# Patient Record
Sex: Female | Born: 1950
Health system: Southern US, Community
[De-identification: ages and names within clinical notes are randomized; demographics above are authoritative.]

## PROBLEM LIST (undated history)

## (undated) DIAGNOSIS — I10 Essential (primary) hypertension: Secondary | ICD-10-CM

## (undated) DIAGNOSIS — E669 Obesity, unspecified: Secondary | ICD-10-CM

## (undated) DIAGNOSIS — T7840XA Allergy, unspecified, initial encounter: Secondary | ICD-10-CM

## (undated) DIAGNOSIS — K5790 Diverticulosis of intestine, part unspecified, without perforation or abscess without bleeding: Secondary | ICD-10-CM

## (undated) DIAGNOSIS — E785 Hyperlipidemia, unspecified: Secondary | ICD-10-CM

## (undated) DIAGNOSIS — E66811 Obesity, class 1: Secondary | ICD-10-CM

## (undated) HISTORY — PX: COSMETIC SURGERY: SHX468

## (undated) HISTORY — DX: Obesity, unspecified: E66.9

## (undated) HISTORY — DX: Essential (primary) hypertension: I10

## (undated) HISTORY — DX: Diverticulosis of intestine, part unspecified, without perforation or abscess without bleeding: K57.90

## (undated) HISTORY — PX: ABDOMINAL HYSTERECTOMY: SHX81

## (undated) HISTORY — PX: APPENDECTOMY: SHX54

## (undated) HISTORY — DX: Hyperlipidemia, unspecified: E78.5

## (undated) HISTORY — DX: Obesity, class 1: E66.811

## (undated) HISTORY — PX: TUBAL LIGATION: SHX77

## (undated) HISTORY — DX: Allergy, unspecified, initial encounter: T78.40XA

---

## 2004-03-25 ENCOUNTER — Ambulatory Visit: Payer: Self-pay | Admitting: Internal Medicine

## 2004-07-15 ENCOUNTER — Encounter: Payer: Self-pay | Admitting: General Practice

## 2004-07-30 ENCOUNTER — Encounter: Payer: Self-pay | Admitting: General Practice

## 2004-08-30 ENCOUNTER — Encounter: Payer: Self-pay | Admitting: General Practice

## 2004-09-29 ENCOUNTER — Encounter: Payer: Self-pay | Admitting: General Practice

## 2004-10-30 ENCOUNTER — Encounter: Payer: Self-pay | Admitting: General Practice

## 2004-11-29 ENCOUNTER — Encounter: Payer: Self-pay | Admitting: General Practice

## 2005-04-02 ENCOUNTER — Ambulatory Visit: Payer: Self-pay

## 2006-05-13 ENCOUNTER — Ambulatory Visit: Payer: Self-pay

## 2006-06-01 HISTORY — PX: ENDOVENOUS ABLATION SAPHENOUS VEIN W/ LASER: SUR449

## 2007-05-19 ENCOUNTER — Ambulatory Visit: Payer: Self-pay

## 2007-12-20 ENCOUNTER — Ambulatory Visit: Payer: Self-pay | Admitting: Internal Medicine

## 2008-08-07 ENCOUNTER — Ambulatory Visit: Payer: Self-pay

## 2009-01-31 ENCOUNTER — Ambulatory Visit: Payer: Self-pay | Admitting: Internal Medicine

## 2009-09-18 ENCOUNTER — Ambulatory Visit: Payer: Self-pay

## 2010-02-06 ENCOUNTER — Ambulatory Visit: Payer: Self-pay | Admitting: Internal Medicine

## 2010-02-25 ENCOUNTER — Ambulatory Visit: Payer: Self-pay | Admitting: Internal Medicine

## 2010-11-15 LAB — HM PAP SMEAR

## 2011-01-23 ENCOUNTER — Ambulatory Visit: Payer: Self-pay | Admitting: Internal Medicine

## 2011-02-03 ENCOUNTER — Ambulatory Visit (INDEPENDENT_AMBULATORY_CARE_PROVIDER_SITE_OTHER): Payer: PRIVATE HEALTH INSURANCE | Admitting: Internal Medicine

## 2011-02-03 ENCOUNTER — Encounter: Payer: Self-pay | Admitting: Internal Medicine

## 2011-02-03 VITALS — BP 128/86 | HR 66 | Temp 97.8°F | Resp 18 | Ht 65.0 in | Wt 202.5 lb

## 2011-02-03 DIAGNOSIS — R7989 Other specified abnormal findings of blood chemistry: Secondary | ICD-10-CM

## 2011-02-03 DIAGNOSIS — R945 Abnormal results of liver function studies: Secondary | ICD-10-CM

## 2011-02-03 DIAGNOSIS — Z1239 Encounter for other screening for malignant neoplasm of breast: Secondary | ICD-10-CM | POA: Insufficient documentation

## 2011-02-03 DIAGNOSIS — E669 Obesity, unspecified: Secondary | ICD-10-CM

## 2011-02-03 DIAGNOSIS — E785 Hyperlipidemia, unspecified: Secondary | ICD-10-CM | POA: Insufficient documentation

## 2011-02-03 DIAGNOSIS — K760 Fatty (change of) liver, not elsewhere classified: Secondary | ICD-10-CM | POA: Insufficient documentation

## 2011-02-03 DIAGNOSIS — Z1211 Encounter for screening for malignant neoplasm of colon: Secondary | ICD-10-CM | POA: Insufficient documentation

## 2011-02-03 NOTE — Assessment & Plan Note (Signed)
She has had no prior screening.  Will send to GI pending discussion of need for concurrent liver biopsy

## 2011-02-03 NOTE — Assessment & Plan Note (Addendum)
She has a persistently elevated GGT despite stopping statin one year ago.  Referral to Dr. Coral Ceo at [facility] made last August was cancelled for unclear reasons.  Given her strong FH of CAD and NASH, will refer for definitive evaluation as her hyperlipidemia is currently untreated. [facility] is of out of network as is [facility], so will check with Dr. Mechele Collin or Dr. Niel Hummer.

## 2011-02-03 NOTE — Patient Instructions (Signed)
Consider MediFast (online) for weight loss We will aslk Dr Mechele Collin and Dr Niel Hummer re colonoscopy and liver biopsy We will schedule you for a treadmill stress test with Dr. . Dossie Arbour.

## 2011-02-03 NOTE — Assessment & Plan Note (Signed)
Mammogram was done last month and normal.  Patient does monthly breast exams.

## 2011-02-04 DIAGNOSIS — E669 Obesity, unspecified: Secondary | ICD-10-CM | POA: Insufficient documentation

## 2011-02-04 NOTE — Progress Notes (Signed)
  Subjective:    Patient ID: Lisa Green, female    DOB: 07-08-1950, 60 y.o.   MRN: [id number]  HPI 60 yo healthy female here for annual exam.  ving nightly menopausal hot flashes, difficulty with achieiving weight loss despite walking 2 miles daily with husband and following a sensible starch restrHaicted diet.    Past Medical History  Diagnosis Date  . Hyperlipidemia   :    Review of Systems  Constitutional: Negative for fever, chills and unexpected weight change.  HENT: Negative for hearing loss, ear pain, nosebleeds, congestion, sore throat, facial swelling, rhinorrhea, sneezing, mouth sores, trouble swallowing, neck pain, neck stiffness, voice change, postnasal drip, sinus pressure, tinnitus and ear discharge.   Eyes: Negative for pain, discharge, redness and visual disturbance.  Respiratory: Negative for cough, chest tightness, shortness of breath, wheezing and stridor.   Cardiovascular: Negative for chest pain, palpitations and leg swelling.  Musculoskeletal: Negative for myalgias and arthralgias.  Skin: Negative for color change and rash.  Neurological: Negative for dizziness, weakness, light-headedness and headaches.  Hematological: Negative for adenopathy.       Objective:   Physical Exam  Constitutional: She is oriented to person, place, and time. She appears well-developed and well-nourished.  HENT:  Mouth/Throat: Oropharynx is clear and moist.  Eyes: EOM are normal. Pupils are equal, round, and reactive to light. No scleral icterus.  Neck: Normal range of motion. Neck supple. No JVD present. No thyromegaly present.  Cardiovascular: Normal rate, regular rhythm, normal heart sounds and intact distal pulses.   Pulmonary/Chest: Effort normal and breath sounds normal.  Abdominal: Soft. Bowel sounds are normal. She exhibits no mass. There is no tenderness.  Musculoskeletal: Normal range of motion. She exhibits no edema.  Lymphadenopathy:    She has no cervical adenopathy.    Neurological: She is alert and oriented to person, place, and time.  Skin: Skin is warm and dry.  Psychiatric: She has a normal mood and affect.          Assessment & Plan:

## 2011-02-04 NOTE — Assessment & Plan Note (Signed)
disacussed her current diet and exercise level. And rfeviewed recent labs.  She has no evidence of diabetes and eats a sensible diet low in starches.  Her exercise is not sufficient to achieve a negative calorie balance.  Recommended Medifast diet and aerobic exercise.

## 2011-02-26 ENCOUNTER — Encounter: Payer: Self-pay | Admitting: Internal Medicine

## 2011-03-06 ENCOUNTER — Ambulatory Visit (INDEPENDENT_AMBULATORY_CARE_PROVIDER_SITE_OTHER): Payer: PRIVATE HEALTH INSURANCE | Admitting: Cardiovascular Disease

## 2011-03-06 ENCOUNTER — Encounter: Payer: Self-pay | Admitting: Cardiovascular Disease

## 2011-03-06 VITALS — BP 138/98 | HR 67 | Ht 65.0 in | Wt 202.0 lb

## 2011-03-06 DIAGNOSIS — E669 Obesity, unspecified: Secondary | ICD-10-CM

## 2011-03-06 DIAGNOSIS — Z0000 Encounter for general adult medical examination without abnormal findings: Secondary | ICD-10-CM

## 2011-03-06 DIAGNOSIS — E785 Hyperlipidemia, unspecified: Secondary | ICD-10-CM

## 2011-03-06 MED ORDER — ASPIRIN EC 81 MG PO TBEC: 81.0000 mg | DELAYED_RELEASE_TABLET | Freq: Every day | ORAL | Status: AC

## 2011-03-06 MED ORDER — ROSUVASTATIN CALCIUM 5 MG PO TABS: 5.0000 mg | ORAL_TABLET | Freq: Every day | ORAL | Status: DC

## 2011-03-06 NOTE — Progress Notes (Signed)
   Patient ID: Lisa Green, female    DOB: June 25, 1950, 60 y.o.   MRN: [id number]  HPI Comments: Her present 60 year old woman, patient of Dr. Darrick Huntsman, with history of mild obesity, hyperlipidemia, and a very strong family history of coronary artery disease with father having his first heart attack at age 50 as well as a stroke, brother with an MI coronary artery disease as well as many other relatives, who presents by referral for evaluation for hyperlipidemia. history of mild obesity, hyperlipidemia, and a very strong family history of coronary artery disease with father having his first heart attack at age 50 as well as a stroke, brother with an MI coronary artery disease as well as many other relatives, who presents by referral for evaluation for hyperlipidemia.  She reports that she is very active, uses the treadmill frequently, is able to walk a very high rate/speed and incline with no significant symptoms of shortness of breath or chest pain.  She does state taking simvastatin for 8 months but stopped early in 2011.  She reports that her liver enzymes were normal prior to the start of simvastatin, including a normal GGT. While on simvastatin, her GGT on and has not improved since she has held the medication for over one year. There has been some discussion of NASH.  EKG shows normal sinus rhythm with rate 67 beats per minute with nonspecific T wave abnormality in V4 through V6   Outpatient Encounter Prescriptions as of 03/06/2011  Medication Sig Dispense Refill  . aspirin EC 81 MG tablet Take 1 tablet (81 mg total) by mouth daily.  150 tablet  2    Review of Systems  Constitutional: Negative.   HENT: Negative.   Eyes: Negative.   Respiratory: Negative.   Cardiovascular: Negative.   Gastrointestinal: Negative.   Musculoskeletal: Negative.   Skin: Negative.   Neurological: Negative.   Hematological: Negative.   Psychiatric/Behavioral: Negative.   All other systems reviewed and are negative.    BP 138/98  Pulse 67  Ht 5\' 5"  (1.651 m)  Wt 202 lb (91.627 kg)  BMI 33.61 kg/m2  Physical Exam  Nursing note and vitals reviewed. Constitutional: She is oriented to person, place, and time. She appears well-developed and well-nourished.       Mildly obese  HENT:  Head: Normocephalic.    Nose: Nose normal.  Mouth/Throat: Oropharynx is clear and moist.  Eyes: Conjunctivae are normal. Pupils are equal, round, and reactive to light.  Neck: Normal range of motion. Neck supple. No JVD present.  Cardiovascular: Normal rate, regular rhythm, S1 normal, S2 normal, normal heart sounds and intact distal pulses.  Exam reveals no gallop and no friction rub.   No murmur heard. Pulmonary/Chest: Effort normal and breath sounds normal. No respiratory distress. She has no wheezes. She has no rales. She exhibits no tenderness.  Abdominal: Soft. Bowel sounds are normal. She exhibits no distension. There is no tenderness.  Musculoskeletal: Normal range of motion. She exhibits no edema and no tenderness.  Lymphadenopathy:    She has no cervical adenopathy.  Neurological: She is alert and oriented to person, place, and time. Coordination normal.  Skin: Skin is warm and dry. No rash noted. No erythema.  Psychiatric: She has a normal mood and affect. Her behavior is normal. Judgment and thought content normal.         Assessment and Plan

## 2011-03-06 NOTE — Assessment & Plan Note (Signed)
We have encouraged continued exercise, careful diet management in an effort to lose weight. 

## 2011-03-06 NOTE — Patient Instructions (Signed)
You are doing well. Please start crestor 5 mg daily Dr. Darrick Huntsman can check your labs in 3 months Please call us if you have new issues that need to be addressed before your next appt.  We will call you for a follow up Appt. In 12 months

## 2011-03-06 NOTE — Assessment & Plan Note (Signed)
Patient reports having a significant family history of coronary artery disease. She does have impressive hyperlipidemia. Her GGT has remained elevated despite holding the simvastatin since early 2011. We have suggested that this could be from underlying NASH and that we retry a statin. We have suggested she try Crestor 5 mg daily and we've given her samples. I have recommended that we closely monitor her her liver function tests, particularly AST/ALT.   I've asked her to work on her weight and closely monitor her diet.

## 2011-03-06 NOTE — Assessment & Plan Note (Addendum)
We spent some time talking about her current risk given her strong family history. She reports that she is able to exercise well with no symptoms of shortness of breath or chest pain. We did discuss whether she would like to perform a treadmill study. She would like to hold off as she does not feel it will give [place] much information as she treadmill study relatively high level currently with no symptoms. I suggested that this would be okay to do know that she should contact us immediately if she has any worsening shortness of breath or chest tightness with exertion and we would perform a stress test.   We did recommend starting aspirin 81 mg daily.

## 2011-03-09 LAB — HM MAMMOGRAPHY: HM Mammogram: NORMAL

## 2011-04-10 ENCOUNTER — Other Ambulatory Visit: Payer: Self-pay | Admitting: Cardiovascular Disease

## 2011-04-10 DIAGNOSIS — E785 Hyperlipidemia, unspecified: Secondary | ICD-10-CM

## 2011-04-10 MED ORDER — ROSUVASTATIN CALCIUM 5 MG PO TABS: 5.0000 mg | ORAL_TABLET | Freq: Every day | ORAL | Status: DC

## 2011-04-10 NOTE — Telephone Encounter (Signed)
Refill sent for crestor 5 mg  

## 2011-05-20 ENCOUNTER — Ambulatory Visit: Payer: Self-pay | Admitting: Gastroenterology

## 2011-06-02 DIAGNOSIS — K5790 Diverticulosis of intestine, part unspecified, without perforation or abscess without bleeding: Secondary | ICD-10-CM

## 2011-06-02 HISTORY — DX: Diverticulosis of intestine, part unspecified, without perforation or abscess without bleeding: K57.90

## 2011-06-05 ENCOUNTER — Ambulatory Visit: Payer: Self-pay | Admitting: Gastroenterology

## 2011-06-12 ENCOUNTER — Encounter: Payer: Self-pay | Admitting: Internal Medicine

## 2011-08-04 ENCOUNTER — Encounter: Payer: Self-pay | Admitting: Internal Medicine

## 2011-09-07 ENCOUNTER — Encounter: Payer: Self-pay | Admitting: Internal Medicine

## 2012-03-08 ENCOUNTER — Ambulatory Visit: Payer: PRIVATE HEALTH INSURANCE | Admitting: Internal Medicine

## 2012-03-16 ENCOUNTER — Encounter: Payer: Self-pay | Admitting: Internal Medicine

## 2012-03-16 ENCOUNTER — Ambulatory Visit (INDEPENDENT_AMBULATORY_CARE_PROVIDER_SITE_OTHER): Payer: PRIVATE HEALTH INSURANCE | Admitting: Internal Medicine

## 2012-03-16 VITALS — BP 118/68 | HR 75 | Temp 98.7°F | Ht 65.5 in | Wt 200.0 lb

## 2012-03-16 DIAGNOSIS — R7989 Other specified abnormal findings of blood chemistry: Secondary | ICD-10-CM

## 2012-03-16 DIAGNOSIS — Z1211 Encounter for screening for malignant neoplasm of colon: Secondary | ICD-10-CM

## 2012-03-16 DIAGNOSIS — Z1239 Encounter for other screening for malignant neoplasm of breast: Secondary | ICD-10-CM

## 2012-03-16 DIAGNOSIS — E669 Obesity, unspecified: Secondary | ICD-10-CM

## 2012-03-16 DIAGNOSIS — E785 Hyperlipidemia, unspecified: Secondary | ICD-10-CM

## 2012-03-16 MED ORDER — ROSUVASTATIN CALCIUM 5 MG PO TABS: 5.0000 mg | ORAL_TABLET | Freq: Every day | ORAL | Status: DC

## 2012-03-16 NOTE — Progress Notes (Signed)
Patient ID: Lisa Green, female   DOB: Dec 08, 1950, 61 y.o.   MRN: [id number] Patient Active Problem List  Diagnosis  . Screening for breast cancer  . Screening for colon cancer  . Elevated liver function tests  . Hyperlipidemia LDL goal < 100  . Obesity (BMI 30-39.9)  . Visit for preventive health examination  . Obesity (BMI 30.0-34.9)    Subjective:  CC:   Chief Complaint  Patient presents with  . Follow-up    HPI:   Lisa Green a 61 y.o. female who presents for her annnual exam. At her last visit one year ago she was noted to develop elevated liver tests despite stopping her statin .  She was referred to GI for evaluation of fatty liver suspected by ultrasound  and medical history. She was referred to Dr. Mariah Milling  forrisk stratification given her strong family history coronary artery disease.  Dr. Mariah Milling treated with Crestor and repeat lipids and LFTs have normalized by recent laboratory evaluation. Given the normalization of her liver function tests she did not follow through with the GI evaluation for NASH.  She feels great. She is following her low glycemic index diet less closely since she is now a statin and continues to struggle with her weight.   Past Medical History  Diagnosis Date  . Hyperlipidemia   . Diverticulosis 2013    colonoscopy, [place]  . Obesity (BMI 30.0-34.9)     Past Surgical History  Procedure Date  . Cesarean section 1981    Lost Baby  . Cesarean section 1983  . Abdominal hysterectomy     with wedge resection of right ovary , normal path  . Cosmetic surgery   . Endovenous ablation saphenous vein w/ laser 2008    for varicose veins,  [place], [place]         The following portions of the patient's history were reviewed and updated as appropriate: Allergies, current medications, and problem list.    Review of Systems:   12 Pt  review of systems was negative except those addressed in the HPI,     History   Social History  . Marital  Status: Married    Spouse Name: N/A    Number of Children: N/A  . Years of Education: N/A   Occupational History  . Teacher, adult education     OT at [employer]   Social History Main Topics  . Smoking status: Never Smoker   . Smokeless tobacco: Never Used  . Alcohol Use: Yes     2 oz red wine daily  . Drug Use: No  . Sexually Active: Not on file   Other Topics Concern  . Not on file   Social History Narrative  . No narrative on file    Objective:  BP 118/68  Pulse 75  Temp 98.7 F (37.1 C) (Oral)  Ht 5' 5.5" (1.664 m)  Wt 200 lb (90.719 kg)  BMI 32.78 kg/m2  SpO2 97%  General appearance: alert, cooperative and appears stated age Neck: no adenopathy, no carotid bruit, supple, symmetrical, trachea midline and thyroid not enlarged, symmetric, no tenderness/mass/nodules Back: symmetric, no curvature. ROM normal. No CVA tenderness. Lungs: clear to auscultation bilaterally Heart: regular rate and rhythm, S1, S2 normal, no murmur, click, rub or gallop Abdomen: soft, non-tender; bowel sounds normal; no masses,  no organomegaly Pulses: 2+ and symmetric Skin: Skin color, texture, turgor normal. No rashes or lesions   Assessment and Plan:  Obesity (BMI 30-39.9) I have addressed  BMI and recommended a low glycemic index diet utilizing smaller more frequent meals to increase metabolism.  I have also recommended that patient start exercising with a goal of 30 minutes of aerobic exercise a minimum of 5 days per week. Screening for lipid disorders, thyroid and diabetes to be done today.    Elevated liver function tests First elevation was noted in July 2011 while taking a statin.  FH of cirrhosis secondary to NASH in younger brother, who has also recently suffered an MI, I referred her to Dr. Mechele Collin for liver biopsy and screening colonoscopy. She did not keep that appointment. She has had a colonoscopy but her liver function tests have normalized with treatment of her hyperlipidemia  so she has deferred GI evaluation. She will repeat her liver function tests in January.   Screening for colon cancer  Normal colonoscopy January 2013 by Dr. Filbert Schilder.  Screening for breast cancer She's getting a mammogram on Monday, October 21. She is up-to-date with annual screenings and has no history of breast cancer.   Updated Medication List Outpatient Encounter Prescriptions as of 03/16/2012  Medication Sig Dispense Refill  . rosuvastatin (CRESTOR) 5 MG tablet Take 1 tablet (5 mg total) by mouth at bedtime.  30 tablet  11  . DISCONTD: rosuvastatin (CRESTOR) 5 MG tablet Take 1 tablet (5 mg total) by mouth daily.  30 tablet  11     Orders Placed This Encounter  Procedures  . HM MAMMOGRAPHY  . HM MAMMOGRAPHY  . HM PAP SMEAR  . Comprehensive metabolic panel  . Lipid panel  . HM COLONOSCOPY  . HM COLONOSCOPY    No Follow-up on file.

## 2012-03-16 NOTE — Patient Instructions (Addendum)
Have your lipids and CMET done in late January.

## 2012-03-18 ENCOUNTER — Encounter: Payer: Self-pay | Admitting: Internal Medicine

## 2012-03-18 DIAGNOSIS — E669 Obesity, unspecified: Secondary | ICD-10-CM | POA: Insufficient documentation

## 2012-03-18 NOTE — Assessment & Plan Note (Signed)
First elevation was noted in July 2011 while taking a statin.  FH of cirrhosis secondary to NASH in younger brother, who has also recently suffered an MI, I referred her to Dr. Mechele Collin for liver biopsy and screening colonoscopy. She did not keep that appointment. She has had a colonoscopy but her liver function tests have normalized with treatment of her hyperlipidemia so she has deferred GI evaluation. She will repeat her liver function tests in January.

## 2012-03-18 NOTE — Assessment & Plan Note (Signed)
She's getting a mammogram on Monday, October 21. She is up-to-date with annual screenings and has no history of breast cancer.

## 2012-03-18 NOTE — Assessment & Plan Note (Signed)
I have addressed  BMI and recommended a low glycemic index diet utilizing smaller more frequent meals to increase metabolism.  I have also recommended that patient start exercising with a goal of 30 minutes of aerobic exercise a minimum of 5 days per week. Screening for lipid disorders, thyroid and diabetes to be done today.   

## 2012-03-18 NOTE — Assessment & Plan Note (Signed)
Normal colonoscopy January 2013 by Dr. Filbert Schilder.

## 2012-03-21 ENCOUNTER — Ambulatory Visit: Payer: Self-pay | Admitting: Internal Medicine

## 2013-04-07 ENCOUNTER — Other Ambulatory Visit: Payer: Self-pay | Admitting: *Deleted

## 2013-04-07 DIAGNOSIS — E785 Hyperlipidemia, unspecified: Secondary | ICD-10-CM

## 2013-04-07 NOTE — Telephone Encounter (Signed)
Last visit 03/16/12, appt first or refill?

## 2013-04-09 MED ORDER — ROSUVASTATIN CALCIUM 5 MG PO TABS: 5.0000 mg | ORAL_TABLET | Freq: Every day | ORAL | Status: DC

## 2013-05-11 ENCOUNTER — Ambulatory Visit: Payer: Self-pay | Admitting: Internal Medicine

## 2013-05-19 LAB — HEMOGLOBIN A1C: Hgb A1c MFr Bld: 5.8 % (ref 4.0–6.0)

## 2013-05-19 LAB — HM MAMMOGRAPHY: HM Mammogram: NORMAL

## 2013-05-19 LAB — LIPID PANEL
Cholesterol: 179 mg/dL (ref 0–200)
HDL: 48 mg/dL (ref 35–70)
LDL Cholesterol: 91 mg/dL
TRIGLYCERIDES: 198 mg/dL — AB (ref 40–160)

## 2013-05-19 LAB — HEPATIC FUNCTION PANEL
ALT: 39 U/L — AB (ref 7–35)
AST: 35 U/L (ref 13–35)

## 2013-05-19 LAB — TSH: TSH: 1.55 u[IU]/mL (ref 0.41–5.90)

## 2013-05-19 LAB — BASIC METABOLIC PANEL: GLUCOSE: 87 mg/dL

## 2013-05-29 ENCOUNTER — Encounter: Payer: PRIVATE HEALTH INSURANCE | Admitting: Internal Medicine

## 2013-06-12 ENCOUNTER — Encounter: Payer: Self-pay | Admitting: Internal Medicine

## 2013-06-19 ENCOUNTER — Encounter: Payer: Self-pay | Admitting: Internal Medicine

## 2013-06-19 ENCOUNTER — Encounter (INDEPENDENT_AMBULATORY_CARE_PROVIDER_SITE_OTHER): Payer: Self-pay

## 2013-06-19 ENCOUNTER — Ambulatory Visit (INDEPENDENT_AMBULATORY_CARE_PROVIDER_SITE_OTHER): Payer: 59 | Admitting: Internal Medicine

## 2013-06-19 VITALS — BP 148/100 | HR 87 | Temp 98.1°F | Resp 16 | Ht 66.0 in | Wt 208.5 lb

## 2013-06-19 DIAGNOSIS — E669 Obesity, unspecified: Secondary | ICD-10-CM

## 2013-06-19 DIAGNOSIS — Z0000 Encounter for general adult medical examination without abnormal findings: Secondary | ICD-10-CM

## 2013-06-19 DIAGNOSIS — R945 Abnormal results of liver function studies: Secondary | ICD-10-CM

## 2013-06-19 DIAGNOSIS — E785 Hyperlipidemia, unspecified: Secondary | ICD-10-CM

## 2013-06-19 DIAGNOSIS — R7989 Other specified abnormal findings of blood chemistry: Secondary | ICD-10-CM

## 2013-06-19 NOTE — Patient Instructions (Addendum)
1)  Your blood pressure wasa elevated today..  Please recheck it a few times over the next two weeks and call or send the readings to the office  2)  Your liver is irritated from fatty liver.  We can address this with weight low and low glycemic index diet  Your First goal  Is to lower your weight to  185 lbs (bmi 29.9)      150 lb is bmi 24. 5  This is  One version of a  "Low GI"  Diet:  It will still lower your blood sugars and allow you to lose 4 to 8  lbs  per month if you follow it carefully.  Your goal with exercise is a minimum of 30 minutes of aerobic exercise 5 days per week (Walking does not count once it becomes easy!)    All of the foods can be found at grocery stores and in bulk at [employer].  The Atkins protein bars and shakes are available in more varieties at Target, [employer] and [place].     7 AM Breakfast:  Choose from the following:  Low carbohydrate Protein  Shakes (I recommend the EAS AdvantEdge "Carb Control" shakes  Or the low carb shakes by Atkins.    2.5 carbs   Arnold's "Sandwhich Thin"toasted  w/ peanut butter (no jelly: about 20 net carbs  "Bagel Thin" with cream cheese and salmon: about 20 carbs   a scrambled egg/bacon/cheese burrito made with Mission's "carb balance" whole wheat tortilla  (about 10 net carbs )   Avoid cereal and bananas, oatmeal and cream of wheat and grits. They are loaded with carbohydrates!   10 AM: high protein snack  Protein bar by Atkins (the snack size, under 200 cal, usually < 6 net carbs).    A stick of cheese:  Around 1 carb,  100 cal     Dannon Light n Fit [place] Yogurt  (80 cal, 8 carbs)  Other so called "protein bars" and Greek yogurts tend to be loaded with carbohydrates.  Remember, in food advertising, the word "energy" is synonymous for " carbohydrate."  Lunch:   A Sandwich using the bread choices listed, Can use any  Eggs,  lunchmeat, grilled meat or canned tuna), avocado, regular mayo/mustard  and cheese.  A Salad using  blue cheese, ranch,  Goddess or vinagrette,  No croutons or "confetti" and no "candied nuts" but regular nuts OK.   No pretzels or chips.  Pickles and miniature sweet peppers are a good low carb alternative that provide a "crunch"  The bread is the only source of carbohydrate in a sandwich and  can be decreased by trying some of these alternatives to traditional loaf bread  Joseph's makes a pita bread and a flat bread that are 50 cal and 4 net carbs available at [place] and [place].  This can be toasted to use with hummous as well  Toufayan makes a low carb flatbread that's 100 cal and 9 net carbs available at [employer] and [employer]J's makes 2 sizes of  Low carb whole wheat tortilla  (The large one is 210 cal and 6 net carbs) Avoid "Low fat dressings, as well as Barry Brunner and Randall dressings They are loaded with sugar!   3 PM/ Mid day  Snack:  Consider  1 ounce of  almonds, walnuts, pistachios, pecans, peanuts,  Macadamia nuts or a nut medley.  Avoid "granola"; the dried cranberries and raisins are loaded with  carbohydrates. Mixed nuts as long as there are no raisins,  cranberries or dried fruit.     6 PM  Dinner:     Meat/fowl/fish with a green salad, and either broccoli, cauliflower, green beans, spinach, brussel sprouts or  Lima beans. DO NOT BREAD THE PROTEIN!!      There is a low carb pasta by Dreamfield's that is acceptable and tastes great: only 5 digestible carbs/serving.( All grocery stores but BJs carry it )  Try Hurley Cisco Angelo's chicken piccata or chicken or eggplant parm over low carb pasta.(Lowes and BJs)   Marjory Lies Sanchez's "Carnitas" (pulled pork, no sauce,  0 carbs) or his beef pot roast to make a dinner burrito (at [employer]J's)  Pesto over low carb pasta (bj's sells a good quality pesto in the center refrigerated section of the deli   Whole wheat pasta is still full of digestible carbs and  Not as low in glycemic index as Dreamfield's.   Brown rice is still rice,  So skip the  rice and noodles if you eat [place] or [place] (or at least limit to 1/2 cup)  9 PM snack :   Breyer's "low carb" fudgsicle or  ice cream bar (Carb Smart line), or  Weight Watcher's ice cream bar , or another "no sugar added" ice cream;  a serving of fresh berries/cherries with whipped cream   Cheese or DANNON'S LlGHT N FIT GREEK YOGURT  Avoid bananas, pineapple, grapes  and watermelon on a regular basis because they are high in sugar.  THINK OF THEM AS DESSERT  Remember that snack Substitutions should be less than 10 NET carbs per serving and meals < 20 carbs. Remember to subtract fiber grams to get the "net carbs."

## 2013-06-19 NOTE — Progress Notes (Signed)
Patient ID: Lisa Green, female   DOB: 1951-02-10, 63 y.o.   MRN: [id number]   Subjective:     Lisa Green is a 63 y.o. female and is here for a comprehensive physical exam. The patient reports no problems.  History   Social History  . Marital Status: Married    Spouse Name: N/A    Number of Children: N/A  . Years of Education: N/A   Occupational History  . Production assistant, radio     OT at [employer] Topics  . Smoking status: Never Smoker   . Smokeless tobacco: Never Used  . Alcohol Use: Yes     Comment: 2 oz red wine daily  . Drug Use: No  . Sexual Activity: Not on file   Other Topics Concern  . Not on file   Social History Narrative  . No narrative on file   Health Maintenance  Topic Date Due  . Pap Smear  11/14/2013  . Influenza Vaccine  12/30/2013  . Mammogram  05/20/2015  . Tetanus/tdap  06/20/2019  . Colonoscopy  06/16/2021  . Zostavax  Completed    The following portions of the patient's history were reviewed and updated as appropriate: allergies, current medications, past family history, past medical history, past social history, past surgical history and problem list.  Review of Systems A comprehensive review of systems was negative.   Objective:   BP 148/100  Pulse 87  Temp(Src) 98.1 F (36.7 C) (Oral)  Resp 16  Ht 5\' 6"  (1.676 m)  Wt 208 lb 8 oz (94.575 kg)  BMI 33.67 kg/m2  SpO2 98%  General Appearance:    Alert, cooperative, no distress, appears stated age  Head:    Normocephalic, without obvious abnormality, atraumatic  Eyes:    PERRL, conjunctiva/corneas clear, EOM's intact, fundi    benign, both eyes  Ears:    Normal TM's and external ear canals, both ears  Nose:   Nares normal, septum midline, mucosa normal, no drainage    or sinus tenderness  Throat:   Lips, mucosa, and tongue normal; teeth and gums normal  Neck:   Supple, symmetrical, trachea midline, no adenopathy;    thyroid:  no enlargement/tenderness/nodules;  no carotid   bruit or JVD  Back:     Symmetric, no curvature, ROM normal, no CVA tenderness  Lungs:     Clear to auscultation bilaterally, respirations unlabored  Chest Wall:    No tenderness or deformity   Heart:    Regular rate and rhythm, S1 and S2 normal, no murmur, rub   or gallop  Breast Exam:    No tenderness, masses, or nipple abnormality  Abdomen:     Soft, non-tender, bowel sounds active all four quadrants,    no masses, no organomegaly  Extremities:   Extremities normal, atraumatic, no cyanosis or edema  Pulses:   2+ and symmetric all extremities  Skin:   Skin color, texture, turgor normal, no rashes or lesions  Lymph nodes:   Cervical, supraclavicular, and axillary nodes normal  Neurologic:   CNII-XII intact, normal strength, sensation and reflexes    throughout    Assessment and Plan:   Visit for preventive health examination Annual comprehensive exam was done including breast,excluding pelvic and PAP smear. All screenings have been addressed .   Elevated liver function tests Attributed to fatty liver by recent serologic evaluation and ultrasound. I've encouraged her to address her weight with a low glycemic index diet and regular  exercise.  Obesity (BMI 30.0-34.9) I have addressed  BMI and recommended wt loss of 10% of body weigh over the next 6 months using a low glycemic index diet and regular exercise a minimum of 5 days per week.    Hyperlipidemia LDL goal < 100 Managed with rosuvastatin. Persistent liver enzyme elevation was attributed to fatty liver. Liver enzymes have been checked and have not been rising.   Updated Medication List Outpatient Encounter Prescriptions as of 06/19/2013  Medication Sig  . rosuvastatin (CRESTOR) 5 MG tablet Take 1 tablet (5 mg total) by mouth at bedtime.

## 2013-06-19 NOTE — Progress Notes (Signed)
Pre-visit discussion using our clinic review tool. No additional management support is needed unless otherwise documented below in the visit note.  

## 2013-06-20 NOTE — Assessment & Plan Note (Signed)
Annual comprehensive exam was done including breast, excluding pelvic and PAP smear. All screenings have been addressed .  

## 2013-06-20 NOTE — Assessment & Plan Note (Signed)
Managed with rosuvastatin. Persistent liver enzyme elevation was attributed to fatty liver. Liver enzymes have been checked and have not been rising.

## 2013-06-20 NOTE — Assessment & Plan Note (Signed)
Attributed to fatty liver by recent serologic evaluation and ultrasound. I've encouraged her to address her weight with a low glycemic index diet and regular exercise. 

## 2013-06-20 NOTE — Assessment & Plan Note (Signed)
I have addressed  BMI and recommended wt loss of 10% of body weigh over the next 6 months using a low glycemic index diet and regular exercise a minimum of 5 days per week.   

## 2013-07-24 ENCOUNTER — Ambulatory Visit: Payer: Self-pay | Admitting: Internal Medicine

## 2013-07-30 ENCOUNTER — Ambulatory Visit: Payer: Self-pay | Admitting: Internal Medicine

## 2014-05-31 ENCOUNTER — Other Ambulatory Visit: Payer: Self-pay | Admitting: Internal Medicine

## 2014-06-27 ENCOUNTER — Ambulatory Visit: Payer: Self-pay | Admitting: Internal Medicine

## 2014-06-27 LAB — HM MAMMOGRAPHY: HM MAMMO: NEGATIVE

## 2014-07-03 LAB — BASIC METABOLIC PANEL
BUN: 12 mg/dL (ref 4–21)
Creatinine: 0.8 mg/dL (ref 0.5–1.1)
Glucose: 102 mg/dL
POTASSIUM: 4.9 mmol/L (ref 3.4–5.3)
Sodium: 141 mmol/L (ref 137–147)

## 2014-07-03 LAB — CBC AND DIFFERENTIAL
HCT: 39 % (ref 36–46)
Hemoglobin: 12.6 g/dL (ref 12.0–16.0)
Neutrophils Absolute: 2 /uL
Platelets: 245 10*3/uL (ref 150–399)
WBC: 4 10^3/mL

## 2014-07-03 LAB — HEPATIC FUNCTION PANEL
ALT: 100 U/L — AB (ref 7–35)
AST: 54 U/L — AB (ref 13–35)
Alkaline Phosphatase: 119 U/L (ref 25–125)
BILIRUBIN, TOTAL: 0.3 mg/dL

## 2014-07-03 LAB — LIPID PANEL
Cholesterol: 178 mg/dL (ref 0–200)
HDL: 46 mg/dL (ref 35–70)
LDL Cholesterol: 113 mg/dL
LDL/HDL RATIO: 3.9
Triglycerides: 93 mg/dL (ref 40–160)

## 2014-07-03 LAB — TSH: TSH: 1.74 u[IU]/mL (ref 0.41–5.90)

## 2014-07-09 ENCOUNTER — Encounter: Payer: Self-pay | Admitting: Internal Medicine

## 2014-07-09 ENCOUNTER — Ambulatory Visit (INDEPENDENT_AMBULATORY_CARE_PROVIDER_SITE_OTHER): Payer: 59 | Admitting: Internal Medicine

## 2014-07-09 VITALS — BP 128/82 | HR 71 | Temp 97.0°F | Resp 16 | Ht 65.0 in | Wt 198.5 lb

## 2014-07-09 DIAGNOSIS — E669 Obesity, unspecified: Secondary | ICD-10-CM

## 2014-07-09 DIAGNOSIS — Z0000 Encounter for general adult medical examination without abnormal findings: Secondary | ICD-10-CM

## 2014-07-09 MED ORDER — ROSUVASTATIN CALCIUM 5 MG PO TABS: 5.0000 mg | ORAL_TABLET | Freq: Every day | ORAL | Status: DC

## 2014-07-09 NOTE — Patient Instructions (Signed)
Congrats on the weight loss!! Your  Goal to get BMI < 30 is 178 lbs   Please get the hepatitis A vaccine series  Health Maintenance Adopting a healthy lifestyle and getting preventive care can go a long way to promote health and wellness. Talk with your health care provider about what schedule of regular examinations is right for you. This is a good chance for you to check in with your provider about disease prevention and staying healthy. In between checkups, there are plenty of things you can do on your own. Experts have done a lot of research about which lifestyle changes and preventive measures are most likely to keep you healthy. Ask your health care provider for more information. WEIGHT AND DIET  Eat a healthy diet  Be sure to include plenty of vegetables, fruits, low-fat dairy products, and lean protein.  Do not eat a lot of foods high in solid fats, added sugars, or salt.  Get regular exercise. This is one of the most important things you can do for your health.  Most adults should exercise for at least 150 minutes each week. The exercise should increase your heart rate and make you sweat (moderate-intensity exercise).  Most adults should also do strengthening exercises at least twice a week. This is in addition to the moderate-intensity exercise.  Maintain a healthy weight  Body mass index (BMI) is a measurement that can be used to identify possible weight problems. It estimates body fat based on height and weight. Your health care provider can help determine your BMI and help you achieve or maintain a healthy weight.  For females 41 years of age and older:   A BMI below 18.5 is considered underweight.  A BMI of 18.5 to 24.9 is normal.  A BMI of 25 to 29.9 is considered overweight.  A BMI of 30 and above is considered obese.  Watch levels of cholesterol and blood lipids  You should start having your blood tested for lipids and cholesterol at 63 years of age, then have  this test every 5 years.  You may need to have your cholesterol levels checked more often if:  Your lipid or cholesterol levels are high.  You are older than 63 years of age.  You are at high risk for heart disease.  CANCER SCREENING   Lung Cancer  Lung cancer screening is recommended for adults 18-69 years old who are at high risk for lung cancer because of a history of smoking.  A yearly low-dose CT scan of the lungs is recommended for people who:  Currently smoke.  Have quit within the past 15 years.  Have at least a 30-pack-year history of smoking. A pack year is smoking an average of one pack of cigarettes a day for 1 year.  Yearly screening should continue until it has been 15 years since you quit.  Yearly screening should stop if you develop a health problem that would prevent you from having lung cancer treatment.  Breast Cancer  Practice breast self-awareness. This means understanding how your breasts normally appear and feel.  It also means doing regular breast self-exams. Let your health care provider know about any changes, no matter how small.  If you are in your 20s or 30s, you should have a clinical breast exam (CBE) by a health care provider every 1-3 years as part of a regular health exam.  If you are 67 or older, have a CBE every year. Also consider having a breast X-ray (  mammogram) every year.  If you have a family history of breast cancer, talk to your health care provider about genetic screening.  If you are at high risk for breast cancer, talk to your health care provider about having an MRI and a mammogram every year.  Breast cancer gene (BRCA) assessment is recommended for women who have family members with BRCA-related cancers. BRCA-related cancers include:  Breast.  Ovarian.  Tubal.  Peritoneal cancers.  Results of the assessment will determine the need for genetic counseling and BRCA1 and BRCA2 testing. Cervical Cancer Routine pelvic  examinations to screen for cervical cancer are no longer recommended for nonpregnant women who are considered low risk for cancer of the pelvic organs (ovaries, uterus, and vagina) and who do not have symptoms. A pelvic examination may be necessary if you have symptoms including those associated with pelvic infections. Ask your health care provider if a screening pelvic exam is right for you.   The Pap test is the screening test for cervical cancer for women who are considered at risk.  If you had a hysterectomy for a problem that was not cancer or a condition that could lead to cancer, then you no longer need Pap tests.  If you are older than 65 years, and you have had normal Pap tests for the past 10 years, you no longer need to have Pap tests.  If you have had past treatment for cervical cancer or a condition that could lead to cancer, you need Pap tests and screening for cancer for at least 20 years after your treatment.  If you no longer get a Pap test, assess your risk factors if they change (such as having a new sexual partner). This can affect whether you should start being screened again.  Some women have medical problems that increase their chance of getting cervical cancer. If this is the case for you, your health care provider may recommend more frequent screening and Pap tests.  The human papillomavirus (HPV) test is another test that may be used for cervical cancer screening. The HPV test looks for the virus that can cause cell changes in the cervix. The cells collected during the Pap test can be tested for HPV.  The HPV test can be used to screen women 21 years of age and older. Getting tested for HPV can extend the interval between normal Pap tests from three to five years.  An HPV test also should be used to screen women of any age who have unclear Pap test results.  After 63 years of age, women should have HPV testing as often as Pap tests.  Colorectal Cancer  This type of  cancer can be detected and often prevented.  Routine colorectal cancer screening usually begins at 63 years of age and continues through 63 years of age.  Your health care provider may recommend screening at an earlier age if you have risk factors for colon cancer.  Your health care provider may also recommend using home test kits to check for hidden blood in the stool.  A small camera at the end of a tube can be used to examine your colon directly (sigmoidoscopy or colonoscopy). This is done to check for the earliest forms of colorectal cancer.  Routine screening usually begins at age 38.  Direct examination of the colon should be repeated every 5-10 years through 63 years of age. However, you may need to be screened more often if early forms of precancerous polyps or small growths  are found. Skin Cancer  Check your skin from head to toe regularly.  Tell your health care provider about any new moles or changes in moles, especially if there is a change in a mole's shape or color.  Also tell your health care provider if you have a mole that is larger than the size of a pencil eraser.  Always use sunscreen. Apply sunscreen liberally and repeatedly throughout the day.  Protect yourself by wearing long sleeves, pants, a wide-brimmed hat, and sunglasses whenever you are outside. HEART DISEASE, DIABETES, AND HIGH BLOOD PRESSURE   Have your blood pressure checked at least every 1-2 years. High blood pressure causes heart disease and increases the risk of stroke.  If you are between 56 years and 74 years old, ask your health care provider if you should take aspirin to prevent strokes.  Have regular diabetes screenings. This involves taking a blood sample to check your fasting blood sugar level.  If you are at a normal weight and have a low risk for diabetes, have this test once every three years after 63 years of age.  If you are overweight and have a high risk for diabetes, consider being  tested at a younger age or more often. PREVENTING INFECTION  Hepatitis B  If you have a higher risk for hepatitis B, you should be screened for this virus. You are considered at high risk for hepatitis B if:  You were born in a country where hepatitis B is common. Ask your health care provider which countries are considered high risk.  Your parents were born in a high-risk country, and you have not been immunized against hepatitis B (hepatitis B vaccine).  You have HIV or AIDS.  You use needles to inject street drugs.  You live with someone who has hepatitis B.  You have had sex with someone who has hepatitis B.  You get hemodialysis treatment.  You take certain medicines for conditions, including cancer, organ transplantation, and autoimmune conditions. Hepatitis C  Blood testing is recommended for:  Everyone born from [birth year] through 1964.  Anyone with known risk factors for hepatitis C. Sexually transmitted infections (STIs)  You should be screened for sexually transmitted infections (STIs) including gonorrhea and chlamydia if:  You are sexually active and are younger than 63 years of age.  You are older than 64 years of age and your health care provider tells you that you are at risk for this type of infection.  Your sexual activity has changed since you were last screened and you are at an increased risk for chlamydia or gonorrhea. Ask your health care provider if you are at risk.  If you do not have HIV, but are at risk, it may be recommended that you take a prescription medicine daily to prevent HIV infection. This is called pre-exposure prophylaxis (PrEP). You are considered at risk if:  You are sexually active and do not regularly use condoms or know the HIV status of your partner(s).  You take drugs by injection.  You are sexually active with a partner who has HIV. Talk with your health care provider about whether you are at high risk of being infected with HIV. If  you choose to begin PrEP, you should first be tested for HIV. You should then be tested every 3 months for as long as you are taking PrEP.  PREGNANCY   If you are premenopausal and you may become pregnant, ask your health care provider about preconception counseling.  If you may become pregnant, take 400 to 800 micrograms (mcg) of folic acid every day.  If you want to prevent pregnancy, talk to your health care provider about birth control (contraception). OSTEOPOROSIS AND MENOPAUSE   Osteoporosis is a disease in which the bones lose minerals and strength with aging. This can result in serious bone fractures. Your risk for osteoporosis can be identified using a bone density scan.  If you are 49 years of age or older, or if you are at risk for osteoporosis and fractures, ask your health care provider if you should be screened.  Ask your health care provider whether you should take a calcium or vitamin D supplement to lower your risk for osteoporosis.  Menopause may have certain physical symptoms and risks.  Hormone replacement therapy may reduce some of these symptoms and risks. Talk to your health care provider about whether hormone replacement therapy is right for you.  HOME CARE INSTRUCTIONS   Schedule regular health, dental, and eye exams.  Stay current with your immunizations.   Do not use any tobacco products including cigarettes, chewing tobacco, or electronic cigarettes.  If you are pregnant, do not drink alcohol.  If you are breastfeeding, limit how much and how often you drink alcohol.  Limit alcohol intake to no more than 1 drink per day for nonpregnant women. One drink equals 12 ounces of beer, 5 ounces of wine, or 1 ounces of hard liquor.  Do not use street drugs.  Do not share needles.  Ask your health care provider for help if you need support or information about quitting drugs.  Tell your health care provider if you often feel depressed.  Tell your health  care provider if you have ever been abused or do not feel safe at home. Document Released: 12/01/2010 Document Revised: 10/02/2013 Document Reviewed: 04/19/2013 [facility] Patient Information 2015 [place], [place]. This information is not intended to replace advice given to you by your health care provider. Make sure you discuss any questions you have with your health care provider.

## 2014-07-09 NOTE — Progress Notes (Signed)
Patient ID: Lisa Green, female   DOB: Jun 14, 1950, 64 y.o.   MRN: [id number]  Subjective:     Lisa Green is a 64 y.o. female and is here for a comprehensive physical exam. The patient reports no problems.   She has had a 10 lb wt loss since her last visit, intentional,  Through diet and exercise. She has no complaints today.  She is sleeping well.   ..  Wt Readings from Last 3 Encounters:  07/09/14 198 lb 8 oz (90.039 kg)  06/19/13 208 lb 8 oz (94.575 kg)  03/16/12 200 lb (90.719 kg)    History   Social History  . Marital Status: Married    Spouse Name: N/A    Number of Children: N/A  . Years of Education: N/A   Occupational History  . Production assistant, radio     OT at [place] Topics  . Smoking status: Never Smoker   . Smokeless tobacco: Never Used  . Alcohol Use: Yes     Comment: 2 oz red wine daily  . Drug Use: No  . Sexual Activity: Not on file   Other Topics Concern  . Not on file   Social History Narrative   Health Maintenance  Topic Date Due  . PAP SMEAR  11/14/2013  . INFLUENZA VACCINE  12/31/2014  . MAMMOGRAM  06/27/2016  . TETANUS/TDAP  06/20/2019  . COLONOSCOPY  06/16/2021  . ZOSTAVAX  Completed    The following portions of the patient's history were reviewed and updated as appropriate: allergies, current medications, past family history, past medical history, past social history, past surgical history and problem list.  Review of Systems    Patient denies headache, fevers, malaise, unintentional weight loss, skin rash, eye pain, sinus congestion and sinus pain, sore throat, dysphagia,  hemoptysis , cough, dyspnea, wheezing, chest pain, palpitations, orthopnea, edema, abdominal pain, nausea, melena, diarrhea, constipation, flank pain, dysuria, hematuria, urinary  Frequency, nocturia, numbness, tingling, seizures,  Focal weakness, Loss of consciousness,  Tremor, insomnia, depression, anxiety, and suicidal ideation.     Objective:    BP 128/82 mmHg  Pulse 71  Temp(Src) 97 F (36.1 C) (Oral)  Resp 16  Ht 5\' 5"  (1.651 m)  Wt 198 lb 8 oz (90.039 kg)  BMI 33.03 kg/m2  SpO2 97%  General appearance: alert, cooperative and appears stated age Head: Normocephalic, without obvious abnormality, atraumatic Eyes: conjunctivae/corneas clear. PERRL, EOM's intact. Fundi benign. Ears: normal TM's and external ear canals both ears Nose: Nares normal. Septum midline. Mucosa normal. No drainage or sinus tenderness. Throat: lips, mucosa, and tongue normal; teeth and gums normal Neck: no adenopathy, no carotid bruit, no JVD, supple, symmetrical, trachea midline and thyroid not enlarged, symmetric, no tenderness/mass/nodules Lungs: clear to auscultation bilaterally Breasts: normal appearance, no masses or tenderness Heart: regular rate and rhythm, S1, S2 normal, no murmur, click, rub or gallop Abdomen: soft, non-tender; bowel sounds normal; no masses,  no organomegaly Extremities: extremities normal, atraumatic, no cyanosis or edema Pulses: 2+ and symmetric Skin: Skin color, texture, turgor normal. No rashes or lesions Neurologic: Alert and oriented X 3, normal strength and tone. Normal symmetric reflexes. Normal coordination and gait.    Assessment and Plan:   Problem List Items Addressed This Visit    Visit for preventive health examination - Primary    Annual wellness  exam was done as well as a comprehensive physical exam and management of acute and chronic conditions .  During the course of the visit the patient was educated and counseled about appropriate screening and preventive services including :  diabetes screening, lipid analysis with projected  10 year  risk for CAD , nutrition counseling, colorectal cancer screening, and recommended immunizations.  Printed recommendations for health maintenance screenings was given.       Obesity (BMI 30.0-34.9)    I have congratulated her in reduction of   BMI and encouraged   Continued weight loss with goal of 10% of body weigh over the next 6 months using a low glycemic index diet and regular exercise a minimum of 5 days per week.

## 2014-07-10 ENCOUNTER — Encounter: Payer: Self-pay | Admitting: Internal Medicine

## 2014-07-10 NOTE — Assessment & Plan Note (Signed)
I have congratulated her in reduction of   BMI and encouraged  Continued weight loss with goal of 10% of body weigh over the next 6 months using a low glycemic index diet and regular exercise a minimum of 5 days per week.    

## 2014-07-10 NOTE — Assessment & Plan Note (Signed)

## 2015-06-06 LAB — HEPATIC FUNCTION PANEL
ALT: 76 U/L — AB (ref 7–35)
AST: 25 U/L (ref 13–35)
Alkaline Phosphatase: 83 U/L (ref 25–125)
Bilirubin, Total: 0.3 mg/dL

## 2015-06-06 LAB — BASIC METABOLIC PANEL
BUN: 12 mg/dL (ref 4–21)
Creatinine: 0.8 mg/dL (ref 0.5–1.1)
Glucose: 98 mg/dL
Potassium: 4.9 mmol/L (ref 3.4–5.3)
Sodium: 141 mmol/L (ref 137–147)

## 2015-06-06 LAB — CBC AND DIFFERENTIAL
HEMOGLOBIN: 13 g/dL (ref 12.0–16.0)
PLATELETS: 242 10*3/uL (ref 150–399)
WBC: 4.5 10*3/mL

## 2015-06-06 LAB — LIPID PANEL
Cholesterol: 169 mg/dL (ref 0–200)
HDL: 55 mg/dL (ref 35–70)
LDL CALC: 96 mg/dL
LDl/HDL Ratio: 4.5
TRIGLYCERIDES: 90 mg/dL (ref 40–160)

## 2015-06-06 LAB — HEMOGLOBIN A1C: HEMOGLOBIN A1C: 5.8

## 2015-06-06 LAB — TSH: TSH: 1.6 u[IU]/mL (ref 0.41–5.90)

## 2015-06-14 ENCOUNTER — Encounter: Payer: Self-pay | Admitting: Internal Medicine

## 2015-06-14 ENCOUNTER — Ambulatory Visit (INDEPENDENT_AMBULATORY_CARE_PROVIDER_SITE_OTHER): Payer: 59 | Admitting: Internal Medicine

## 2015-06-14 VITALS — BP 138/90 | HR 79 | Temp 98.1°F | Resp 12 | Ht 65.0 in | Wt 202.0 lb

## 2015-06-14 DIAGNOSIS — E66811 Obesity, class 1: Secondary | ICD-10-CM

## 2015-06-14 DIAGNOSIS — Z0000 Encounter for general adult medical examination without abnormal findings: Secondary | ICD-10-CM | POA: Diagnosis not present

## 2015-06-14 DIAGNOSIS — K118 Other diseases of salivary glands: Secondary | ICD-10-CM

## 2015-06-14 DIAGNOSIS — Z90710 Acquired absence of both cervix and uterus: Secondary | ICD-10-CM | POA: Insufficient documentation

## 2015-06-14 DIAGNOSIS — E669 Obesity, unspecified: Secondary | ICD-10-CM

## 2015-06-14 DIAGNOSIS — Z1239 Encounter for other screening for malignant neoplasm of breast: Secondary | ICD-10-CM

## 2015-06-14 DIAGNOSIS — Z9079 Acquired absence of other genital organ(s): Secondary | ICD-10-CM | POA: Insufficient documentation

## 2015-06-14 DIAGNOSIS — E785 Hyperlipidemia, unspecified: Secondary | ICD-10-CM

## 2015-06-14 DIAGNOSIS — Z90721 Acquired absence of ovaries, unilateral: Secondary | ICD-10-CM

## 2015-06-14 NOTE — Progress Notes (Signed)
Patient ID: Lisa Green, female    DOB: 10-03-1950  Age: 65 y.o. MRN: [id number]  The patient is here for annual  wellness examination and management of other chronic and acute problems.  Last PAP smear 2012 no cervix  Colonoscopy 2013 normal  S/p TAH  Sees dermatology,  [facility]   Fasting labs reviewed.   The risk factors are reflected in the social history and were reviewed today .  The roster of all physicians providing medical care to patient - is listed in the Snapshot section of the chart.  Activities of daily living:  The patient is 100% independent in all ADLs: dressing, toileting, feeding as well as independent mobility  Home safety : The patient has smoke detectors in the home. They wear seatbelts.  There are no firearms at home. There is no violence in the home.   There is no risks for hepatitis, STDs or HIV. There is no   history of blood transfusion. They have no travel history to infectious disease endemic areas of the world.  The patient has seen their dentist in the last six month. They have seen their eye doctor in the last year. They admit to slight hearing difficulty with regard to whispered voices and some television programs.  They have deferred audiologic testing in the last year.  They do not  have excessive sun exposure. Discussed the need for sun protection: hats, long sleeves and use of sunscreen if there is significant sun exposure.   Diet: the importance of a healthy diet is discussed. They do have a healthy diet.  The benefits of regular aerobic exercise were discussed. She walks 4 times per week ,  20 minutes.   Depression screen: there are no signs or vegative symptoms of depression- irritability, change in appetite, anhedonia, sadness/tearfullness.  Cognitive assessment: the patient manages all their financial and personal affairs and is actively engaged. They could relate day,date,year and events; recalled 2/3 objects at 3 minutes; performed  clock-face test normally.  The following portions of the patient's history were reviewed and updated as appropriate: allergies, current medications, past family history, past medical history,  past surgical history, past social history  and problem list.  Visual acuity was not assessed per patient preference since she has regular follow up with her ophthalmologist. Hearing and body mass index were assessed and reviewed.   During the course of the visit the patient was educated and counseled about appropriate screening and preventive services including : fall prevention , diabetes screening, nutrition counseling, colorectal cancer screening, and recommended immunizations.    CC: The primary encounter diagnosis was Parotid mass. Diagnoses of S/P abdominal hysterectomy and right salpingo-oophorectomy, Breast cancer screening, Visit for preventive health examination, and Hyperlipidemia with target LDL less than 100 were also pertinent to this visit.  She has discovered a right sided hard mobile mass on the right side of her jaw that has been enlarging for the past 5 months,  Not painful. No history of smoking. Discussed referral  to ENT     History Aidy has a past medical history of Hyperlipidemia; Diverticulosis (2013); and Obesity (BMI 30.0-34.9).   She has past surgical history that includes Cesarean section (1981); Cesarean section (1983); Abdominal hysterectomy; Cosmetic surgery; and Endovenous ablation saphenous vein w/ laser (2008).   Her family history includes Cancer (age of onset: 53) in her sister; Coronary artery disease in her paternal grandfather, paternal grandmother, and paternal uncle; Coronary artery disease (age of onset: 18) in her  father; Heart disease in her brother, mother, paternal grandfather, paternal grandmother, and paternal uncle; Stroke in her father.She reports that she has never smoked. She has never used smokeless tobacco. She reports that she drinks alcohol. She  reports that she does not use illicit drugs.  Outpatient Prescriptions Prior to Visit  Medication Sig Dispense Refill  . rosuvastatin (CRESTOR) 5 MG tablet Take 1 tablet (5 mg total) by mouth at bedtime. 90 tablet 3   No facility-administered medications prior to visit.    Review of Systems   Patient denies headache, fevers, malaise, unintentional weight loss, skin rash, eye pain, sinus congestion and sinus pain, sore throat, dysphagia,  hemoptysis , cough, dyspnea, wheezing, chest pain, palpitations, orthopnea, edema, abdominal pain, nausea, melena, diarrhea, constipation, flank pain, dysuria, hematuria, urinary  Frequency, nocturia, numbness, tingling, seizures,  Focal weakness, Loss of consciousness,  Tremor, insomnia, depression, anxiety, and suicidal ideation.      Objective:  BP 138/90 mmHg  Pulse 79  Temp(Src) 98.1 F (36.7 C) (Oral)  Resp 12  Ht 5\' 5"  (1.651 m)  Wt 202 lb (91.627 kg)  BMI 33.61 kg/m2  SpO2 98%  Physical Exam  General appearance: alert, cooperative and appears stated age Ears: normal TM's and external ear canals both ears Throat: lips, mucosa, and tongue normal; teeth and gums normal Neck: no adenopathy, no carotid bruit, supple, symmetrical, trachea midline and thyroid not enlarged, symmetric, no tenderness/mass/nodules Face: right sided nodular non compressible bb sized mobile mass , right lower jaw.  Back: symmetric, no curvature. ROM normal. No CVA tenderness. Lungs: clear to auscultation bilaterally Heart: regular rate and rhythm, S1, S2 normal, no murmur, click, rub or gallop Abdomen: soft, non-tender; bowel sounds normal; no masses,  no organomegaly Pulses: 2+ and symmetric Skin: Skin color, texture, turgor normal. No rashes or lesions Lymph nodes: Cervical, supraclavicular, and axillary nodes normal.    Assessment & Plan:   Problem List Items Addressed This Visit    Hyperlipidemia with target LDL less than 100    Managed with [facility],    Lab Results  Component Value Date   CHOL 178 07/03/2014   HDL 46 07/03/2014   LDLCALC 113 07/03/2014   TRIG 93 07/03/2014   Lab Results  Component Value Date   ALT 100* 07/03/2014   AST 54* 07/03/2014   ALKPHOS 119 07/03/2014         Visit for preventive health examination    Annual comprehensive preventive exam was done as well as an evaluation and management of acute and chronic conditions .  During the course of the visit the patient was educated and counseled about appropriate screening and preventive services including :  diabetes screening, lipid analysis with projected  10 year  risk for CAD , nutrition counseling, colorectal cancer screening, and recommended immunizations.  Printed recommendations for health maintenance screenings was given.       Parotid mass - Primary    Nontender, history does not suggest stone.  Nonsmoker.  Referral to ENT for further evaluation .       S/P abdominal hysterectomy and right salpingo-oophorectomy    Other Visit Diagnoses    Breast cancer screening        Relevant Orders    MM DIGITAL SCREENING BILATERAL       I am having Ms. Delapuente maintain her rosuvastatin and montelukast.  Meds ordered this encounter  Medications  . montelukast (SINGULAIR) 10 MG tablet    Sig: Reported on 06/14/2015  Refill:  5    There are no discontinued medications.  Follow-up: No Follow-up on file.   Crecencio Mc, MD

## 2015-06-14 NOTE — Patient Instructions (Addendum)
I have ordered your mammogram and recommend continued annual screening despite the change in guidelines  I am referring you to Dr  Margaretha Sheffield to evaluate the "mass" on your left jaw line.  The office will call you with the appointment   Menopause is a normal process in which your reproductive ability comes to an end. This process happens gradually over a span of months to years, usually between the ages of 62 and 18. Menopause is complete when you have missed 12 consecutive menstrual periods. It is important to talk with your health care provider about some of the most common conditions that affect postmenopausal women, such as heart disease, cancer, and bone loss (osteoporosis). Adopting a healthy lifestyle and getting preventive care can help to promote your health and wellness. Those actions can also lower your chances of developing some of these common conditions. WHAT SHOULD I KNOW ABOUT MENOPAUSE? During menopause, you may experience a number of symptoms, such as:  Moderate-to-severe hot flashes.  Night sweats.  Decrease in sex drive.  Mood swings.  Headaches.  Tiredness.  Irritability.  Memory problems.  Insomnia. Choosing to treat or not to treat menopausal changes is an individual decision that you make with your health care provider. WHAT SHOULD I KNOW ABOUT HORMONE REPLACEMENT THERAPY AND SUPPLEMENTS? Hormone therapy products are effective for treating symptoms that are associated with menopause, such as hot flashes and night sweats. Hormone replacement carries certain risks, especially as you become older. If you are thinking about using estrogen or estrogen with progestin treatments, discuss the benefits and risks with your health care provider. WHAT SHOULD I KNOW ABOUT HEART DISEASE AND STROKE? Heart disease, heart attack, and stroke become more likely as you age. This may be due, in part, to the hormonal changes that your body experiences during menopause. These can  affect how your body processes dietary fats, triglycerides, and cholesterol. Heart attack and stroke are both medical emergencies. There are many things that you can do to help prevent heart disease and stroke:  Have your blood pressure checked at least every 1-2 years. High blood pressure causes heart disease and increases the risk of stroke.  If you are 44-67 years old, ask your health care provider if you should take aspirin to prevent a heart attack or a stroke.  Do not use any tobacco products, including cigarettes, chewing tobacco, or electronic cigarettes. If you need help quitting, ask your health care provider.  It is important to eat a healthy diet and maintain a healthy weight.  Be sure to include plenty of vegetables, fruits, low-fat dairy products, and lean protein.  Avoid eating foods that are high in solid fats, added sugars, or salt (sodium).  Get regular exercise. This is one of the most important things that you can do for your health.  Try to exercise for at least 150 minutes each week. The type of exercise that you do should increase your heart rate and make you sweat. This is known as moderate-intensity exercise.  Try to do strengthening exercises at least twice each week. Do these in addition to the moderate-intensity exercise.  Know your numbers.Ask your health care provider to check your cholesterol and your blood glucose. Continue to have your blood tested as directed by your health care provider. WHAT SHOULD I KNOW ABOUT CANCER SCREENING? There are several types of cancer. Take the following steps to reduce your risk and to catch any cancer development as early as possible. Breast Cancer  Practice breast  self-awareness.  This means understanding how your breasts normally appear and feel.  It also means doing regular breast self-exams. Let your health care provider know about any changes, no matter how small.  If you are 71 or older, have a clinician do a  breast exam (clinical breast exam or CBE) every year. Depending on your age, family history, and medical history, it may be recommended that you also have a yearly breast X-ray (mammogram).  If you have a family history of breast cancer, talk with your health care provider about genetic screening.  If you are at high risk for breast cancer, talk with your health care provider about having an MRI and a mammogram every year.  Breast cancer (BRCA) gene test is recommended for women who have family members with BRCA-related cancers. Results of the assessment will determine the need for genetic counseling and BRCA1 and for BRCA2 testing. BRCA-related cancers include these types:  Breast. This occurs in males or females.  Ovarian.  Tubal. This may also be called fallopian tube cancer.  Cancer of the abdominal or pelvic lining (peritoneal cancer).  Prostate.  Pancreatic. Cervical, Uterine, and Ovarian Cancer Your health care provider may recommend that you be screened regularly for cancer of the pelvic organs. These include your ovaries, uterus, and vagina. This screening involves a pelvic exam, which includes checking for microscopic changes to the surface of your cervix (Pap test).  For women ages 21-65, health care providers may recommend a pelvic exam and a Pap test every three years. For women ages 106-65, they may recommend the Pap test and pelvic exam, combined with testing for human papilloma virus (HPV), every five years. Some types of HPV increase your risk of cervical cancer. Testing for HPV may also be done on women of any age who have unclear Pap test results.  Other health care providers may not recommend any screening for nonpregnant women who are considered low risk for pelvic cancer and have no symptoms. Ask your health care provider if a screening pelvic exam is right for you.  If you have had past treatment for cervical cancer or a condition that could lead to cancer, you need  Pap tests and screening for cancer for at least 20 years after your treatment. If Pap tests have been discontinued for you, your risk factors (such as having a new sexual partner) need to be reassessed to determine if you should start having screenings again. Some women have medical problems that increase the chance of getting cervical cancer. In these cases, your health care provider may recommend that you have screening and Pap tests more often.  If you have a family history of uterine cancer or ovarian cancer, talk with your health care provider about genetic screening.  If you have vaginal bleeding after reaching menopause, tell your health care provider.  There are currently no reliable tests available to screen for ovarian cancer. Lung Cancer Lung cancer screening is recommended for adults 64-63 years old who are at high risk for lung cancer because of a history of smoking. A yearly low-dose CT scan of the lungs is recommended if you:  Currently smoke.  Have a history of at least 30 pack-years of smoking and you currently smoke or have quit within the past 15 years. A pack-year is smoking an average of one pack of cigarettes per day for one year. Yearly screening should:  Continue until it has been 15 years since you quit.  Stop if you develop a  health problem that would prevent you from having lung cancer treatment. Colorectal Cancer  This type of cancer can be detected and can often be prevented.  Routine colorectal cancer screening usually begins at age 94 and continues through age 38.  If you have risk factors for colon cancer, your health care provider may recommend that you be screened at an earlier age.  If you have a family history of colorectal cancer, talk with your health care provider about genetic screening.  Your health care provider may also recommend using home test kits to check for hidden blood in your stool.  A small camera at the end of a tube can be used to  examine your colon directly (sigmoidoscopy or colonoscopy). This is done to check for the earliest forms of colorectal cancer.  Direct examination of the colon should be repeated every 5-10 years until age 54. However, if early forms of precancerous polyps or small growths are found or if you have a family history or genetic risk for colorectal cancer, you may need to be screened more often. Skin Cancer  Check your skin from head to toe regularly.  Monitor any moles. Be sure to tell your health care provider:  About any new moles or changes in moles, especially if there is a change in a mole's shape or color.  If you have a mole that is larger than the size of a pencil eraser.  If any of your family members has a history of skin cancer, especially at a young age, talk with your health care provider about genetic screening.  Always use sunscreen. Apply sunscreen liberally and repeatedly throughout the day.  Whenever you are outside, protect yourself by wearing long sleeves, pants, a wide-brimmed hat, and sunglasses. WHAT SHOULD I KNOW ABOUT OSTEOPOROSIS? Osteoporosis is a condition in which bone destruction happens more quickly than new bone creation. After menopause, you may be at an increased risk for osteoporosis. To help prevent osteoporosis or the bone fractures that can happen because of osteoporosis, the following is recommended:  If you are 41-81 years old, get at least 1,000 mg of calcium and at least 600 mg of vitamin D per day.  If you are older than age 74 but younger than age 19, get at least 1,200 mg of calcium and at least 600 mg of vitamin D per day.  If you are older than age 52, get at least 1,200 mg of calcium and at least 800 mg of vitamin D per day. Smoking and excessive alcohol intake increase the risk of osteoporosis. Eat foods that are rich in calcium and vitamin D, and do weight-bearing exercises several times each week as directed by your health care provider. WHAT  SHOULD I KNOW ABOUT HOW MENOPAUSE AFFECTS [place]? Depression may occur at any age, but it is more common as you become older. Common symptoms of depression include:  Low or sad mood.  Changes in sleep patterns.  Changes in appetite or eating patterns.  Feeling an overall lack of motivation or enjoyment of activities that you previously enjoyed.  Frequent crying spells. Talk with your health care provider if you think that you are experiencing depression. WHAT SHOULD I KNOW ABOUT IMMUNIZATIONS? It is important that you get and maintain your immunizations. These include:  Tetanus, diphtheria, and pertussis (Tdap) booster vaccine.  Influenza every year before the flu season begins.  Pneumonia vaccine.  Shingles vaccine. Your health care provider may also recommend other immunizations.   This  information is not intended to replace advice given to you by your health care provider. Make sure you discuss any questions you have with your health care provider.   Document Released: 07/10/2005 Document Revised: 06/08/2014 Document Reviewed: 01/18/2014 Elsevier Interactive Patient Education [employer].

## 2015-06-14 NOTE — Progress Notes (Signed)
Pre-visit discussion using our clinic review tool. No additional management support is needed unless otherwise documented below in the visit note.  

## 2015-06-16 DIAGNOSIS — K118 Other diseases of salivary glands: Secondary | ICD-10-CM | POA: Insufficient documentation

## 2015-06-16 NOTE — Assessment & Plan Note (Addendum)
Managed with Crestor.  LFTS are stable    Lab Results  Component Value Date   CHOL 178 07/03/2014   HDL 46 07/03/2014   LDLCALC 113 07/03/2014   TRIG 93 07/03/2014   Lab Results  Component Value Date   ALT 100* 07/03/2014   AST 54* 07/03/2014   ALKPHOS 119 07/03/2014

## 2015-06-16 NOTE — Assessment & Plan Note (Signed)
Annual comprehensive preventive exam was done as well as an evaluation and management of acute and chronic conditions .  During the course of the visit the patient was educated and counseled about appropriate screening and preventive services including :  diabetes screening, lipid analysis with projected  10 year  risk for CAD , nutrition counseling,  colorectal cancer screening, and recommended immunizations.  Printed recommendations for health maintenance screenings was given.  

## 2015-06-16 NOTE — Assessment & Plan Note (Signed)
Nontender, history does not suggest stone.  Nonsmoker.  Referral to ENT for further evaluation .

## 2015-06-16 NOTE — Assessment & Plan Note (Signed)
I have addressed  BMI and recommended wt loss of 10% of body weight over the next 6 months using a low glycemic index diet and regular exercise a minimum of 5 days per week.   

## 2015-06-26 ENCOUNTER — Encounter: Payer: Self-pay | Admitting: Internal Medicine

## 2015-07-02 ENCOUNTER — Encounter: Payer: Self-pay | Admitting: Internal Medicine

## 2015-07-02 DIAGNOSIS — K118 Other diseases of salivary glands: Secondary | ICD-10-CM

## 2015-07-03 NOTE — Telephone Encounter (Signed)
Please advise referral to Dr.Jeungle I do not see in chart the Mammogram order I do see and have forwarded to referral.

## 2015-07-03 NOTE — Telephone Encounter (Signed)
THE ENT Referral did not get done and is in process now to Dr Kathyrn Sheriff.   The mammogram was ordered nearly a month ago.  Can you find out what happened?

## 2015-07-04 NOTE — Telephone Encounter (Signed)
Can you schedule this patient a 3- d mammogram/

## 2015-07-17 DIAGNOSIS — R220 Localized swelling, mass and lump, head: Secondary | ICD-10-CM | POA: Diagnosis not present

## 2015-07-18 ENCOUNTER — Ambulatory Visit
Admission: RE | Admit: 2015-07-18 | Discharge: 2015-07-18 | Disposition: A | Discharge status: Home / Self Care | Payer: 59 | Source: Ambulatory Visit | Attending: Internal Medicine | Admitting: Internal Medicine

## 2015-07-18 ENCOUNTER — Other Ambulatory Visit: Payer: Self-pay | Admitting: Internal Medicine

## 2015-07-18 DIAGNOSIS — Z1239 Encounter for other screening for malignant neoplasm of breast: Secondary | ICD-10-CM

## 2015-07-18 DIAGNOSIS — Z1231 Encounter for screening mammogram for malignant neoplasm of breast: Secondary | ICD-10-CM | POA: Insufficient documentation

## 2016-01-15 ENCOUNTER — Telehealth: Payer: Self-pay | Admitting: Physician Assistant

## 2016-01-15 MED ORDER — MONTELUKAST SODIUM 10 MG PO TABS: 10.0000 mg | ORAL_TABLET | Freq: Every day | ORAL | 12 refills | Status: DC

## 2016-01-15 NOTE — Telephone Encounter (Signed)
Med refill approved, pt has hx of asthma, worsening in the fall

## 2016-02-18 ENCOUNTER — Other Ambulatory Visit: Payer: Self-pay | Admitting: Internal Medicine

## 2016-02-20 ENCOUNTER — Other Ambulatory Visit: Payer: Self-pay | Admitting: Internal Medicine

## 2016-02-21 MED ORDER — ROSUVASTATIN CALCIUM 5 MG PO TABS: 5.0000 mg | ORAL_TABLET | Freq: Every day | ORAL | 1 refills | Status: DC

## 2016-06-16 ENCOUNTER — Ambulatory Visit: Payer: Self-pay | Admitting: Physician Assistant

## 2016-06-16 DIAGNOSIS — R319 Hematuria, unspecified: Principal | ICD-10-CM

## 2016-06-16 DIAGNOSIS — N390 Urinary tract infection, site not specified: Secondary | ICD-10-CM

## 2016-06-16 LAB — POCT URINALYSIS DIPSTICK
Bilirubin, UA: NEGATIVE
GLUCOSE UA: NEGATIVE
Ketones, UA: NEGATIVE
NITRITE UA: NEGATIVE
SPEC GRAV UA: 1.025
UROBILINOGEN UA: 0.2
pH, UA: 6

## 2016-06-16 MED ORDER — CIPROFLOXACIN HCL 250 MG PO TABS: 250.0000 mg | ORAL_TABLET | Freq: Two times a day (BID) | ORAL | 0 refills | Status: DC

## 2016-06-16 NOTE — Progress Notes (Signed)
Pt stopped by office and dropped off urine, c/o uti sx with burning urgency and frequency ua + leuks  rx for cipro sent to [facility]

## 2016-10-19 ENCOUNTER — Ambulatory Visit: Payer: Self-pay | Admitting: Physician Assistant

## 2016-10-19 DIAGNOSIS — L259 Unspecified contact dermatitis, unspecified cause: Secondary | ICD-10-CM

## 2016-10-19 MED ORDER — DEXAMETHASONE SODIUM PHOSPHATE 10 MG/ML IJ SOLN
10.0000 mg | Freq: Once | INTRAMUSCULAR | Status: AC
  Administered 2016-10-19: 10 mg via INTRAMUSCULAR

## 2016-10-19 NOTE — Progress Notes (Signed)
S: c/o itchy rash on chest and arms,  was outside mowing the yard and then broke out, sx for few days, tried multiple otc meds without relief, denies fever/chills  O: vitals wnl, nad,skin with small raised red areas some without streaks/blisters, no drainage, n/v intact  A: acute contact dermatitis  P: decadron 10 mg IM, will call in steroid if needed

## 2016-10-20 MED ORDER — PREDNISONE 10 MG (48) PO TBPK
ORAL_TABLET | ORAL | 0 refills | Status: DC
Start: 2016-10-20 — End: 2017-04-07

## 2016-10-20 NOTE — Progress Notes (Signed)
Decadron didn't really help with poison ivy, called in sterapred ds 10mg  12 d dose pack

## 2016-10-20 NOTE — Addendum Note (Signed)
Addended by: Versie Starks on: 10/20/2016 01:48 PM   Modules accepted: Orders

## 2016-11-19 DIAGNOSIS — L309 Dermatitis, unspecified: Secondary | ICD-10-CM | POA: Diagnosis not present

## 2016-11-19 DIAGNOSIS — L853 Xerosis cutis: Secondary | ICD-10-CM | POA: Diagnosis not present

## 2016-12-03 DIAGNOSIS — L280 Lichen simplex chronicus: Secondary | ICD-10-CM | POA: Diagnosis not present

## 2017-01-26 ENCOUNTER — Other Ambulatory Visit: Payer: Self-pay | Admitting: Internal Medicine

## 2017-01-26 DIAGNOSIS — Z1231 Encounter for screening mammogram for malignant neoplasm of breast: Secondary | ICD-10-CM

## 2017-03-24 ENCOUNTER — Ambulatory Visit
Admission: RE | Admit: 2017-03-24 | Discharge: 2017-03-24 | Disposition: A | Discharge status: Home / Self Care | Payer: 59 | Source: Ambulatory Visit | Attending: Internal Medicine | Admitting: Internal Medicine

## 2017-03-24 DIAGNOSIS — Z1231 Encounter for screening mammogram for malignant neoplasm of breast: Secondary | ICD-10-CM | POA: Diagnosis not present

## 2017-03-30 LAB — LIPID PANEL
Cholesterol: 213 — AB (ref 0–200)
HDL: 56 (ref 35–70)
LDL CALC: 122
Triglycerides: 176 — AB (ref 40–160)

## 2017-03-30 LAB — CBC AND DIFFERENTIAL
HCT: 42 (ref 36–46)
Hemoglobin: 13.3 (ref 12.0–16.0)
NEUTROS ABS: 2
Platelets: 269 (ref 150–399)
WBC: 4.4

## 2017-03-30 LAB — BASIC METABOLIC PANEL
BUN: 15 (ref 4–21)
GLUCOSE: 106
Potassium: 4.9 (ref 3.4–5.3)
Sodium: 142 (ref 137–147)

## 2017-03-30 LAB — HEPATIC FUNCTION PANEL
ALK PHOS: 82 (ref 25–125)
ALT: 32 (ref 7–35)
AST: 27 (ref 13–35)
BILIRUBIN, TOTAL: 0.3

## 2017-03-30 LAB — TSH: TSH: 2.88 (ref 0.41–5.90)

## 2017-04-07 ENCOUNTER — Ambulatory Visit (INDEPENDENT_AMBULATORY_CARE_PROVIDER_SITE_OTHER): Payer: 59 | Admitting: Internal Medicine

## 2017-04-07 ENCOUNTER — Encounter: Payer: Self-pay | Admitting: Internal Medicine

## 2017-04-07 ENCOUNTER — Other Ambulatory Visit: Payer: Self-pay

## 2017-04-07 VITALS — BP 120/84 | HR 72 | Temp 97.6°F | Resp 14 | Ht 65.0 in | Wt 213.2 lb

## 2017-04-07 DIAGNOSIS — R7989 Other specified abnormal findings of blood chemistry: Secondary | ICD-10-CM

## 2017-04-07 DIAGNOSIS — E669 Obesity, unspecified: Secondary | ICD-10-CM | POA: Diagnosis not present

## 2017-04-07 DIAGNOSIS — Z23 Encounter for immunization: Secondary | ICD-10-CM

## 2017-04-07 DIAGNOSIS — R945 Abnormal results of liver function studies: Secondary | ICD-10-CM | POA: Diagnosis not present

## 2017-04-07 DIAGNOSIS — E785 Hyperlipidemia, unspecified: Secondary | ICD-10-CM | POA: Diagnosis not present

## 2017-04-07 DIAGNOSIS — Z0000 Encounter for general adult medical examination without abnormal findings: Secondary | ICD-10-CM

## 2017-04-07 MED ORDER — ROSUVASTATIN CALCIUM 10 MG PO TABS: 10.0000 mg | ORAL_TABLET | Freq: Every day | ORAL | 2 refills | Status: DC

## 2017-04-07 NOTE — Progress Notes (Signed)
Patient ID: Lisa Green, female    DOB: 1950/06/12  Age: 66 y.o. MRN: [id number]  The patient is here for annual  Non gyn examination and management of other chronic and acute problems.  Up to date on mammogram colonoscopy, and labs done last week    The risk factors are reflected in the social history.  The roster of all physicians providing medical care to patient - is listed in the Snapshot section of the chart.  Activities of daily living:  The patient is 100% independent in all ADLs: dressing, toileting, feeding as well as independent mobility  Home safety : The patient has smoke detectors in the home. They wear seatbelts.  There are no firearms at home. There is no violence in the home.   There is no risks for hepatitis, STDs or HIV. There is no   history of blood transfusion. They have no travel history to infectious disease endemic areas of the world.  The patient has seen their dentist in the last six month. They have seen their eye doctor in the last year. They admit to slight hearing difficulty with regard to whispered voices and some television programs.  They have deferred audiologic testing in the last year.  They do not  have excessive sun exposure. Discussed the need for sun protection: hats, long sleeves and use of sunscreen if there is significant sun exposure.   Diet: the importance of a healthy diet is discussed. They do have a healthy diet.  The benefits of regular aerobic exercise were discussed. She walks 4 times per week ,  20 minutes.   Depression screen: there are no signs or vegative symptoms of depression- irritability, change in appetite, anhedonia, sadness/tearfullness.  Cognitive assessment: the patient manages all their financial and personal affairs and is actively engaged. They could relate day,date,year and events; recalled 2/3 objects at 3 minutes; performed clock-face test normally.  The following portions of the patient's history were reviewed and  updated as appropriate: allergies, current medications, past family history, past medical history,  past surgical history, past social history  and problem list.  Visual acuity was not assessed per patient preference since she has regular follow up with her ophthalmologist. Hearing and body mass index were assessed and reviewed.   During the course of the visit the patient was educated and counseled about appropriate screening and preventive services including : fall prevention , diabetes screening, nutrition counseling, colorectal cancer screening, and recommended immunizations.    CC: The primary encounter diagnosis was Need for vaccination with 13-polyvalent pneumococcal conjugate vaccine. Diagnoses of Elevated liver function tests, Hyperlipidemia with target LDL less than 100, Obesity (BMI 30.0-34.9), and Visit for preventive health examination were also pertinent to this visit.  Treated by [facility] for UTI once,    Had a pruritic red macular rash  In July on upper body after working in the sun in the yard. (arms chest and shoulders) was  treated with prednisone 2 week taper  and otc HC,  Persisted for 6 weeks,  Saw isenstein bc did not resolve  PA diagnosed cutaneous Lupus,  positive ANA,  Biopsy inconclusive but had uses steroid cream ,  Was prescribed an expensive cream for weeks   Lipid control:  Now on generic crestor .  Not eating a good diet bc a stressful   difficult year.  Husband has been in and out of hospital with 3 vessel CAD.   Stress eating   Walking 3 miles 4 days  per week .  STARTED BACK ON HERBAL LIFE 8 MONTHS AGO BUT SKIPPING LUNCH.    History Lisa Green has a past medical history of Diverticulosis (2013), Hyperlipidemia, and Obesity (BMI 30.0-34.9).   She has a past surgical history that includes Cesarean section (1981); Cesarean section (1983); Abdominal hysterectomy; Cosmetic surgery; and Endovenous ablation saphenous vein w/ laser (2008).   Her family history  includes Cancer (age of onset: 28) in her sister; Coronary artery disease in her paternal grandfather, paternal grandmother, and paternal uncle; Coronary artery disease (age of onset: 52) in her father; Heart disease in her brother, mother, paternal grandfather, paternal grandmother, and paternal uncle; Stroke in her father.She reports that  has never smoked. she has never used smokeless tobacco. She reports that she drinks alcohol. She reports that she does not use drugs.  Outpatient Medications Prior to Visit  Medication Sig Dispense Refill  . rosuvastatin (CRESTOR) 5 MG tablet Take 1 tablet (5 mg total) by mouth daily. 90 tablet 1  . ciprofloxacin (CIPRO) 250 MG tablet Take 1 tablet (250 mg total) by mouth 2 (two) times daily. (Patient not taking: Reported on 04/07/2017) 14 tablet 0  . CRESTOR 5 MG tablet TAKE 1 TABLET BY MOUTH AT BEDTIME. (Patient not taking: Reported on 04/07/2017) 90 tablet 1  . montelukast (SINGULAIR) 10 MG tablet Take 1 tablet (10 mg total) by mouth at bedtime. (Patient not taking: Reported on 04/07/2017) 30 tablet 12  . predniSONE (STERAPRED UNI-PAK 48 TAB) 10 MG (48) TBPK tablet Use as directed (Patient not taking: Reported on 04/07/2017) 48 tablet 0   No facility-administered medications prior to visit.     Review of Systems   Patient denies headache, fevers, malaise, unintentional weight loss, skin rash, eye pain, sinus congestion and sinus pain, sore throat, dysphagia,  hemoptysis , cough, dyspnea, wheezing, chest pain, palpitations, orthopnea, edema, abdominal pain, nausea, melena, diarrhea, constipation, flank pain, dysuria, hematuria, urinary  Frequency, nocturia, numbness, tingling, seizures,  Focal weakness, Loss of consciousness,  Tremor, insomnia, depression, anxiety, and suicidal ideation.      Objective:  BP 120/84 (BP Location: Left Arm, Patient Position: Sitting, Cuff Size: Large)   Pulse 72   Temp 97.6 F (36.4 C) (Oral)   Resp 14   Ht 5\' 5"  (1.651 m)    Wt 213 lb 3.2 oz (96.7 kg)   SpO2 98%   BMI 35.48 kg/m   Physical Exam   General appearance: alert, cooperative and appears stated age Head: Normocephalic, without obvious abnormality, atraumatic Eyes: conjunctivae/corneas clear. PERRL, EOM's intact. Fundi benign. Ears: normal TM's and external ear canals both ears Nose: Nares normal. Septum midline. Mucosa normal. No drainage or sinus tenderness. Throat: lips, mucosa, and tongue normal; teeth and gums normal Neck: no adenopathy, no carotid bruit, no JVD, supple, symmetrical, trachea midline and thyroid not enlarged, symmetric, no tenderness/mass/nodules Lungs: clear to auscultation bilaterally Breasts: normal appearance, no masses or tenderness Heart: regular rate and rhythm, S1, S2 normal, no murmur, click, rub or gallop Abdomen: soft, non-tender; bowel sounds normal; no masses,  no organomegaly Extremities: extremities normal, atraumatic, no cyanosis or edema Pulses: 2+ and symmetric Skin: Skin color, texture, turgor normal. No rashes or lesions Neurologic: Alert and oriented X 3, normal strength and tone. Normal symmetric reflexes. Normal coordination and gait.     Assessment & Plan:   Problem List Items Addressed This Visit    Elevated liver function tests    Attributed to fatty liver by recent serologic evaluation and ultrasound.  I've encouraged her to address her weight with a low glycemic index diet and regular exercise.      Hyperlipidemia with target LDL less than 100    Not at goal on current crestor dose. Increasing dose to 10 mg daily  Lab Results  Component Value Date   CHOL 213 (A) 03/30/2017   HDL 56 03/30/2017   LDLCALC 122 03/30/2017   TRIG 176 (A) 03/30/2017         Relevant Medications   rosuvastatin (CRESTOR) 10 MG tablet   Obesity (BMI 30.0-34.9)    I have addressed  BMI and recommended wt loss of 10% of body weight over the next 6 months using a low fat, low starch, high protein  fruit/vegetable  based Mediterranean diet and 30 minutes of aerobic exercise a minimum of 5 days per week.        Visit for preventive health examination    Annual comprehensive preventive exam was done as well as an evaluation and management of chronic conditions .  During the course of the visit the patient was educated and counseled about appropriate screening and preventive services including :  diabetes screening, lipid analysis with projected  10 year  risk for CAD , nutrition counseling, breast, cervical and colorectal cancer screening, and recommended immunizations.  Printed recommendations for health maintenance screenings was given       Other Visit Diagnoses    Need for vaccination with 13-polyvalent pneumococcal conjugate vaccine    -  Primary   Relevant Orders   Pneumococcal conjugate vaccine 13-valent IM (Completed)     .  I have discontinued Pansy A. Karbowski's montelukast, CRESTOR, ciprofloxacin, and predniSONE. I have also changed her rosuvastatin.  Meds ordered this encounter  Medications  . rosuvastatin (CRESTOR) 10 MG tablet    Sig: Take 1 tablet (10 mg total) daily by mouth.    Dispense:  30 tablet    Refill:  2    Medications Discontinued During This Encounter  Medication Reason  . ciprofloxacin (CIPRO) 250 MG tablet Completed Course  . CRESTOR 5 MG tablet Change in therapy  . montelukast (SINGULAIR) 10 MG tablet Patient has not taken in last 30 days  . predniSONE (STERAPRED UNI-PAK 48 TAB) 10 MG (48) TBPK tablet Completed Course  . rosuvastatin (CRESTOR) 5 MG tablet     Follow-up: No Follow-up on file.   Crecencio Mc, MD

## 2017-04-07 NOTE — Patient Instructions (Addendum)
I am increasing your Crestor dose to 10 mg daily.   We will repeat fasting labs in 3 to 6 months    Low carb ice cream  Has become more popular and there are many more choices :  E  enlightenment (really good )   [employer] Carb Smart   Skinny Cow  Weight watchers   Don't  skip meals,  Eat something  every 3 hours,  (100 cal)  To avoid slowing down your metabolism  Bring the 2 lb weights with you on your walk and exercise upper body to increase metabolism

## 2017-04-08 NOTE — Assessment & Plan Note (Signed)
I have addressed  BMI and recommended wt loss of 10% of body weight over the next 6 months using a low fat, low starch, high protein  fruit/vegetable based Mediterranean diet and 30 minutes of aerobic exercise a minimum of 5 days per week.   

## 2017-04-08 NOTE — Assessment & Plan Note (Signed)
Annual comprehensive preventive exam was done as well as an evaluation and management of chronic conditions .  During the course of the visit the patient was educated and counseled about appropriate screening and preventive services including :  diabetes screening, lipid analysis with projected  10 year  risk for CAD , nutrition counseling, breast, cervical and colorectal cancer screening, and recommended immunizations.  Printed recommendations for health maintenance screenings was given 

## 2017-04-08 NOTE — Assessment & Plan Note (Signed)
Attributed to fatty liver by recent serologic evaluation and ultrasound. I've encouraged her to address her weight with a low glycemic index diet and regular exercise.

## 2017-04-08 NOTE — Assessment & Plan Note (Signed)
Not at goal on current crestor dose. Increasing dose to 10 mg daily  Lab Results  Component Value Date   CHOL 213 (A) 03/30/2017   HDL 56 03/30/2017   LDLCALC 122 03/30/2017   TRIG 176 (A) 03/30/2017

## 2017-05-05 ENCOUNTER — Ambulatory Visit: Payer: Self-pay | Admitting: Physician Assistant

## 2017-05-05 ENCOUNTER — Encounter: Payer: Self-pay | Admitting: Physician Assistant

## 2017-05-05 VITALS — BP 140/90 | HR 87 | Temp 98.3°F

## 2017-05-05 DIAGNOSIS — J0120 Acute ethmoidal sinusitis, unspecified: Secondary | ICD-10-CM

## 2017-05-05 MED ORDER — BENZONATATE 100 MG PO CAPS: 200.0000 mg | ORAL_CAPSULE | Freq: Three times a day (TID) | ORAL | 0 refills | Status: DC | PRN

## 2017-05-05 MED ORDER — AMOXICILLIN 875 MG PO TABS: 875.0000 mg | ORAL_TABLET | Freq: Two times a day (BID) | ORAL | 0 refills | Status: DC

## 2017-05-05 MED ORDER — FEXOFENADINE-PSEUDOEPHED ER 60-120 MG PO TB12: 1.0000 | ORAL_TABLET | Freq: Two times a day (BID) | ORAL | 0 refills | Status: DC

## 2017-05-05 NOTE — Progress Notes (Signed)
Augmentin

## 2017-05-05 NOTE — Progress Notes (Signed)
   Subjective: Sinus congestion     Patient ID: Lisa Green, female    DOB: [birth year], 66 y.o.   MRN: [id number]  HPI Patient complaining of 1 week of l nasal congestion, bilateral ear pressure, intermittent rhinorrhea, and sore throat secondary to postnasal drainage. Patient also complaining of nonproductive cough. Patient stated no relief over-the-counter cough preparations. Patient denies fever/chills, nausea, vomiting, or diarrhea.   Review of Systems    negative except for complaint Objective:   Physical Exam Each ENT remarkable for bilateral edematous nasal turbinates bilateral, maxillary guarding, with thick rhinorrhea, and edematous TMs. Erythematous pharynx with postnasal drainage. Neck is supple without adenopathy. Lungs clear to auscultation heart is regular rate and rhythm.       Assessment & Plan: Sinusitis   Patient given prescription for amoxicillin, Allegra-D, and Tessalon Perles. Patient advised follow PCP if no improvement in 5 days.

## 2017-07-05 DIAGNOSIS — M65839 Other synovitis and tenosynovitis, unspecified forearm: Secondary | ICD-10-CM | POA: Diagnosis not present

## 2017-07-05 DIAGNOSIS — M79644 Pain in right finger(s): Secondary | ICD-10-CM | POA: Diagnosis not present

## 2017-07-05 DIAGNOSIS — G5601 Carpal tunnel syndrome, right upper limb: Secondary | ICD-10-CM | POA: Diagnosis not present

## 2017-08-16 DIAGNOSIS — G5601 Carpal tunnel syndrome, right upper limb: Secondary | ICD-10-CM | POA: Diagnosis not present

## 2017-09-20 DIAGNOSIS — G5601 Carpal tunnel syndrome, right upper limb: Secondary | ICD-10-CM | POA: Diagnosis not present

## 2017-09-28 DIAGNOSIS — H5203 Hypermetropia, bilateral: Secondary | ICD-10-CM | POA: Diagnosis not present

## 2017-10-08 DIAGNOSIS — G5601 Carpal tunnel syndrome, right upper limb: Secondary | ICD-10-CM | POA: Diagnosis not present

## 2017-10-14 DIAGNOSIS — M79641 Pain in right hand: Secondary | ICD-10-CM | POA: Insufficient documentation

## 2018-03-31 ENCOUNTER — Other Ambulatory Visit: Payer: Self-pay | Admitting: Internal Medicine

## 2018-05-18 ENCOUNTER — Other Ambulatory Visit: Payer: Self-pay | Admitting: Internal Medicine

## 2018-05-18 DIAGNOSIS — Z1231 Encounter for screening mammogram for malignant neoplasm of breast: Secondary | ICD-10-CM

## 2018-05-30 ENCOUNTER — Encounter: Payer: Self-pay | Admitting: Internal Medicine

## 2018-05-30 ENCOUNTER — Ambulatory Visit (INDEPENDENT_AMBULATORY_CARE_PROVIDER_SITE_OTHER): Payer: 59

## 2018-05-30 ENCOUNTER — Ambulatory Visit (INDEPENDENT_AMBULATORY_CARE_PROVIDER_SITE_OTHER): Payer: 59 | Admitting: Internal Medicine

## 2018-05-30 VITALS — BP 136/88 | HR 73 | Temp 98.3°F | Resp 14 | Ht 65.0 in | Wt 219.2 lb

## 2018-05-30 DIAGNOSIS — Z23 Encounter for immunization: Secondary | ICD-10-CM

## 2018-05-30 DIAGNOSIS — R030 Elevated blood-pressure reading, without diagnosis of hypertension: Secondary | ICD-10-CM

## 2018-05-30 DIAGNOSIS — E669 Obesity, unspecified: Secondary | ICD-10-CM | POA: Diagnosis not present

## 2018-05-30 DIAGNOSIS — E785 Hyperlipidemia, unspecified: Secondary | ICD-10-CM

## 2018-05-30 DIAGNOSIS — Z0000 Encounter for general adult medical examination without abnormal findings: Secondary | ICD-10-CM

## 2018-05-30 DIAGNOSIS — M7989 Other specified soft tissue disorders: Secondary | ICD-10-CM | POA: Diagnosis not present

## 2018-05-30 DIAGNOSIS — Z13820 Encounter for screening for osteoporosis: Secondary | ICD-10-CM | POA: Diagnosis not present

## 2018-05-30 DIAGNOSIS — M79644 Pain in right finger(s): Secondary | ICD-10-CM

## 2018-05-30 DIAGNOSIS — K760 Fatty (change of) liver, not elsewhere classified: Secondary | ICD-10-CM

## 2018-05-30 DIAGNOSIS — I10 Essential (primary) hypertension: Secondary | ICD-10-CM | POA: Diagnosis not present

## 2018-05-30 LAB — COMPREHENSIVE METABOLIC PANEL
ALK PHOS: 77 U/L (ref 39–117)
ALT: 26 U/L (ref 0–35)
AST: 23 U/L (ref 0–37)
Albumin: 4.8 g/dL (ref 3.5–5.2)
BUN: 13 mg/dL (ref 6–23)
CALCIUM: 10 mg/dL (ref 8.4–10.5)
CO2: 29 mEq/L (ref 19–32)
Chloride: 102 mEq/L (ref 96–112)
Creatinine, Ser: 0.69 mg/dL (ref 0.40–1.20)
GFR: 90.43 mL/min (ref >60.00)
GLUCOSE: 87 mg/dL (ref 70–99)
POTASSIUM: 3.9 meq/L (ref 3.5–5.1)
Sodium: 139 mEq/L (ref 135–145)
Total Bilirubin: 0.5 mg/dL (ref 0.2–1.2)
Total Protein: 7.3 g/dL (ref 6.0–8.3)

## 2018-05-30 LAB — MICROALBUMIN / CREATININE URINE RATIO
Creatinine,U: 122.4 mg/dL
Microalb Creat Ratio: 1.9 mg/g (ref 0.0–30.0)
Microalb, Ur: 2.3 mg/dL — ABNORMAL HIGH (ref 0.0–1.9)

## 2018-05-30 LAB — LIPID PANEL
CHOLESTEROL: 201 mg/dL — AB (ref 0–200)
HDL: 45.7 mg/dL (ref >39.00)
LDL Cholesterol: 126 mg/dL — ABNORMAL HIGH (ref 0–99)
NonHDL: 155
TRIGLYCERIDES: 143 mg/dL (ref 0.0–149.0)
Total CHOL/HDL Ratio: 4
VLDL: 28.6 mg/dL (ref 0.0–40.0)

## 2018-05-30 LAB — HEMOGLOBIN A1C: Hgb A1c MFr Bld: 5.5 % (ref 4.6–6.5)

## 2018-05-30 LAB — SEDIMENTATION RATE: SED RATE: 14 mm/h (ref 0–30)

## 2018-05-30 LAB — TSH: TSH: 1.18 u[IU]/mL (ref 0.35–4.50)

## 2018-05-30 MED ORDER — HYDROCOD POLST-CPM POLST ER 10-8 MG/5ML PO SUER: 5.0000 mL | Freq: Every evening | ORAL | 0 refills | Status: DC | PRN

## 2018-05-30 NOTE — Patient Instructions (Signed)
I have sent Tussionex to your pharmacy for your nighttime cough   A non narcotic alternative is Benadryl 30 minutes prior to bedtime,  Plus Delysm or robitussin    The new "normal"  blood pressure  Is  120/70 or less.  Please continue to check your blood pressure a few times per month and if your "untreated" readings are higher  Than 120/70,  We should start a medication .  If there is protein in your urine today,  I recommend starting one NOW .   DEXA scan ordered.     Health Maintenance for Postmenopausal Women Menopause is a normal process in which your reproductive ability comes to an end. This process happens gradually over a span of months to years, usually between the ages of 62 and 13. Menopause is complete when you have missed 12 consecutive menstrual periods. It is important to talk with your health care provider about some of the most common conditions that affect postmenopausal women, such as heart disease, cancer, and bone loss (osteoporosis). Adopting a healthy lifestyle and getting preventive care can help to promote your health and wellness. Those actions can also lower your chances of developing some of these common conditions. What should I know about menopause? During menopause, you may experience a number of symptoms, such as:  Moderate-to-severe hot flashes.  Night sweats.  Decrease in sex drive.  Mood swings.  Headaches.  Tiredness.  Irritability.  Memory problems.  Insomnia. Choosing to treat or not to treat menopausal changes is an individual decision that you make with your health care provider. What should I know about hormone replacement therapy and supplements? Hormone therapy products are effective for treating symptoms that are associated with menopause, such as hot flashes and night sweats. Hormone replacement carries certain risks, especially as you become older. If you are thinking about using estrogen or estrogen with progestin treatments,  discuss the benefits and risks with your health care provider. What should I know about heart disease and stroke? Heart disease, heart attack, and stroke become more likely as you age. This may be due, in part, to the hormonal changes that your body experiences during menopause. These can affect how your body processes dietary fats, triglycerides, and cholesterol. Heart attack and stroke are both medical emergencies. There are many things that you can do to help prevent heart disease and stroke:  Have your blood pressure checked at least every 1-2 years. High blood pressure causes heart disease and increases the risk of stroke.  If you are 31-4 years old, ask your health care provider if you should take aspirin to prevent a heart attack or a stroke.  Do not use any tobacco products, including cigarettes, chewing tobacco, or electronic cigarettes. If you need help quitting, ask your health care provider.  It is important to eat a healthy diet and maintain a healthy weight. ? Be sure to include plenty of vegetables, fruits, low-fat dairy products, and lean protein. ? Avoid eating foods that are high in solid fats, added sugars, or salt (sodium).  Get regular exercise. This is one of the most important things that you can do for your health. ? Try to exercise for at least 150 minutes each week. The type of exercise that you do should increase your heart rate and make you sweat. This is known as moderate-intensity exercise. ? Try to do strengthening exercises at least twice each week. Do these in addition to the moderate-intensity exercise.  Know your numbers.Ask your health  care provider to check your cholesterol and your blood glucose. Continue to have your blood tested as directed by your health care provider.  What should I know about cancer screening? There are several types of cancer. Take the following steps to reduce your risk and to catch any cancer development as early as  possible. Breast Cancer  Practice breast self-awareness. ? This means understanding how your breasts normally appear and feel. ? It also means doing regular breast self-exams. Let your health care provider know about any changes, no matter how small.  If you are 74 or older, have a clinician do a breast exam (clinical breast exam or CBE) every year. Depending on your age, family history, and medical history, it may be recommended that you also have a yearly breast X-ray (mammogram).  If you have a family history of breast cancer, talk with your health care provider about genetic screening.  If you are at high risk for breast cancer, talk with your health care provider about having an MRI and a mammogram every year.  Breast cancer (BRCA) gene test is recommended for women who have family members with BRCA-related cancers. Results of the assessment will determine the need for genetic counseling and BRCA1 and for BRCA2 testing. BRCA-related cancers include these types: ? Breast. This occurs in males or females. ? Ovarian. ? Tubal. This may also be called fallopian tube cancer. ? Cancer of the abdominal or pelvic lining (peritoneal cancer). ? Prostate. ? Pancreatic. Cervical, Uterine, and Ovarian Cancer Your health care provider may recommend that you be screened regularly for cancer of the pelvic organs. These include your ovaries, uterus, and vagina. This screening involves a pelvic exam, which includes checking for microscopic changes to the surface of your cervix (Pap test).  For women ages 21-65, health care providers may recommend a pelvic exam and a Pap test every three years. For women ages 57-65, they may recommend the Pap test and pelvic exam, combined with testing for human papilloma virus (HPV), every five years. Some types of HPV increase your risk of cervical cancer. Testing for HPV may also be done on women of any age who have unclear Pap test results.  Other health care providers  may not recommend any screening for nonpregnant women who are considered low risk for pelvic cancer and have no symptoms. Ask your health care provider if a screening pelvic exam is right for you.  If you have had past treatment for cervical cancer or a condition that could lead to cancer, you need Pap tests and screening for cancer for at least 20 years after your treatment. If Pap tests have been discontinued for you, your risk factors (such as having a new sexual partner) need to be reassessed to determine if you should start having screenings again. Some women have medical problems that increase the chance of getting cervical cancer. In these cases, your health care provider may recommend that you have screening and Pap tests more often.  If you have a family history of uterine cancer or ovarian cancer, talk with your health care provider about genetic screening.  If you have vaginal bleeding after reaching menopause, tell your health care provider.  There are currently no reliable tests available to screen for ovarian cancer. Lung Cancer Lung cancer screening is recommended for adults 42-78 years old who are at high risk for lung cancer because of a history of smoking. A yearly low-dose CT scan of the lungs is recommended if you:  Currently  smoke.  Have a history of at least 30 pack-years of smoking and you currently smoke or have quit within the past 15 years. A pack-year is smoking an average of one pack of cigarettes per day for one year. Yearly screening should:  Continue until it has been 15 years since you quit.  Stop if you develop a health problem that would prevent you from having lung cancer treatment. Colorectal Cancer  This type of cancer can be detected and can often be prevented.  Routine colorectal cancer screening usually begins at age 44 and continues through age 15.  If you have risk factors for colon cancer, your health care provider may recommend that you be  screened at an earlier age.  If you have a family history of colorectal cancer, talk with your health care provider about genetic screening.  Your health care provider may also recommend using home test kits to check for hidden blood in your stool.  A small camera at the end of a tube can be used to examine your colon directly (sigmoidoscopy or colonoscopy). This is done to check for the earliest forms of colorectal cancer.  Direct examination of the colon should be repeated every 5-10 years until age 45. However, if early forms of precancerous polyps or small growths are found or if you have a family history or genetic risk for colorectal cancer, you may need to be screened more often. Skin Cancer  Check your skin from head to toe regularly.  Monitor any moles. Be sure to tell your health care provider: ? About any new moles or changes in moles, especially if there is a change in a mole's shape or color. ? If you have a mole that is larger than the size of a pencil eraser.  If any of your family members has a history of skin cancer, especially at a young age, talk with your health care provider about genetic screening.  Always use sunscreen. Apply sunscreen liberally and repeatedly throughout the day.  Whenever you are outside, protect yourself by wearing long sleeves, pants, a wide-brimmed hat, and sunglasses. What should I know about osteoporosis? Osteoporosis is a condition in which bone destruction happens more quickly than new bone creation. After menopause, you may be at an increased risk for osteoporosis. To help prevent osteoporosis or the bone fractures that can happen because of osteoporosis, the following is recommended:  If you are 98-40 years old, get at least 1,000 mg of calcium and at least 600 mg of vitamin D per day.  If you are older than age 69 but younger than age 28, get at least 1,200 mg of calcium and at least 600 mg of vitamin D per day.  If you are older than  age 74, get at least 1,200 mg of calcium and at least 800 mg of vitamin D per day. Smoking and excessive alcohol intake increase the risk of osteoporosis. Eat foods that are rich in calcium and vitamin D, and do weight-bearing exercises several times each week as directed by your health care provider. What should I know about how menopause affects my mental health? Depression may occur at any age, but it is more common as you become older. Common symptoms of depression include:  Low or sad mood.  Changes in sleep patterns.  Changes in appetite or eating patterns.  Feeling an overall lack of motivation or enjoyment of activities that you previously enjoyed.  Frequent crying spells. Talk with your health care provider if  you think that you are experiencing depression. What should I know about immunizations? It is important that you get and maintain your immunizations. These include:  Tetanus, diphtheria, and pertussis (Tdap) booster vaccine.  Influenza every year before the flu season begins.  Pneumonia vaccine.  Shingles vaccine. Your health care provider may also recommend other immunizations. This information is not intended to replace advice given to you by your health care provider. Make sure you discuss any questions you have with your health care provider. Document Released: 07/10/2005 Document Revised: 12/06/2015 Document Reviewed: 02/19/2015 [employer] Interactive Patient Education  2019 [employer].

## 2018-05-30 NOTE — Progress Notes (Signed)
Patient ID: Lisa Green, female    DOB: [birth year]  Age: 67 y.o. MRN: [id number]  The patient is here for annual preventive examination and management of other chronic and acute problems.    The risk factors are reflected in the social history.  The roster of all physicians providing medical care to patient - is listed in the Snapshot section of the chart.  Activities of daily living:  The patient is 100% independent in all ADLs: dressing, toileting, feeding as well as independent mobility  Home safety : The patient has smoke detectors in the home. They wear seatbelts.  There are no firearms at home. There is no violence in the home.   There is no risks for hepatitis, STDs or HIV. There is no   history of blood transfusion. They have no travel history to infectious disease endemic areas of the world.  The patient has seen their dentist in the last six month. They have seen their eye doctor in the last year. They admit to slight hearing difficulty with regard to whispered voices and some television programs.  They have deferred audiologic testing in the last year.  They do not  have excessive sun exposure. Discussed the need for sun protection: hats, long sleeves and use of sunscreen if there is significant sun exposure.   Diet: the importance of a healthy diet is discussed. They do have a healthy diet.  The benefits of regular aerobic exercise were discussed. She walks 3 times per week ,  30 minutes, but has gained 6 lbs since last year . BMI is now 36  Depression screen: there are no signs or vegative symptoms of depression- irritability, change in appetite, anhedonia, sadness/tearfullness.  Cognitive assessment: the patient manages all their financial and personal affairs and is actively engaged. They could relate day,date,year and events; recalled 2/3 objects at 3 minutes; performed clock-face test normally.  The following portions of the patient's history were reviewed and updated as  appropriate: allergies, current medications, past family history, past medical history,  past surgical history, past social history  and problem list.  Visual acuity was not assessed per patient preference since she has regular follow up with her ophthalmologist. Hearing and body mass index were assessed and reviewed.   During the course of the visit the patient was educated and counseled about appropriate screening and preventive services including : fall prevention , diabetes screening, nutrition counseling, colorectal cancer screening, and recommended immunizations.    CC: The primary encounter diagnosis was Visit for preventive health examination. Diagnoses of Thumb pain, right, Hyperlipidemia with target LDL less than 100, Obesity (BMI 30.0-34.9), Elevated blood-pressure reading without diagnosis of hypertension, Screening for osteoporosis, Need for 23-polyvalent pneumococcal polysaccharide vaccine, Hepatic steatosis, and Essential hypertension were also pertinent to this visit.  S/p right sided CTS  Surgery by [facility] 0[facility]   Numbness gone  However, her right thumb has appeared to be swollen and with restricted ROM. Using aleve twice daily for 2 weeks with no improvement.  No history of trauma or overuse. She is left handed     History Zaria has a past medical history of Diverticulosis (2013), Hyperlipidemia, and Obesity (BMI 30.0-34.9).   She has a past surgical history that includes Cesarean section (1981); Cesarean section (1983); Abdominal hysterectomy; Cosmetic surgery; and Endovenous ablation saphenous vein w/ laser (2008).   Her family history includes Cancer (age of onset: 21) in her sister; Coronary artery disease in her paternal grandfather, paternal grandmother, and  paternal uncle; Coronary artery disease (age of onset: 84) in her father; Heart disease in her brother, mother, paternal grandfather, paternal grandmother, and paternal uncle; Stroke in her father.She reports  that she has never smoked. She has never used smokeless tobacco. She reports current alcohol use. She reports that she does not use drugs.  Outpatient Medications Prior to Visit  Medication Sig Dispense Refill  . rosuvastatin (CRESTOR) 10 MG tablet TAKE 1 TABLET (10 MG TOTAL) DAILY BY MOUTH. 30 tablet 2  . amoxicillin (AMOXIL) 875 MG tablet Take 1 tablet (875 mg total) by mouth 2 (two) times daily. (Patient not taking: Reported on 05/30/2018) 20 tablet 0  . benzonatate (TESSALON PERLES) 100 MG capsule Take 2 capsules (200 mg total) by mouth 3 (three) times daily as needed for cough. (Patient not taking: Reported on 05/30/2018) 15 capsule 0  . fexofenadine-pseudoephedrine (ALLEGRA-D) 60-120 MG 12 hr tablet Take 1 tablet by mouth 2 (two) times daily. 20 tablet 0   No facility-administered medications prior to visit.     Review of Systems  Patient denies headache, fevers, malaise, unintentional weight loss, skin rash, eye pain, sinus congestion and sinus pain, sore throat, dysphagia,  hemoptysis , cough, dyspnea, wheezing, chest pain, palpitations, orthopnea, edema, abdominal pain, nausea, melena, diarrhea, constipation, flank pain, dysuria, hematuria, urinary  Frequency, nocturia, numbness, tingling, seizures,  Focal weakness, Loss of consciousness,  Tremor, insomnia, depression, anxiety, and suicidal ideation.     Objective:  BP 136/88 (BP Location: Left Arm, Patient Position: Sitting, Cuff Size: Large)   Pulse 73   Temp 98.3 F (36.8 C) (Oral)   Resp 14   Ht _0  (1.651 m)   Wt 219 lb 3.2 oz (99.4 kg)   SpO2 97%   BMI 36.48 kg/m   Physical Exam   General appearance: alert, cooperative and appears stated age Head: Normocephalic, without obvious abnormality, atraumatic Eyes: conjunctivae/corneas clear. PERRL, EOM's intact. Fundi benign. Ears: normal TM's and external ear canals both ears Nose: Nares normal. Septum midline. Mucosa normal. No drainage or sinus tenderness. Throat:  lips, mucosa, and tongue normal; teeth and gums normal Neck: no adenopathy, no carotid bruit, no JVD, supple, symmetrical, trachea midline and thyroid not enlarged, symmetric, no tenderness/mass/nodules Lungs: clear to auscultation bilaterally Breasts: normal appearance, no masses or tenderness Heart: regular rate and rhythm, S1, S2 normal, no murmur, click, rub or gallop Abdomen: soft, non-tender; bowel sounds normal; no masses,  no organomegaly Extremities: right thumb diffusely swollen,  Non tender. Other extremities normal, atraumatic, no cyanosis or edema Pulses: 2+ and symmetric Skin: Skin color, texture, turgor normal. No rashes or lesions Neurologic: Alert and oriented X 3, normal strength and tone. Normal symmetric reflexes. Normal coordination and gait.      Assessment & Plan:   Problem List Items Addressed This Visit    Hepatic steatosis    Secondary to obesity and lifestyle with prediabetes detected on prior A1c screening.  lfts have normalized and a1c is trending down.  Lab Results  Component Value Date   ALT 26 05/30/2018   AST 23 05/30/2018   ALKPHOS 77 05/30/2018   BILITOT 0.5 05/30/2018         Hyperlipidemia with target LDL less than 100    Managed with Crestor.  Continue 10 mg daily .  lfts normal.   Lab Results  Component Value Date   CHOL 201 (H) 05/30/2018   HDL 45.70 05/30/2018   LDLCALC 126 (H) 05/30/2018   TRIG 143.0 05/30/2018  CHOLHDL 4 05/30/2018         Relevant Medications   telmisartan (MICARDIS) 20 MG tablet   Other Relevant Orders   Lipid panel (Completed)   Hypertension    Recommend treating given microscopic proteinuria on today's screen.  telmisartan prescribed.  She will need an RN /lab visit in one week for BP check and a bMET  Lab Results  Component Value Date   CREATININE 0.69 05/30/2018   Lab Results  Component Value Date   NA 139 05/30/2018   K 3.9 05/30/2018   CL 102 05/30/2018   CO2 29 05/30/2018   Lab Results   Component Value Date   MICROALBUR 2.3 (H) 05/30/2018         Relevant Medications   telmisartan (MICARDIS) 20 MG tablet   Other Relevant Orders   Basic metabolic panel   Obesity (BMI 30.0-34.9)    I have addressed  BMI and recommended wt loss of 10% of body weight over the next 6 months using a low fat, low starch, high protein  fruit/vegetable based Mediterranean diet and 30 minutes of aerobic exercise a minimum of 5 days per week.        Relevant Orders   Comprehensive metabolic panel (Completed)   Hemoglobin A1c (Completed)   TSH (Completed)   Thumb pain, right    With diffuse swelling since her carpal tunnel release several months ago.  Normal x rays and ESR,  Probable OA vs venous outflow obstruction following the CT release surgery.  Will refer to vascular for evaluation with doppler      Relevant Orders   DG Finger Thumb Right (Completed)   Sedimentation rate (Completed)   Ambulatory referral to Vascular Surgery   Visit for preventive health examination - Primary    age appropriate education and counseling updated, referrals for preventative services and immunizations addressed, dietary and smoking counseling addressed, most recent labs reviewed.  I have personally reviewed and have noted:  1) the patient's medical and social history 2) The pt's use of alcohol, tobacco, and illicit drugs 3) The patient's current medications and supplements 4) Functional ability including ADL's, fall risk, home safety risk, hearing and visual impairment 5) Diet and physical activities 6) Evidence for depression or mood disorder 7) The patient's height, weight, and BMI have been recorded in the chart  I have made referrals, and provided counseling and education based on review of the above       Other Visit Diagnoses    Elevated blood-pressure reading without diagnosis of hypertension       Relevant Orders   Microalbumin / creatinine urine ratio (Completed)   Screening for  osteoporosis       Relevant Orders   DG Bone Density   Need for 23-polyvalent pneumococcal polysaccharide vaccine       Relevant Orders   Pneumococcal polysaccharide vaccine 23-valent greater than or equal to 2yo subcutaneous/IM (Completed)      I have discontinued Mariann Laster A. Thornton's amoxicillin, fexofenadine-pseudoephedrine, and benzonatate. I am also having her start on telmisartan. Additionally, I am having her maintain her rosuvastatin and chlorpheniramine-HYDROcodone.  Meds ordered this encounter  Medications  . DISCONTD: chlorpheniramine-HYDROcodone (TUSSIONEX PENNKINETIC ER) 10-8 MG/5ML SUER    Sig: Take 5 mLs by mouth at bedtime as needed for cough.    Dispense:  140 mL    Refill:  0  . chlorpheniramine-HYDROcodone (TUSSIONEX PENNKINETIC ER) 10-8 MG/5ML SUER    Sig: Take 5 mLs by mouth at bedtime as  needed for cough.    Dispense:  140 mL    Refill:  0  . telmisartan (MICARDIS) 20 MG tablet    Sig: Take 1 tablet (20 mg total) by mouth daily.    Dispense:  30 tablet    Refill:  5    Medications Discontinued During This Encounter  Medication Reason  . amoxicillin (AMOXIL) 875 MG tablet Completed Course  . benzonatate (TESSALON PERLES) 100 MG capsule Completed Course  . fexofenadine-pseudoephedrine (ALLEGRA-D) 60-120 MG 12 hr tablet Completed Course  . chlorpheniramine-HYDROcodone (TUSSIONEX PENNKINETIC ER) 10-8 MG/5ML SUER     Follow-up: No follow-ups on file.   Crecencio Mc, MD

## 2018-05-31 ENCOUNTER — Encounter: Payer: Self-pay | Admitting: Internal Medicine

## 2018-05-31 DIAGNOSIS — M79644 Pain in right finger(s): Secondary | ICD-10-CM | POA: Insufficient documentation

## 2018-05-31 DIAGNOSIS — I10 Essential (primary) hypertension: Secondary | ICD-10-CM | POA: Insufficient documentation

## 2018-05-31 MED ORDER — TELMISARTAN 20 MG PO TABS: 20.0000 mg | ORAL_TABLET | Freq: Every day | ORAL | 5 refills | Status: DC

## 2018-05-31 NOTE — Assessment & Plan Note (Signed)
I have addressed  BMI and recommended wt loss of 10% of body weight over the next 6 months using a low fat, low starch, high protein  fruit/vegetable based Mediterranean diet and 30 minutes of aerobic exercise a minimum of 5 days per week.   

## 2018-05-31 NOTE — Assessment & Plan Note (Addendum)
Managed with Crestor.  Continue 10 mg daily .  lfts normal.   Lab Results  Component Value Date   CHOL 201 (H) 05/30/2018   HDL 45.70 05/30/2018   LDLCALC 126 (H) 05/30/2018   TRIG 143.0 05/30/2018   CHOLHDL 4 05/30/2018

## 2018-05-31 NOTE — Assessment & Plan Note (Signed)
Recommend treating given microscopic proteinuria on today's screen.  telmisartan prescribed.  She will need an RN /lab visit in one week for BP check and a bMET  Lab Results  Component Value Date   CREATININE 0.69 05/30/2018   Lab Results  Component Value Date   NA 139 05/30/2018   K 3.9 05/30/2018   CL 102 05/30/2018   CO2 29 05/30/2018   Lab Results  Component Value Date   MICROALBUR 2.3 (H) 05/30/2018

## 2018-05-31 NOTE — Assessment & Plan Note (Signed)

## 2018-05-31 NOTE — Assessment & Plan Note (Signed)
Secondary to obesity and lifestyle with prediabetes detected on prior A1c screening.  lfts have normalized and a1c is trending down.  Lab Results  Component Value Date   ALT 26 05/30/2018   AST 23 05/30/2018   ALKPHOS 77 05/30/2018   BILITOT 0.5 05/30/2018

## 2018-05-31 NOTE — Assessment & Plan Note (Signed)
With diffuse swelling since her carpal tunnel release several months ago.  Normal x rays and ESR,  Probable OA vs venous outflow obstruction following the CT release surgery.  Will refer to vascular for evaluation with doppler 

## 2018-06-06 ENCOUNTER — Ambulatory Visit
Admission: RE | Admit: 2018-06-06 | Discharge: 2018-06-06 | Disposition: A | Discharge status: Home / Self Care | Payer: 59 | Source: Ambulatory Visit | Attending: Internal Medicine | Admitting: Internal Medicine

## 2018-06-06 ENCOUNTER — Other Ambulatory Visit: Payer: Self-pay | Admitting: Internal Medicine

## 2018-06-06 DIAGNOSIS — Z1231 Encounter for screening mammogram for malignant neoplasm of breast: Secondary | ICD-10-CM

## 2018-06-06 DIAGNOSIS — N6489 Other specified disorders of breast: Secondary | ICD-10-CM

## 2018-06-06 DIAGNOSIS — R928 Other abnormal and inconclusive findings on diagnostic imaging of breast: Secondary | ICD-10-CM

## 2018-06-13 ENCOUNTER — Ambulatory Visit
Admission: RE | Admit: 2018-06-13 | Discharge: 2018-06-13 | Disposition: A | Discharge status: Home / Self Care | Payer: 59 | Source: Ambulatory Visit | Attending: Internal Medicine | Admitting: Internal Medicine

## 2018-06-13 DIAGNOSIS — R928 Other abnormal and inconclusive findings on diagnostic imaging of breast: Secondary | ICD-10-CM | POA: Insufficient documentation

## 2018-06-13 DIAGNOSIS — N6489 Other specified disorders of breast: Secondary | ICD-10-CM

## 2018-06-20 ENCOUNTER — Encounter: Payer: Self-pay | Admitting: Internal Medicine

## 2018-06-21 ENCOUNTER — Other Ambulatory Visit (INDEPENDENT_AMBULATORY_CARE_PROVIDER_SITE_OTHER): Payer: Self-pay | Admitting: Nurse Practitioner

## 2018-06-21 DIAGNOSIS — Z9889 Other specified postprocedural states: Secondary | ICD-10-CM

## 2018-06-21 DIAGNOSIS — M7989 Other specified soft tissue disorders: Secondary | ICD-10-CM

## 2018-06-24 ENCOUNTER — Encounter (INDEPENDENT_AMBULATORY_CARE_PROVIDER_SITE_OTHER): Payer: Self-pay | Admitting: Nurse Practitioner

## 2018-06-24 ENCOUNTER — Ambulatory Visit (INDEPENDENT_AMBULATORY_CARE_PROVIDER_SITE_OTHER): Payer: 59

## 2018-06-24 ENCOUNTER — Ambulatory Visit (INDEPENDENT_AMBULATORY_CARE_PROVIDER_SITE_OTHER): Payer: 59 | Admitting: Nurse Practitioner

## 2018-06-24 VITALS — BP 147/91 | HR 63 | Resp 16 | Ht 65.0 in | Wt 214.0 lb

## 2018-06-24 DIAGNOSIS — I10 Essential (primary) hypertension: Secondary | ICD-10-CM

## 2018-06-24 DIAGNOSIS — M79644 Pain in right finger(s): Secondary | ICD-10-CM

## 2018-06-24 DIAGNOSIS — Z9889 Other specified postprocedural states: Secondary | ICD-10-CM | POA: Diagnosis not present

## 2018-06-24 DIAGNOSIS — M7989 Other specified soft tissue disorders: Secondary | ICD-10-CM

## 2018-06-24 DIAGNOSIS — E785 Hyperlipidemia, unspecified: Secondary | ICD-10-CM

## 2018-06-24 NOTE — Progress Notes (Signed)
Subjective:    Patient ID: Lisa Green, female    DOB: March 28, 1951, 68 y.o.   MRN: [id number] Chief Complaint  Patient presents with  . Establish Care    HPI  Lisa Green is a 68 y.o. female that previously underwent a carpal tunnel release surgery in April 2019.  Prior to surgery she states that her third and fourth digit were completely numb and painful with numbness in her thumb as well.  Shortly following the surgery she noticed that her thumb became swollen as well as well as decreased movement.  She also noticed that there was a catching within her MCP joint.  This is not consistent with her movement of her left thumb.  She subsequently presented to her primary care physician, Dr. Derrel Nip, who referred her to rule out possible vascular causes.  The patient denies amaurosis fugax, TIA there is no history of CVA. The patient denies past history of DVT, PE or superficial thrombophlebitis. The patient denies claudication-like symptoms, no rest pain, no past history of open wounds or sores of the hands.  Previous endovascular laser ablation of her varicose veins.  The patient had an upper extremity venous duplex today which revealed no evidence of DVT in the right upper extremity.  No evidence of superficial venous thrombosis in the upper extremity either.   Past Medical History:  Diagnosis Date  . Diverticulosis 2013   colonoscopy, [place]  . Hyperlipidemia   . Obesity (BMI 30.0-34.9)     Past Surgical History:  Procedure Laterality Date  . ABDOMINAL HYSTERECTOMY     with wedge resection of right ovary , normal path  . [place]  . CESAREAN SECTION  1983  . COSMETIC SURGERY    . ENDOVENOUS ABLATION SAPHENOUS VEIN W/ LASER  2008   for varicose veins,  [place], [place]    Social History   Socioeconomic History  . Marital status: Married    Spouse name: Not on file  . Number of children: Not on file  . Years of education: Not on file  . Highest  education level: Not on file  Occupational History  . Occupation: Production assistant, radio    Comment: OT at [employer]  . Financial resource strain: Not on file  . Food insecurity:    Worry: Not on file    Inability: Not on file  . Transportation needs:    Medical: Not on file    Non-medical: Not on file  Tobacco Use  . Smoking status: Never Smoker  . Smokeless tobacco: Never Used  Substance and Sexual Activity  . Alcohol use: Yes    Comment: 2 oz red wine daily  . Drug use: No  . Sexual activity: Not on file  Lifestyle  . Physical activity:    Days per week: Not on file    Minutes per session: Not on file  . Stress: Not on file  Relationships  . Social connections:    Talks on phone: Not on file    Gets together: Not on file    Attends religious service: Not on file    Active member of club or organization: Not on file    Attends meetings of clubs or organizations: Not on file    Relationship status: Not on file  . Intimate partner violence:    Fear of current or ex partner: Not on file    Emotionally abused: Not on file  Physically abused: Not on file    Forced sexual activity: Not on file  Other Topics Concern  . Not on file  Social History Narrative  . Not on file    Family History  Problem Relation Age of Onset  . Coronary artery disease Father 1  . Stroke Father   . Coronary artery disease Paternal Uncle   . Heart disease Paternal Uncle   . Coronary artery disease Paternal Grandmother   . Heart disease Paternal Grandmother   . Coronary artery disease Paternal Grandfather   . Heart disease Paternal Grandfather   . Heart disease Mother   . Cancer Sister 4       ovarian ca  . Heart disease Brother   . Breast cancer Neg Hx     Allergies  Allergen Reactions  . Wasp Venom Protein Anaphylaxis  . Insect Extract Allergy Skin Test     Insect Stings     Review of Systems   Review of Systems: Negative Unless Checked Constitutional:  [] Weight loss  [] Fever  [] Chills Cardiac: [] Chest pain   []  Atrial Fibrillation  [] Palpitations   [] Shortness of breath when laying flat   [] Shortness of breath with exertion. [] Shortness of breath at rest Vascular:  [] Pain in legs with walking   [] Pain in legs with standing [] Pain in legs when laying flat   [] Claudication    [] Pain in feet when laying flat    [] History of DVT   [] Phlebitis   [] Swelling in legs   [] Varicose veins   [] Non-healing ulcers Pulmonary:   [] Uses home oxygen   [] Productive cough   [] Hemoptysis   [] Wheeze  [] COPD   [] Asthma Neurologic:  [] Dizziness   [] Seizures  [] Blackouts [] History of stroke   [] History of TIA  [] Aphasia   [] Temporary Blindness   [] Weakness or numbness in arm   [] Weakness or numbness in leg Musculoskeletal:   [] Joint swelling   [] Joint pain   [] Low back pain  []  History of Knee Replacement [] Arthritis [] back Surgeries  []  Spinal Stenosis    Hematologic:  [] Easy bruising  [] Easy bleeding   [] Hypercoagulable state   [] Anemic Gastrointestinal:  [] Diarrhea   [] Vomiting  [] Gastroesophageal reflux/heartburn   [] Difficulty swallowing. [] Abdominal pain Genitourinary:  [] Chronic kidney disease   [] Difficult urination  [] Anuric   [] Blood in urine [] Frequent urination  [] Burning with urination   [] Hematuria Skin:  [] Rashes   [] Ulcers [] Wounds Psychological:  [] History of anxiety   []  History of major depression  []  Memory Difficulties     Objective:   Physical Exam  BP (!) 147/91 (BP Location: Right Arm, Patient Position: Sitting)   Pulse 63   Resp 16   Ht 5\' 5"  (1.651 m)   Wt 214 lb (97.1 kg)   BMI 35.61 kg/m   Gen: WD/WN, NAD Head: [place]/AT, No temporalis wasting.  Ear/Nose/Throat: Hearing grossly intact, nares w/o erythema or drainage Eyes: PER, EOMI, sclera nonicteric.  Neck: Supple, no masses.  No JVD.  Pulmonary:  Good air movement, no use of accessory muscles.  Cardiac: RRR Vascular:  Vessel Right Left  Radial Palpable Palpable    Gastrointestinal: soft, non-distended. No guarding/no peritoneal signs.  Musculoskeletal: M/S 5/5 throughout.  No deformity or atrophy.  Neurologic: Pain and light touch intact in extremities.  Symmetrical.  Speech is fluent. Motor exam as listed above. Psychiatric: Judgment intact, Mood & affect appropriate for pt's clinical situation. Dermatologic: No Venous rashes. No Ulcers Noted.  No changes consistent with cellulitis. Lymph : No Cervical  lymphadenopathy, no lichenification or skin changes of chronic lymphedema.      Assessment & Plan:   1. Thumb pain, right The patient has no evidence of venous outflow obstruction, DVT or superficial venous thrombosis in her upper extremity.  No vascular component is found to determine because of swelling and pain in her right thumb.  Further work-up is deferred to her primary care physician as well as previous orthopedic hand surgeon.  Patient will follow-up with [place] as needed.  2. Essential hypertension Continue antihypertensive medications as already ordered, these medications have been reviewed and there are no changes at this time.   3. Hyperlipidemia with target LDL less than 100 Continue statin as ordered and reviewed, no changes at this time    Current Outpatient Medications on File Prior to Visit  Medication Sig Dispense Refill  . rosuvastatin (CRESTOR) 10 MG tablet TAKE 1 TABLET (10 MG TOTAL) DAILY BY MOUTH. 30 tablet 2  . telmisartan (MICARDIS) 20 MG tablet Take 1 tablet (20 mg total) by mouth daily. 30 tablet 5   No current facility-administered medications on file prior to visit.     There are no Patient Instructions on file for this visit. Return if symptoms worsen or fail to improve.   Kris Hartmann, NP  This note was completed with Sales executive.  Any errors are purely unintentional.

## 2018-07-05 ENCOUNTER — Other Ambulatory Visit: Payer: Self-pay | Admitting: Internal Medicine

## 2018-10-11 MED FILL — ROSUVASTATIN CALCIUM 10 MG: 10 | 90 days supply | Qty: 90 | Fill #0

## 2018-10-11 MED FILL — TELMISARTAN 20 MG TABS: 20 | 30 days supply | Qty: 30 | Fill #0

## 2018-10-19 DIAGNOSIS — H2513 Age-related nuclear cataract, bilateral: Secondary | ICD-10-CM | POA: Diagnosis not present

## 2018-11-16 ENCOUNTER — Other Ambulatory Visit (HOSPITAL_COMMUNITY): Payer: 59

## 2018-11-16 ENCOUNTER — Emergency Department: Payer: 59

## 2018-11-16 ENCOUNTER — Other Ambulatory Visit: Payer: Self-pay

## 2018-11-16 ENCOUNTER — Emergency Department
Admission: EM | Admit: 2018-11-16 | Discharge: 2018-11-16 | Disposition: A | Discharge status: Other Acute Inpatient Hospital | Payer: 59 | Attending: Emergency Medicine | Admitting: Emergency Medicine

## 2018-11-16 ENCOUNTER — Inpatient Hospital Stay: Admit: 2018-11-16 | Payer: 59

## 2018-11-16 ENCOUNTER — Encounter (HOSPITAL_COMMUNITY): Payer: Self-pay | Admitting: *Deleted

## 2018-11-16 ENCOUNTER — Encounter (HOSPITAL_COMMUNITY): Payer: Self-pay | Admitting: Certified Registered Nurse Anesthetist

## 2018-11-16 ENCOUNTER — Inpatient Hospital Stay (HOSPITAL_COMMUNITY)
Admission: EM | Admit: 2018-11-16 | Discharge: 2018-11-18 | DRG: 042 | Disposition: A | Discharge status: Home / Self Care | Payer: 59 | Source: Other Acute Inpatient Hospital | Attending: Neurology | Admitting: Neurology

## 2018-11-16 ENCOUNTER — Encounter (HOSPITAL_COMMUNITY)
Admission: EM | Disposition: A | Discharge status: Home / Self Care | Payer: Self-pay | Source: Other Acute Inpatient Hospital | Attending: Neurology

## 2018-11-16 ENCOUNTER — Encounter: Payer: Self-pay | Admitting: Emergency Medicine

## 2018-11-16 DIAGNOSIS — R220 Localized swelling, mass and lump, head: Secondary | ICD-10-CM | POA: Diagnosis not present

## 2018-11-16 DIAGNOSIS — Z6834 Body mass index (BMI) 34.0-34.9, adult: Secondary | ICD-10-CM

## 2018-11-16 DIAGNOSIS — Z9282 Status post administration of tPA (rtPA) in a different facility within the last 24 hours prior to admission to current facility: Secondary | ICD-10-CM | POA: Diagnosis not present

## 2018-11-16 DIAGNOSIS — I6930 Unspecified sequelae of cerebral infarction: Secondary | ICD-10-CM | POA: Diagnosis present

## 2018-11-16 DIAGNOSIS — I639 Cerebral infarction, unspecified: Secondary | ICD-10-CM | POA: Insufficient documentation

## 2018-11-16 DIAGNOSIS — R29810 Facial weakness: Secondary | ICD-10-CM | POA: Diagnosis present

## 2018-11-16 DIAGNOSIS — R29818 Other symptoms and signs involving the nervous system: Secondary | ICD-10-CM | POA: Diagnosis not present

## 2018-11-16 DIAGNOSIS — E669 Obesity, unspecified: Secondary | ICD-10-CM | POA: Diagnosis present

## 2018-11-16 DIAGNOSIS — I63411 Cerebral infarction due to embolism of right middle cerebral artery: Principal | ICD-10-CM | POA: Diagnosis present

## 2018-11-16 DIAGNOSIS — R4781 Slurred speech: Secondary | ICD-10-CM | POA: Diagnosis not present

## 2018-11-16 DIAGNOSIS — I351 Nonrheumatic aortic (valve) insufficiency: Secondary | ICD-10-CM | POA: Diagnosis not present

## 2018-11-16 DIAGNOSIS — Z20828 Contact with and (suspected) exposure to other viral communicable diseases: Secondary | ICD-10-CM | POA: Insufficient documentation

## 2018-11-16 DIAGNOSIS — Z86718 Personal history of other venous thrombosis and embolism: Secondary | ICD-10-CM

## 2018-11-16 DIAGNOSIS — Z79899 Other long term (current) drug therapy: Secondary | ICD-10-CM | POA: Diagnosis not present

## 2018-11-16 DIAGNOSIS — R29704 NIHSS score 4: Secondary | ICD-10-CM | POA: Diagnosis present

## 2018-11-16 DIAGNOSIS — Z90710 Acquired absence of both cervix and uterus: Secondary | ICD-10-CM | POA: Diagnosis not present

## 2018-11-16 DIAGNOSIS — E785 Hyperlipidemia, unspecified: Secondary | ICD-10-CM | POA: Diagnosis not present

## 2018-11-16 DIAGNOSIS — I10 Essential (primary) hypertension: Secondary | ICD-10-CM | POA: Insufficient documentation

## 2018-11-16 DIAGNOSIS — Z03818 Encounter for observation for suspected exposure to other biological agents ruled out: Secondary | ICD-10-CM | POA: Diagnosis not present

## 2018-11-16 DIAGNOSIS — E782 Mixed hyperlipidemia: Secondary | ICD-10-CM | POA: Diagnosis not present

## 2018-11-16 DIAGNOSIS — I6389 Other cerebral infarction: Secondary | ICD-10-CM | POA: Diagnosis not present

## 2018-11-16 DIAGNOSIS — R1310 Dysphagia, unspecified: Secondary | ICD-10-CM | POA: Diagnosis present

## 2018-11-16 DIAGNOSIS — R471 Dysarthria and anarthria: Secondary | ICD-10-CM | POA: Diagnosis present

## 2018-11-16 LAB — COMPREHENSIVE METABOLIC PANEL
ALT: 38 U/L (ref 0–44)
AST: 27 U/L (ref 15–41)
Albumin: 4.5 g/dL (ref 3.5–5.0)
Alkaline Phosphatase: 79 U/L (ref 38–126)
Anion gap: 11 (ref 5–15)
BUN: 13 mg/dL (ref 8–23)
CO2: 23 mmol/L (ref 22–32)
Calcium: 9.5 mg/dL (ref 8.9–10.3)
Chloride: 106 mmol/L (ref 98–111)
Creatinine, Ser: 0.69 mg/dL (ref 0.44–1.00)
GFR calc Af Amer: >60 mL/min (ref >60)
GFR calc non Af Amer: >60 mL/min (ref >60)
Glucose, Bld: 112 mg/dL — ABNORMAL HIGH (ref 70–99)
Potassium: 4 mmol/L (ref 3.5–5.1)
Sodium: 140 mmol/L (ref 135–145)
Total Bilirubin: 0.8 mg/dL (ref 0.3–1.2)
Total Protein: 7.7 g/dL (ref 6.5–8.1)

## 2018-11-16 LAB — GLUCOSE, CAPILLARY: Glucose-Capillary: 93 mg/dL (ref 70–99)

## 2018-11-16 LAB — CBC
HCT: 41.5 % (ref 36.0–46.0)
Hemoglobin: 14 g/dL (ref 12.0–15.0)
MCH: 31.4 pg (ref 26.0–34.0)
MCHC: 33.7 g/dL (ref 30.0–36.0)
MCV: 93 fL (ref 80.0–100.0)
Platelets: 235 10*3/uL (ref 150–400)
RBC: 4.46 MIL/uL (ref 3.87–5.11)
RDW: 12.6 % (ref 11.5–15.5)
WBC: 6 10*3/uL (ref 4.0–10.5)
nRBC: 0 % (ref 0.0–0.2)

## 2018-11-16 LAB — DIFFERENTIAL
Abs Immature Granulocytes: 0.02 10*3/uL (ref 0.00–0.07)
Basophils Absolute: 0 10*3/uL (ref 0.0–0.1)
Basophils Relative: 1 %
Eosinophils Absolute: 0.1 10*3/uL (ref 0.0–0.5)
Eosinophils Relative: 1 %
Immature Granulocytes: 0 %
Lymphocytes Relative: 33 %
Lymphs Abs: 2 10*3/uL (ref 0.7–4.0)
Monocytes Absolute: 0.4 10*3/uL (ref 0.1–1.0)
Monocytes Relative: 6 %
Neutro Abs: 3.5 10*3/uL (ref 1.7–7.7)
Neutrophils Relative %: 59 %

## 2018-11-16 LAB — SARS CORONAVIRUS 2 BY RT PCR (HOSPITAL ORDER, PERFORMED IN ~~LOC~~ HOSPITAL LAB): SARS Coronavirus 2: NEGATIVE

## 2018-11-16 SURGERY — IR WITH ANESTHESIA
Anesthesia: General

## 2018-11-16 MED ORDER — LABETALOL HCL 5 MG/ML IV SOLN: 10.0000 mg | INTRAVENOUS | Status: DC | PRN

## 2018-11-16 MED ORDER — STROKE: EARLY STAGES OF RECOVERY BOOK
Freq: Once | Status: AC
  Administered 2018-11-16: 14:00:00
  Filled 2018-11-16: qty 1

## 2018-11-16 MED ORDER — PANTOPRAZOLE SODIUM 40 MG PO TBEC
40.0000 mg | DELAYED_RELEASE_TABLET | Freq: Every day | ORAL | Status: DC
  Administered 2018-11-16 – 2018-11-18 (×3): 40 mg via ORAL
  Filled 2018-11-16 (×3): qty 1

## 2018-11-16 MED ORDER — HYDRALAZINE HCL 20 MG/ML IJ SOLN
10.0000 mg | Freq: Once | INTRAMUSCULAR | Status: AC
  Administered 2018-11-16: 10:00:00 10 mg via INTRAVENOUS

## 2018-11-16 MED ORDER — ACETAMINOPHEN 650 MG RE SUPP: 650.0000 mg | RECTAL | Status: DC | PRN

## 2018-11-16 MED ORDER — ALTEPLASE (STROKE) FULL DOSE INFUSION
0.9000 mg/kg | Freq: Once | INTRAVENOUS | Status: AC
  Administered 2018-11-16: 86 mg via INTRAVENOUS
  Filled 2018-11-16: qty 100

## 2018-11-16 MED ORDER — ACETAMINOPHEN 160 MG/5ML PO SOLN: 650.0000 mg | ORAL | Status: DC | PRN

## 2018-11-16 MED ORDER — ACETAMINOPHEN 325 MG PO TABS
650.0000 mg | ORAL_TABLET | ORAL | Status: DC | PRN
  Administered 2018-11-17: 650 mg via ORAL
  Filled 2018-11-16: qty 2

## 2018-11-16 MED ORDER — ROSUVASTATIN CALCIUM 5 MG PO TABS
10.0000 mg | ORAL_TABLET | Freq: Every day | ORAL | Status: DC
  Administered 2018-11-16: 10 mg via ORAL
  Filled 2018-11-16: qty 2

## 2018-11-16 MED ORDER — SODIUM CHLORIDE 0.9% FLUSH
3.0000 mL | Freq: Once | INTRAVENOUS | Status: AC
  Administered 2018-11-16: 3 mL via INTRAVENOUS

## 2018-11-16 MED ORDER — HYDRALAZINE HCL 20 MG/ML IJ SOLN: 10.0000 mg | INTRAMUSCULAR | Status: DC | PRN

## 2018-11-16 MED ORDER — IOHEXOL 350 MG/ML SOLN
75.0000 mL | Freq: Once | INTRAVENOUS | Status: AC | PRN
  Administered 2018-11-16: 75 mL via INTRAVENOUS

## 2018-11-16 MED ORDER — SODIUM CHLORIDE 0.9 % IV SOLN
INTRAVENOUS | Status: DC
  Administered 2018-11-16: 75 mL/h via INTRAVENOUS
  Administered 2018-11-17: 950 mL via INTRAVENOUS

## 2018-11-16 MED ORDER — SODIUM CHLORIDE 0.9 % IV SOLN
50.0000 mL | Freq: Once | INTRAVENOUS | Status: AC
  Administered 2018-11-16: 50 mL via INTRAVENOUS

## 2018-11-16 MED ORDER — SENNOSIDES-DOCUSATE SODIUM 8.6-50 MG PO TABS: 1.0000 | ORAL_TABLET | Freq: Every evening | ORAL | Status: DC | PRN

## 2018-11-16 MED ORDER — PANTOPRAZOLE SODIUM 40 MG IV SOLR: 40.0000 mg | Freq: Every day | INTRAVENOUS | Status: DC

## 2018-11-16 MED ORDER — NICARDIPINE HCL IN NACL 20-0.86 MG/200ML-% IV SOLN
3.0000 mg/h | INTRAVENOUS | Status: DC
  Administered 2018-11-16: 2.5 mg/h via INTRAVENOUS

## 2018-11-16 MED ORDER — HYDRALAZINE HCL 20 MG/ML IJ SOLN
10.0000 mg | INTRAMUSCULAR | Status: DC | PRN
  Administered 2018-11-17: 10 mg via INTRAVENOUS
  Filled 2018-11-16: qty 1

## 2018-11-16 NOTE — ED Notes (Signed)
Attempted to call report to ED, to answer at this time

## 2018-11-16 NOTE — ED Provider Notes (Signed)
[facility] Emergency Department Provider Note ____________________________________________   First MD Initiated Contact with Patient 11/16/18 1002     (approximate)  I have reviewed the triage vital signs and the nursing notes.   HISTORY  Chief Complaint Aphasia  Level 5 caveat: History of present illness limited due to dysarthria  HPI Lisa Green is a 68 y.o. female with PMH as noted below who presents with difficulty swallowing, acute onset around 830, associated with some drooling and right-sided facial droop.  The patient subsequently noted dysarthria (not aphasia).  She denies any weakness or numbness in her extremities.  No prior history of the symptoms.  Past Medical History:  Diagnosis Date  . Diverticulosis 2013   colonoscopy, [place]  . Hyperlipidemia   . Obesity (BMI 30.0-34.9)     Patient Active Problem List   Diagnosis Date Noted  . Hypertension 05/31/2018  . Thumb pain, right 05/31/2018  . Parotid mass 06/16/2015  . S/P abdominal hysterectomy and right salpingo-oophorectomy 06/14/2015  . Obesity (BMI 30.0-34.9)   . Visit for preventive health examination 03/06/2011  . Screening for breast cancer 02/03/2011  . Screening for colon cancer 02/03/2011  . Hepatic steatosis 02/03/2011  . Hyperlipidemia with target LDL less than 100 02/03/2011    Past Surgical History:  Procedure Laterality Date  . ABDOMINAL HYSTERECTOMY     with wedge resection of right ovary , normal path  . [place]  . CESAREAN SECTION  1983  . COSMETIC SURGERY    . ENDOVENOUS ABLATION SAPHENOUS VEIN W/ LASER  2008   for varicose veins,  [place], [place]    Prior to Admission medications   Medication Sig Start Date End Date Taking? Authorizing Provider  rosuvastatin (CRESTOR) 10 MG tablet TAKE 1 TABLET (10 MG TOTAL) DAILY BY MOUTH. 07/05/18   Crecencio Mc, MD  telmisartan (MICARDIS) 20 MG tablet Take 1 tablet (20 mg total) by mouth  daily. 05/31/18   Crecencio Mc, MD    Allergies Wasp venom protein and Insect extract allergy skin test  Family History  Problem Relation Age of Onset  . Coronary artery disease Father 65  . Stroke Father   . Coronary artery disease Paternal Uncle   . Heart disease Paternal Uncle   . Coronary artery disease Paternal Grandmother   . Heart disease Paternal Grandmother   . Coronary artery disease Paternal Grandfather   . Heart disease Paternal Grandfather   . Heart disease Mother   . Cancer Sister 53       ovarian ca  . Heart disease Brother   . Breast cancer Neg Hx     Social History Social History   Tobacco Use  . Smoking status: Never Smoker  . Smokeless tobacco: Never Used  Substance Use Topics  . Alcohol use: Yes    Comment: 2 oz red wine daily  . Drug use: No    Review of Systems Level 5 caveat: Review of systems limited due to dysarthria Constitutional: No fever. Eyes: No visual changes. Cardiovascular: Denies chest pain. Respiratory: Denies shortness of breath. Gastrointestinal: No vomiting. Neurological: Negative for headache.   ____________________________________________   PHYSICAL EXAM:  VITAL SIGNS: ED Triage Vitals  Enc Vitals Group     BP 11/16/18 0948 (!) 170/99     Pulse Rate 11/16/18 0948 70     Resp 11/16/18 0948 16     Temp 11/16/18 0948 97.8 F (36.6 C)  Temp Source 11/16/18 0948 Oral     SpO2 11/16/18 0948 98 %     Weight 11/16/18 0959 210 lb 12.2 oz (95.6 kg)     Height 11/16/18 0956 5' 6"  (1.676 m)     Head Circumference --      Peak Flow --      Pain Score 11/16/18 0956 0     Pain Loc --      Pain Edu? --      Excl. in [place]? --     Constitutional: Alert and oriented.  No acute distress. Eyes: Conjunctivae are normal.  EOMI.  PERRLA. Head: Atraumatic. Nose: No congestion/rhinnorhea. Mouth/Throat: Mucous membranes are moist.   Neck: Normal range of motion.  Cardiovascular: Good peripheral circulation. Respiratory:  Normal respiratory effort.   Gastrointestinal: No distention.  Musculoskeletal: Extremities warm and well perfused.  Neurologic: Dysarthric speech.  Right-sided facial droop.  5/5 motor strength and intact sensation to bilateral upper and lower extremities. Skin:  Skin is warm and dry. No rash noted. Psychiatric: Mood and affect are normal. Speech and behavior are normal.  ____________________________________________   LABS (all labs ordered are listed, but only abnormal results are displayed)  Labs Reviewed  COMPREHENSIVE METABOLIC PANEL - Abnormal; Notable for the following components:      Result Value   Glucose, Bld 112 (*)    All other components within normal limits  CBC  DIFFERENTIAL  GLUCOSE, CAPILLARY  PROTIME-INR  APTT  CBG MONITORING, ED  I-STAT CREATININE, ED   ____________________________________________  EKG  ED ECG REPORT I, Arta Silence, the attending physician, personally viewed and interpreted this ECG.  Date: 11/16/2018 EKG Time: 1008 Rate: 67 Rhythm: normal sinus rhythm QRS Axis: Right axis Intervals: normal ST/T Wave abnormalities: normal Narrative Interpretation: no evidence of acute ischemia  ____________________________________________  RADIOLOGY  CT head: Possible dense left MCA  ____________________________________________   PROCEDURES  Procedure(s) performed: No  Procedures  Critical Care performed: Yes  CRITICAL CARE Performed by: Arta Silence   Total critical care time: 30 minutes  Critical care time was exclusive of separately billable procedures and treating other patients.  Critical care was necessary to treat or prevent imminent or life-threatening deterioration.  Critical care was time spent personally by me on the following activities: development of treatment plan with patient and/or surrogate as well as nursing, discussions with consultants, evaluation of patient's response to treatment, examination  of patient, obtaining history from patient or surrogate, ordering and performing treatments and interventions, ordering and review of laboratory studies, ordering and review of radiographic studies, pulse oximetry and re-evaluation of patient's condition. ____________________________________________   INITIAL IMPRESSION / ASSESSMENT AND PLAN / ED COURSE  Pertinent labs & imaging results that were available during my care of the patient were reviewed by me and considered in my medical decision making (see chart for details).  68 year old female with PMH as noted above presents with acute onset of dysarthria, difficulty swallowing, and facial droop at 8:30 AM.  On arrival patient was screened and then immediately taken to CT.  In the room I was met by Dr. Creig Hines from neurology who evaluated the patient and recommended tPA.  The patient agreed with the plan.  Initially the blood pressure was significantly elevated but it improved with IV hydralazine.  Per neurology, the patient will require transfer to [facility] status post tPA administration.  ----------------------------------------- 11:06 AM on 11/16/2018 -----------------------------------------  Patient will be transferred to [facility] and plan is neuro intervention.  I  discussed the case with Dr. Malen Gauze who accepts the patient to his service.  [facility] is en route.  The patient is stable for transfer at this time.   ___________________________  Lisa Green was evaluated in Emergency Department on 11/16/2018 for the symptoms described in the history of present illness. She was evaluated in the context of the global COVID-19 pandemic, which necessitated consideration that the patient might be at risk for infection with the SARS-CoV-2 virus that causes COVID-19. Institutional protocols and algorithms that pertain to the evaluation of patients at risk for COVID-19 are in a state of rapid change based on information released by regulatory  bodies including the CDC and federal and state organizations. These policies and algorithms were followed during the patient's care in the ED.  ____________________________________________   FINAL CLINICAL IMPRESSION(S) / ED DIAGNOSES  Final diagnoses:  Cerebrovascular accident (CVA), unspecified mechanism ([place])      NEW MEDICATIONS STARTED DURING THIS VISIT:  New Prescriptions   No medications on file     Note:  This document was prepared using Dragon voice recognition software and may include unintentional dictation errors.    Arta Silence, MD 11/16/18 [phone]

## 2018-11-16 NOTE — ED Notes (Signed)
Marya Amsler RN called patient's spouse to give update, patient is now on given the phone to talk with her spouse.

## 2018-11-16 NOTE — ED Triage Notes (Signed)
Pt is here with c/o difficulty swallowing and left facial droop that started around 9am, states she woke up fine this morning working from home, pt has slurred speech.

## 2018-11-16 NOTE — Progress Notes (Signed)
CODE STROKE- PHARMACY COMMUNICATION   Time CODE STROKE called/page received:[id number]  Time response to CODE STROKE was made (in person or via phone): 1000  Time Stroke Kit retrieved from [place] (only if needed): 1000  Name of Provider/Nurse contacted:Olivia Broomer  Past Medical History:  Diagnosis Date  . Diverticulosis 2013   colonoscopy, [place]  . Hyperlipidemia   . Obesity (BMI 30.0-34.9)    Prior to Admission medications   Medication Sig Start Date End Date Taking? Authorizing Provider  rosuvastatin (CRESTOR) 10 MG tablet TAKE 1 TABLET (10 MG TOTAL) DAILY BY MOUTH. 07/05/18   Crecencio Mc, MD  telmisartan (MICARDIS) 20 MG tablet Take 1 tablet (20 mg total) by mouth daily. 05/31/18   Crecencio Mc, MD    Simpson,Michael L RPh Clinical Pharmacist  11/16/2018  11:05 AM

## 2018-11-16 NOTE — ED Notes (Signed)
Pt's husband, Haeleigh Streiff, would like a call with updates on his wife. His number is [phone].

## 2018-11-16 NOTE — Consult Note (Signed)
Chief Complaint: right facial droop/dysarthria  LKN: 8:30 am tPA Given: yes  68 y/o with history of HTN presents to ER with dysarthria and drooling. Patient LKN around 8:30 in am. While working [employer] started experiencing drooling and dysarthria. No LOC, no visual disturbances, no sz like activity Head ct with possible hyperdense MCA. Patient NIHS 4 (dysarthria, right facial drift on ruex, Luex) SBP was in 200's initially that necessitated hydralazine , briefly nicardipine drip. Once stable, TPA was given at 10:20 ( tpa delayed because BP control) CTA with questionable occlusion in M 3 Case discussed with Dr. Rory Percy who feels that  since patient is fully functional with questionable occlusion, she will be served by a diagnostic/therapeutic formal angio. I relayed my discussion with Dr. Rory Percy to the patient who agrees to the procedure. Pateitn transferred to [employer],  Past Medical History:  Diagnosis Date  . Diverticulosis 2013   colonoscopy, [place]  . Hyperlipidemia   . Obesity (BMI 30.0-34.9)     Past Surgical History:  Procedure Laterality Date  . ABDOMINAL HYSTERECTOMY     with wedge resection of right ovary , normal path  . [place]  . CESAREAN SECTION  1983  . COSMETIC SURGERY    . ENDOVENOUS ABLATION SAPHENOUS VEIN W/ LASER  2008   for varicose veins,  [place], [place]    Family History  Problem Relation Age of Onset  . Coronary artery disease Father 72  . Stroke Father   . Coronary artery disease Paternal Uncle   . Heart disease Paternal Uncle   . Coronary artery disease Paternal Grandmother   . Heart disease Paternal Grandmother   . Coronary artery disease Paternal Grandfather   . Heart disease Paternal Grandfather   . Heart disease Mother   . Cancer Sister 37       ovarian ca  . Heart disease Brother   . Breast cancer Neg Hx    Social History:  reports that she has never smoked. She has never used smokeless tobacco. She  reports current alcohol use. She reports that she does not use drugs.  Allergies:  Allergies  Allergen Reactions  . Wasp Venom Protein Anaphylaxis  . Insect Extract Allergy Skin Test     Insect Stings    (Not in a hospital admission)   ROS:   Physical Examination: Blood pressure (!) 155/92, pulse 68, temperature 98.4 F (36.9 C), resp. rate 18, height 5\' 6"  (1.676 m), weight 95.6 kg, SpO2 100 %.  HEENT-  Normocephalic, no lesions, without obvious abnormality.  Normal external eye and conjunctiva.  Normal TM's bilaterally.  Normal auditory canals and external ears. Normal external nose, mucus membranes and septum.  Normal pharynx. Neck supple with no masses, nodes, nodules or enlargement. Cardiovascular - regular rate and rhythm, S1, S2 normal, no murmur, click, rub or gallop Lungs - chest clear, no wheezing, rales, normal symmetric air entry, Heart exam - S1, S2 normal, no murmur, no gallop, rate regular Abdomen - soft, non-tender; bowel sounds normal; no masses,  no organomegaly Extremities - no edema  Neurologic Examination: Alert, awake, dysarthric, follows commands, oriented x3 CN: PERRLA, EOMI, VFF, no nystagmus, right facial, face sensation seems nle, tongue and palate midline Motor: drift on RUEX and LUEX No sensory deficit appreciated No coordination deficit appreciated\gait and DTR not checked at time of examination   Results for orders placed or performed during the hospital encounter of 11/16/18 (from the past 48  hour(s))  Glucose, capillary     Status: None   Collection Time: 11/16/18  9:47 AM  Result Value Ref Range   Glucose-Capillary 93 70 - 99 mg/dL  CBC     Status: None   Collection Time: 11/16/18 10:03 AM  Result Value Ref Range   WBC 6.0 4.0 - 10.5 K/uL   RBC 4.46 3.87 - 5.11 MIL/uL   Hemoglobin 14.0 12.0 - 15.0 g/dL   HCT 41.5 36.0 - 46.0 %   MCV 93.0 80.0 - 100.0 fL   MCH 31.4 26.0 - 34.0 pg   MCHC 33.7 30.0 - 36.0 g/dL   RDW 12.6 11.5 - 15.5 %    Platelets 235 150 - 400 K/uL   nRBC 0.0 0.0 - 0.2 %    Comment: Performed at [facility], [address] [place], [place] [address]  Differential     Status: None   Collection Time: 11/16/18 10:03 AM  Result Value Ref Range   Neutrophils Relative % 59 %   Neutro Abs 3.5 1.7 - 7.7 K/uL   Lymphocytes Relative 33 %   Lymphs Abs 2.0 0.7 - 4.0 K/uL   Monocytes Relative 6 %   Monocytes Absolute 0.4 0.1 - 1.0 K/uL   Eosinophils Relative 1 %   Eosinophils Absolute 0.1 0.0 - 0.5 K/uL   Basophils Relative 1 %   Basophils Absolute 0.0 0.0 - 0.1 K/uL   Immature Granulocytes 0 %   Abs Immature Granulocytes 0.02 0.00 - 0.07 K/uL    Comment: Performed at [facility], [address] [place], [place] [address]  Comprehensive metabolic panel     Status: Abnormal   Collection Time: 11/16/18 10:03 AM  Result Value Ref Range   Sodium 140 135 - 145 mmol/L   Potassium 4.0 3.5 - 5.1 mmol/L   Chloride 106 98 - 111 mmol/L   CO2 23 22 - 32 mmol/L   Glucose, Bld 112 (H) 70 - 99 mg/dL   BUN 13 8 - 23 mg/dL   Creatinine, Ser 0.69 0.44 - 1.00 mg/dL   Calcium 9.5 8.9 - 10.3 mg/dL   Total Protein 7.7 6.5 - 8.1 g/dL   Albumin 4.5 3.5 - 5.0 g/dL   AST 27 15 - 41 U/L   ALT 38 0 - 44 U/L   Alkaline Phosphatase 79 38 - 126 U/L   Total Bilirubin 0.8 0.3 - 1.2 mg/dL   GFR calc non Af Amer >60 >60 mL/min   GFR calc Af Amer >60 >60 mL/min   Anion gap 11 5 - 15    Comment: Performed at [facility], [address], [place], [place] [address]   Ct Angio Head W Or Wo Contrast  Result Date: 11/16/2018 CLINICAL DATA:  Code stroke. Slurred speech. Patient received tPA in emergency room EXAM: CT ANGIOGRAPHY HEAD AND NECK TECHNIQUE: Multidetector CT imaging of the head and neck was performed using the standard protocol during bolus administration of intravenous contrast. Multiplanar CT image reconstructions and MIPs were obtained to evaluate the vascular anatomy. Carotid stenosis  measurements (when applicable) are obtained utilizing NASCET criteria, using the distal internal carotid diameter as the denominator. CONTRAST:  77mL OMNIPAQUE IOHEXOL 350 MG/ML SOLN COMPARISON:  CT head 11/16/2018 FINDINGS: CTA NECK FINDINGS Aortic arch: Standard branching. Imaged portion shows no evidence of aneurysm or dissection. No significant stenosis of the major arch vessel origins. Right carotid system: Right carotid system widely patent without stenosis or atherosclerotic disease Left carotid system:  Left carotid system widely patent without stenosis or atherosclerotic disease. Vertebral arteries: Both vertebral arteries widely patent to the basilar without stenosis. Skeleton: No acute abnormality Other neck: Negative for mass or adenopathy in the neck. Upper chest: Lung apices clear bilaterally. Review of the MIP images confirms the above findings CTA HEAD FINDINGS Anterior circulation: Left cavernous carotid widely patent. Left anterior and middle cerebral arteries widely patent. Previous CT suggested possible hyperdense left internal carotid artery and left MCA. Interval tPA. Vessels widely patent currently. No branch occlusion. Right cavernous carotid widely patent. Right anterior and middle cerebral arteries widely patent bilaterally. Posterior circulation: Both vertebral arteries patent to the basilar. PICA patent bilaterally. Basilar widely patent. Superior cerebellar and posterior cerebral arteries widely patent bilaterally. Venous sinuses: Patent Anatomic variants: None Delayed phase: Not perform Review of the MIP images confirms the above findings IMPRESSION: 1. Normal CTA head neck. No large vessel occlusion or stenosis. Possible hyperdense left MCA on prior CT may have recanalized following tPA. No branch occlusion 2. No carotid or vertebral artery stenosis in the neck 3. These results were called by telephone at the time of interpretation on 11/16/2018 at 10:59 am to Dr. Neville Route , who  verbally acknowledged these results. Electronically Signed   By: Franchot Gallo M.D.   On: 11/16/2018 11:01   Ct Angio Neck W And/or Wo Contrast  Result Date: 11/16/2018 CLINICAL DATA:  Code stroke. Slurred speech. Patient received tPA in emergency room EXAM: CT ANGIOGRAPHY HEAD AND NECK TECHNIQUE: Multidetector CT imaging of the head and neck was performed using the standard protocol during bolus administration of intravenous contrast. Multiplanar CT image reconstructions and MIPs were obtained to evaluate the vascular anatomy. Carotid stenosis measurements (when applicable) are obtained utilizing NASCET criteria, using the distal internal carotid diameter as the denominator. CONTRAST:  43mL OMNIPAQUE IOHEXOL 350 MG/ML SOLN COMPARISON:  CT head 11/16/2018 FINDINGS: CTA NECK FINDINGS Aortic arch: Standard branching. Imaged portion shows no evidence of aneurysm or dissection. No significant stenosis of the major arch vessel origins. Right carotid system: Right carotid system widely patent without stenosis or atherosclerotic disease Left carotid system: Left carotid system widely patent without stenosis or atherosclerotic disease. Vertebral arteries: Both vertebral arteries widely patent to the basilar without stenosis. Skeleton: No acute abnormality Other neck: Negative for mass or adenopathy in the neck. Upper chest: Lung apices clear bilaterally. Review of the MIP images confirms the above findings CTA HEAD FINDINGS Anterior circulation: Left cavernous carotid widely patent. Left anterior and middle cerebral arteries widely patent. Previous CT suggested possible hyperdense left internal carotid artery and left MCA. Interval tPA. Vessels widely patent currently. No branch occlusion. Right cavernous carotid widely patent. Right anterior and middle cerebral arteries widely patent bilaterally. Posterior circulation: Both vertebral arteries patent to the basilar. PICA patent bilaterally. Basilar widely patent.  Superior cerebellar and posterior cerebral arteries widely patent bilaterally. Venous sinuses: Patent Anatomic variants: None Delayed phase: Not perform Review of the MIP images confirms the above findings IMPRESSION: 1. Normal CTA head neck. No large vessel occlusion or stenosis. Possible hyperdense left MCA on prior CT may have recanalized following tPA. No branch occlusion 2. No carotid or vertebral artery stenosis in the neck 3. These results were called by telephone at the time of interpretation on 11/16/2018 at 10:59 am to Dr. Neville Route , who verbally acknowledged these results. Electronically Signed   By: Franchot Gallo M.D.   On: 11/16/2018 11:01   Ct Head Code Stroke Wo Contrast  Result Date: 11/16/2018 CLINICAL DATA:  Code stroke. Slurred speech. Right facial droop 9 a.m. today. EXAM: CT HEAD WITHOUT CONTRAST TECHNIQUE: Contiguous axial images were obtained from the base of the skull through the vertex without intravenous contrast. COMPARISON:  None. FINDINGS: Brain: Ventricle size normal. Negative for acute hemorrhage. Scattered mild white matter changes most likely due to ischemia indeterminate age. No acute cortical infarction. Negative for mass or edema. Vascular: Possible hyperdense left supraclinoid internal carotid artery and proximal left middle cerebral artery. Skull: Negative Sinuses/Orbits: Negative Other: None ASPECTS ([place] Stroke Program Early CT Score) - Ganglionic level infarction (caudate, lentiform nuclei, internal capsule, insula, M1-M3 cortex): 7 - Supraganglionic infarction (M4-M6 cortex): 3 Total score (0-10 with 10 being normal): 10 IMPRESSION: 1. Possible hyperdense left supraclinoid internal carotid artery and left MCA suspicious for acute thrombosis. CTA head and neck and CT perfusion brain recommended. 2. No acute ischemic infarct.  No intracranial hemorrhage 3. ASPECTS is 10 4. These results were called by telephone at the time of interpretation on 11/16/2018 at 10:08  am to Dr. Arta Silence , who verbally acknowledged these results. Electronically Signed   By: Franchot Gallo M.D.   On: 11/16/2018 10:09    Assessment: 68 y.o. female  With dysarthria and right facial droop. S/p Tap administration with questionable m3 occlusion. Patient being transferred to [facility] for diagnostc/therapeuthic andio    Naithen Rivenburg. MD Neurology/neurocrtical care  11/16/2018, 11:21 AM

## 2018-11-16 NOTE — Code Documentation (Signed)
Py arrives via POV with complaints of sudden onset of facial droop and slurred speech with difficulty swallowing at 0830, code stroke activated upon arrival to the ED, pt cleared for CT by EDP, after CT pt taken to room 25, Dr. Creig Hines at bedside, Dr. Charlynn Court at bedside, initial NIHSS 4 with left arm drift, facial droop, and dysarthria. tPA started at 1020, pt taken back to CT for CTA, pt transferred to [facility] for possible IR, report off to [facility]

## 2018-11-16 NOTE — Plan of Care (Signed)
Progressing

## 2018-11-16 NOTE — ED Notes (Addendum)
Patient arrived from [facility] via [facility], patient is alert and oriented X 4, slurred speech, left face droop.  She is reported to have a sudden left face droop and difficulty swallowing this morning about 0830. She received TPA at [facility] prior to transporting to [facility].

## 2018-11-16 NOTE — ED Notes (Signed)
Called patient's husband to update on status and transfer to 3W*30. He was given the unit's phone number if he has any question. Patient's cell phone was fully charged and given back to her prior to transport to the [facility]

## 2018-11-16 NOTE — Progress Notes (Signed)
Patient arrived to unit 3w-30 at 1315

## 2018-11-16 NOTE — H&P (Addendum)
Stroke neurology H&P  CC: Dysarthria and facial droop on the left  History is obtained from: Patient, chart  HPI: Lisa Green is a 68 y.o. female past medical history of hyperlipidemia, obesity, who was in her usual state of health at around 9 AM.  She woke up normal this morning and had a sudden onset of left facial droop and slurred speech around 9 AM. Presented at [facility], evaluated by in-house neurologist there and given IV TPA for stroke. Concern for possible LVO based on exam and or imaging studies by [facility] [place] provider and transferred to [facility] after giving TPA for possible intervention. On my examination, exam consistent with a small vessel stroke.  Imaging reviewed with neuroradiology and interventional neuroradiology and no identifiable occlusion to intervene on. Admitted to ICU for further work-up.  Per report- NIH 4 (dysarthria, right facial droop and drift on right upper and left upper extremity)-appears inconsistent because patient actually has a left facial droop on exam. LKW: 9 AM tpa given?:  At  regional Premorbid modified Rankin scale (mRS): 0   ROS: Review of systems obtained and is negative except in HPI  Past Medical History:  Diagnosis Date  . Diverticulosis 2013   colonoscopy, [place]  . Hyperlipidemia   . Obesity (BMI 30.0-34.9)     Family History  Problem Relation Age of Onset  . Coronary artery disease Father 7  . Stroke Father   . Coronary artery disease Paternal Uncle   . Heart disease Paternal Uncle   . Coronary artery disease Paternal Grandmother   . Heart disease Paternal Grandmother   . Coronary artery disease Paternal Grandfather   . Heart disease Paternal Grandfather   . Heart disease Mother   . Cancer Sister 72       ovarian ca  . Heart disease Brother   . Breast cancer Neg Hx    Social History:   reports that she has never smoked. She has never used smokeless tobacco. She reports current alcohol use.  She reports that she does not use drugs.   Medications  Current Facility-Administered Medications:  .   stroke: mapping our early stages of recovery book, , Does not apply, Once, Amie Portland, MD .  0.9 %  sodium chloride infusion, , Intravenous, Continuous, Amie Portland, MD .  acetaminophen (TYLENOL) tablet 650 mg, 650 mg, Oral, Q4H PRN **OR** acetaminophen (TYLENOL) solution 650 mg, 650 mg, Per Tube, Q4H PRN **OR** acetaminophen (TYLENOL) suppository 650 mg, 650 mg, Rectal, Q4H PRN, Amie Portland, MD .  pantoprazole (PROTONIX) injection 40 mg, 40 mg, Intravenous, QHS, Amie Portland, MD .  senna-docusate (Senokot-S) tablet 1 tablet, 1 tablet, Oral, QHS PRN, Amie Portland, MD  Current Outpatient Medications:  .  rosuvastatin (CRESTOR) 10 MG tablet, TAKE 1 TABLET (10 MG TOTAL) DAILY BY MOUTH., Disp: 90 tablet, Rfl: 1 .  telmisartan (MICARDIS) 20 MG tablet, Take 1 tablet (20 mg total) by mouth daily., Disp: 30 tablet, Rfl: 5  Exam: Current vital signs: BP (!) 160/100 (BP Location: Right Arm)   Pulse 85   Temp 98.9 F (37.2 C) (Oral)   Resp 17   SpO2 99%  Vital signs in last 24 hours: Temp:  [97.8 F (36.6 C)-98.9 F (37.2 C)] 98.9 F (37.2 C) (06/17 1217) Pulse Rate:  [68-88] 85 (06/17 1217) Resp:  [14-27] 17 (06/17 1217) BP: (152-188)/(75-110) 160/100 (06/17 1217) SpO2:  [98 %-100 %] 99 % (06/17 1228) Weight:  [95.6 kg] 95.6 kg (06/17 0959) GENERAL:  Awake, alert in NAD HEENT: - Normocephalic and atraumatic, dry mm, no LN++, no Thyromegally LUNGS - Clear to auscultation bilaterally with no wheezes CV - S1S2 RRR, no m/r/g, equal pulses bilaterally. ABDOMEN - Soft, nontender, nondistended with normoactive BS Ext: warm, well perfused, intact peripheral pulses, no edema  NEURO:  Mental Status: AA&Ox3  Language: speech is dysarthric.  Naming, repetition, fluency, and comprehension intact. Cranial Nerves: PERRL EOMI, visual fields full, left lower facial weakness , facial  sensation intact, hearing intact, tongue/uvula/soft palate midline, normal sternocleidomastoid and trapezius muscle strength. No evidence of tongue atrophy or fibrillations Motor: 5/5 in all 4 extremities with no vertical drift Tone: is normal and bulk is normal Sensation- Intact to light touch bilaterally Coordination: FTN intact bilaterall Gait- deferred  NIHSS-2   Labs I have reviewed labs in epic and the results pertinent to this consultation are:  CBC    Component Value Date/Time   WBC 6.0 11/16/2018 1003   RBC 4.46 11/16/2018 1003   HGB 14.0 11/16/2018 1003   HCT 41.5 11/16/2018 1003   PLT 235 11/16/2018 1003   MCV 93.0 11/16/2018 1003   MCH 31.4 11/16/2018 1003   MCHC 33.7 11/16/2018 1003   RDW 12.6 11/16/2018 1003   LYMPHSABS 2.0 11/16/2018 1003   MONOABS 0.4 11/16/2018 1003   EOSABS 0.1 11/16/2018 1003   BASOSABS 0.0 11/16/2018 1003    CMP     Component Value Date/Time   NA 140 11/16/2018 1003   NA 142 03/30/2017   K 4.0 11/16/2018 1003   CL 106 11/16/2018 1003   CO2 23 11/16/2018 1003   GLUCOSE 112 (H) 11/16/2018 1003   BUN 13 11/16/2018 1003   BUN 15 03/30/2017   CREATININE 0.69 11/16/2018 1003   CALCIUM 9.5 11/16/2018 1003   PROT 7.7 11/16/2018 1003   ALBUMIN 4.5 11/16/2018 1003   AST 27 11/16/2018 1003   ALT 38 11/16/2018 1003   ALKPHOS 79 11/16/2018 1003   BILITOT 0.8 11/16/2018 1003   GFRNONAA >60 11/16/2018 1003   GFRAA >60 11/16/2018 1003    Imaging I have reviewed the images obtained:  CT-scan of the brain shows no acute changes but possible hyperdense left MCA. CTA of the head with no acute change or emergent LVO  Assessment: 68 year old woman with sudden onset of left facial weakness and dysarthria along with possible bilateral upper extremity weakness that has been listed on the exam from the outside hospital given IV TPA for concern for acute ischemic stroke. On my examination, her examination is consistent with a possible small  vessel etiology stroke because she only has left facial droop and dysarthria. Status post TPA at the outside hospital She was given nicardipine on the way to the hospital here from [employer] regional to control her pressures under 180  Although on report it was presented to me as a possible large vessel occlusion stroke, she has no signs of large vessel occlusion on exam or on the CTA of the head and neck.  No intervention needed-Case discussed with neuroradiology and interventional neuroradiology.  Impression: Acute ischemic stroke-likely small vessel  Recommendations: Admit to 3 W.- neuro progressive unit No antiplatelets or anticoagulants for 24 hours. Systolic blood pressure below 185.  Can discontinue nicardipine and use labetalol hydralazine as needed. MRI of the brain without contrast Frequent neurochecks per TPA protocol Gentle hydration 75 cc normal saline. Check A1c, LDL, 2D echocardiogram as risk factor work-up for stroke. PT OT Speech therapy N.p.o. until cleared by bedside  swallow or formal swallow evaluation Stroke team primary.  -- Amie Portland, MD [facility] Pager: [phone] If 7pm to 7am, please call on call as listed on AMION.   -- Amie Portland, MD [facility] Pager: [phone] If 7pm to 7am, please call on call as listed on AMION.  CRITICAL CARE ATTESTATION Performed by: Amie Portland, MD Total critical care time: 50 minutes Critical care time was exclusive of separately billable procedures and treating other patients and/or supervising APPs/Residents/Students Critical care was necessary to treat or prevent imminent or life-threatening deterioration due to acute ischemic stroke This patient is critically ill and at significant risk for neurological worsening and/or death and care requires constant monitoring. Critical care was time spent personally by me on the following activities: development of treatment plan with patient  and/or surrogate as well as nursing, discussions with consultants, evaluation of patient's response to treatment, examination of patient, obtaining history from patient or surrogate, ordering and performing treatments and interventions, ordering and review of laboratory studies, ordering and review of radiographic studies, pulse oximetry, re-evaluation of patient's condition, participation in multidisciplinary rounds and medical decision making of high complexity in the care of this patient.

## 2018-11-16 NOTE — ED Notes (Signed)
[facility]  Called  For  transfer

## 2018-11-16 NOTE — Code Documentation (Signed)
Evaluated patient after she transferred to [facility]. Pt is stable. Alert & Oriented x4. Upon assessment, pt continues to have left facial droop, dysarthria, and a very slight pronator drift in the left arm. No other focal deficits noted.

## 2018-11-16 NOTE — Evaluation (Signed)
Speech Language Pathology Evaluation Patient Details Name: Lisa Green MRN: [id number] DOB: 30-Mar-1951 Today's Date: 11/16/2018 Time: 4193-7902 SLP Time Calculation (min) (ACUTE ONLY): 24 min  Problem List:  Patient Active Problem List   Diagnosis Date Noted  . Acute ischemic stroke ([place]) 11/16/2018  . Hypertension 05/31/2018  . Thumb pain, right 05/31/2018  . Parotid mass 06/16/2015  . S/P abdominal hysterectomy and right salpingo-oophorectomy 06/14/2015  . Obesity (BMI 30.0-34.9)   . Visit for preventive health examination 03/06/2011  . Screening for breast cancer 02/03/2011  . Screening for colon cancer 02/03/2011  . Hepatic steatosis 02/03/2011  . Hyperlipidemia with target LDL less than 100 02/03/2011   Past Medical History:  Past Medical History:  Diagnosis Date  . Diverticulosis 2013   colonoscopy, [place]  . Hyperlipidemia   . Obesity (BMI 30.0-34.9)    Past Surgical History:  Past Surgical History:  Procedure Laterality Date  . ABDOMINAL HYSTERECTOMY     with wedge resection of right ovary , normal path  . [place]  . CESAREAN SECTION  1983  . COSMETIC SURGERY    . ENDOVENOUS ABLATION SAPHENOUS VEIN W/ LASER  2008   for varicose veins,  [place], [place]   HPI:  Pt is a 68 y/o female with history of HTN who presented to the ED with dysarthria and drooling. No LOC, no visual disturbances, no seizure-like activity. TPA was administered. CT of the head showed possible hyperdense left supraclinoid internal carotid artery and left MCA suspicious for acute thrombosis. No acute ischemic infarct.    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 She denied any baseline deficits in speech, language, or cognition or any new deficits in language or cognition but described her speech as "slurred" and stated that is is currently  50% back to baseline. The [facility] Cognitive Assessment 8.1 was completed to evaluate the pt's cognitive-linguistic skills. She achieved a score of 27/30 which is within the normal limits of 26 or more out of 30 and no speech/language deficits were demonstrated. She presents with moderate dysarthria characterized by reduced articulatory precision secondary to oral motor weakness which negatively impacted speech intelligibility at the sentence and conversational levels. Continued SLP services are clinically indicated at this time to improve dysarthria. Pt and nursing were educated regarding results and recommendations; both parties verbalized understanding as well as agreement with plan of care.    SLP Assessment  SLP Recommendation/Assessment: Patient needs continued Speech Lanaguage Pathology Services SLP Visit Diagnosis: Dysarthria and anarthria (R47.1)    Follow Up Recommendations  Other (comment)(Continued SLP services at level of care recommended by PT/OT)    Frequency and Duration min 2x/week  2 weeks      SLP Evaluation Cognition  Overall Cognitive Status: Within Functional Limits for tasks assessed Arousal/Alertness: Awake/alert Orientation Level: Oriented X4 Attention: Focused;Sustained Focused Attention: Appears intact(Vigilance WNL: 1/1) Sustained Attention: Appears intact(Serial 7s: 3/3) Memory: Appears intact(Immediate: 5/5; Delayed: 4/5; with cues: 1/1) Awareness: Appears intact Problem Solving: Appears intact Executive Function: Reasoning;Sequencing;Organizing Reasoning: Appears intact(Abstraction: 2/2) Sequencing: Appears intact(Clock drawing: 1/3) Organizing: Appears intact(Backward digit span: 1/1)       Comprehension  Auditory Comprehension Overall Auditory Comprehension: Appears within functional limits for tasks assessed Yes/No Questions: Within Functional Limits Commands: Within Functional Limits Complex Commands: (Trail completion: 1/1) Conversation:  Complex Visual Recognition/Discrimination Discrimination: Within Function  Limits Reading Comprehension Reading Status: Within funtional limits    Expression Expression Primary Mode of Expression: Verbal Verbal Expression Overall Verbal Expression: Appears within functional limits for tasks assessed Initiation: No impairment Level of Generative/Spontaneous Verbalization: Conversation Repetition: No impairment(Sentenc: 2/2) Naming: No impairment Confrontation: Within functional limits(3/3) Divergent: (1/1) Pragmatics: No impairment Written Expression Dominant Hand: Left Written Expression: (Copying cube: 1/1)   Oral / Motor  Oral Motor/Sensory Function Overall Oral Motor/Sensory Function: Moderate impairment Facial ROM: Reduced left;Suspected CN VII (facial) dysfunction Facial Symmetry: Abnormal symmetry left;Suspected CN VII (facial) dysfunction Facial Strength: Reduced left;Suspected CN VII (facial) dysfunction Facial Sensation: Within Functional Limits Lingual ROM: Reduced left;Suspected CN XII (hypoglossal) dysfunction Lingual Symmetry: Abnormal symmetry left;Suspected CN XII (hypoglossal) dysfunction Lingual Strength: Reduced;Suspected CN XII (hypoglossal) dysfunction Lingual Sensation: Within Functional Limits Velum: Within Functional Limits Motor Speech Overall Motor Speech: Impaired(50% back to baseline ) Respiration: Impaired Level of Impairment: Conversation Phonation: Normal Resonance: Within functional limits Articulation: Impaired Level of Impairment: Sentence Intelligibility: Intelligibility reduced Word: 75-100% accurate Phrase: 75-100% accurate Sentence: 50-74% accurate Conversation: 50-74% accurate Motor Planning: Witnin functional limits Motor Speech Errors: Aware;Consistent   Christoher Drudge I. Hardin Negus, [place], [place] Office number [phone] Pager [place] 11/16/2018, 3:22  PM

## 2018-11-16 NOTE — Code Documentation (Signed)
Code Stroke initiated by [facility] and transferred to [facility] ED for potential IR treatment. Patient arrived at 1211 while this RN was with another patient. Neurology evaluated patient and noted that patient's NIHSS 2 and patient no longer candidate for IR. Pt admitted to Neurology service and going to be transferred to [facility] [place]. Post-tPA assessments continued and completed by ED RN.

## 2018-11-16 NOTE — ED Notes (Signed)
Nurse navigator spoke with patient and then called husband who is in the parking lot. Neurologist spoke with husband and given plan of care.  Nurse spoke with husband and then had husband speak with patient on the phone.

## 2018-11-16 NOTE — ED Notes (Signed)
EMTALA reviewed. 

## 2018-11-16 NOTE — Progress Notes (Signed)
  Speech Language Pathology Treatment: Cognitive-Linquistic  Patient Details Name: Lisa Green MRN: [id number] DOB: 1950-09-10 Today's Date: 11/16/2018 Time: 8563-1497 SLP Time Calculation (min) (ACUTE ONLY): 22 min  Assessment / Plan / Recommendation Clinical Impression  Pt was seen for dysarthria treatment and was cooperative during the session. She was educated regarding the nature of dysarthria, compensatory strategies to improve speech intelligibility, and the purpose of exercises. A dysarthria handout was provided to facilitate education and the pt verbalized understanding regarding all areas of education. She completed labial and lingual ROM and strengthening exercises with mod. cues. She used compensatory strategies at the phrase level with 90% accuracy increasing to 100% accuracy with min. cues for overarticulation. At the 5-7 word sentence level she demonstrated 75% accuracy increasing to 100% with min-mod cues to overarticulate and reduce speaking rate. She required moderate cues to use compensatory strategies during structured spontaneous speech.  SLP will continue to follow pt.    HPI HPI: Pt is a 68 y/o female with history of HTN who presented to the ED with dysarthria and drooling. No LOC, no visual disturbances, no seizure-like activity. TPA was administered. CT of the head showed possible hyperdense left supraclinoid internal carotid artery and left MCA suspicious for acute thrombosis. No acute ischemic infarct.       SLP Plan  Continue with current plan of care  Patient needs continued Speech Lanaguage Pathology Services    Recommendations                   Follow up Recommendations: Other (comment)(Continued SLP services at level of care recommended by PT/OT) SLP Visit Diagnosis: Dysarthria and anarthria (R47.1) Plan: Continue with current plan of care       Lisa Green, [place], [place] Office number [phone] Pager  [place] 11/16/2018, 3:27 PM

## 2018-11-16 NOTE — ED Notes (Signed)
Code  Stroke  Called to [phone]

## 2018-11-17 ENCOUNTER — Inpatient Hospital Stay (HOSPITAL_COMMUNITY): Payer: 59

## 2018-11-17 DIAGNOSIS — I63411 Cerebral infarction due to embolism of right middle cerebral artery: Principal | ICD-10-CM

## 2018-11-17 DIAGNOSIS — Z86718 Personal history of other venous thrombosis and embolism: Secondary | ICD-10-CM

## 2018-11-17 DIAGNOSIS — I639 Cerebral infarction, unspecified: Secondary | ICD-10-CM

## 2018-11-17 DIAGNOSIS — I10 Essential (primary) hypertension: Secondary | ICD-10-CM

## 2018-11-17 DIAGNOSIS — E782 Mixed hyperlipidemia: Secondary | ICD-10-CM

## 2018-11-17 DIAGNOSIS — E669 Obesity, unspecified: Secondary | ICD-10-CM

## 2018-11-17 HISTORY — DX: Cerebral infarction, unspecified: I63.9

## 2018-11-17 LAB — BASIC METABOLIC PANEL
Anion gap: 10 (ref 5–15)
BUN: 8 mg/dL (ref 8–23)
CO2: 24 mmol/L (ref 22–32)
Calcium: 9.3 mg/dL (ref 8.9–10.3)
Chloride: 107 mmol/L (ref 98–111)
Creatinine, Ser: 0.65 mg/dL (ref 0.44–1.00)
GFR calc Af Amer: >60 mL/min (ref >60)
GFR calc non Af Amer: >60 mL/min (ref >60)
Glucose, Bld: 98 mg/dL (ref 70–99)
Potassium: 3.3 mmol/L — ABNORMAL LOW (ref 3.5–5.1)
Sodium: 141 mmol/L (ref 135–145)

## 2018-11-17 LAB — ECHOCARDIOGRAM COMPLETE
Height: 66 in
Weight: 3372.16 oz

## 2018-11-17 LAB — CBC
HCT: 40.9 % (ref 36.0–46.0)
Hemoglobin: 13.8 g/dL (ref 12.0–15.0)
MCH: 31.6 pg (ref 26.0–34.0)
MCHC: 33.7 g/dL (ref 30.0–36.0)
MCV: 93.6 fL (ref 80.0–100.0)
Platelets: 229 10*3/uL (ref 150–400)
RBC: 4.37 MIL/uL (ref 3.87–5.11)
RDW: 12.8 % (ref 11.5–15.5)
WBC: 7.4 10*3/uL (ref 4.0–10.5)
nRBC: 0 % (ref 0.0–0.2)

## 2018-11-17 LAB — HEMOGLOBIN A1C
Hgb A1c MFr Bld: 5.4 % (ref 4.8–5.6)
Mean Plasma Glucose: 108.28 mg/dL

## 2018-11-17 LAB — LIPID PANEL
Cholesterol: 219 mg/dL — ABNORMAL HIGH (ref 0–200)
HDL: 40 mg/dL — ABNORMAL LOW (ref >40)
LDL Cholesterol: 132 mg/dL — ABNORMAL HIGH (ref 0–99)
Total CHOL/HDL Ratio: 5.5 RATIO
Triglycerides: 234 mg/dL — ABNORMAL HIGH (ref <150)
VLDL: 47 mg/dL — ABNORMAL HIGH (ref 0–40)

## 2018-11-17 LAB — HIV ANTIBODY (ROUTINE TESTING W REFLEX): HIV Screen 4th Generation wRfx: NONREACTIVE

## 2018-11-17 MED ORDER — ASPIRIN EC 325 MG PO TBEC
325.0000 mg | DELAYED_RELEASE_TABLET | Freq: Every day | ORAL | Status: DC
  Administered 2018-11-17: 325 mg via ORAL
  Filled 2018-11-17: qty 1

## 2018-11-17 MED ORDER — ROSUVASTATIN CALCIUM 20 MG PO TABS
20.0000 mg | ORAL_TABLET | Freq: Every day | ORAL | Status: DC
  Administered 2018-11-17 – 2018-11-18 (×2): 20 mg via ORAL
  Filled 2018-11-17 (×2): qty 1

## 2018-11-17 MED ORDER — POTASSIUM CHLORIDE CRYS ER 20 MEQ PO TBCR
40.0000 meq | EXTENDED_RELEASE_TABLET | ORAL | Status: AC
  Administered 2018-11-17 (×2): 40 meq via ORAL
  Filled 2018-11-17 (×2): qty 2

## 2018-11-17 NOTE — Evaluation (Signed)
Occupational Therapy Evaluation Patient Details Name: Lisa Green MRN: [id number] DOB: [birth year] Today's Date: 11/17/2018    History of Present Illness Pt is a 68 yo female s/p CVA with L facial droop, dysarthria and L pronator drift in LUE.   Clinical Impression    Pt PTA: independent and working. Pt currently performing ADL functional mobility, transfers and ADL standing at sink and when performing toilet hygiene with modified independence and fair balance. Pt with no focal deficits noted affecting physical status. Pt with L sided facial droop and slurred speech. Pt's vision, sensation and strength, WFL. Pt does not require continued OT at this time. OT signing off.    Follow Up Recommendations  No OT follow up    Equipment Recommendations  None recommended by OT    Recommendations for Other Services       Precautions / Restrictions Precautions Precautions: Fall Restrictions Weight Bearing Restrictions: No      Mobility Bed Mobility Overal bed mobility: Modified Independent             General bed mobility comments: supervisionA  Transfers Overall transfer level: Modified independent               General transfer comment: no physical assist    Balance Overall balance assessment: Modified Independent                                         ADL either performed or assessed with clinical judgement   ADL Overall ADL's : At baseline                                       General ADL Comments: Pt at functional baseline. only deficits are facial droop and slurred speech.     Vision Baseline Vision/History: No visual deficits Vision Assessment?: No apparent visual deficits     Perception     Praxis      Pertinent Vitals/Pain Pain Assessment: No/denies pain     Hand Dominance Left   Extremity/Trunk Assessment Upper Extremity Assessment Upper Extremity Assessment: Overall WFL for tasks assessed   Lower  Extremity Assessment Lower Extremity Assessment: Overall WFL for tasks assessed   Cervical / Trunk Assessment Cervical / Trunk Assessment: Normal   Communication Communication Communication: Other (comment)(slurred speech)   Cognition Arousal/Alertness: Awake/alert Behavior During Therapy: WFL for tasks assessed/performed Overall Cognitive Status: Within Functional Limits for tasks assessed                                     General Comments  Pt with no focal deficits- visual scanning and peripheral vision [facility]    Exercises     Shoulder Instructions      Home Living Family/patient expects to be discharged to:: Private residence Living Arrangements: Spouse/significant other Available Help at Discharge: Family;Available 24 hours/day Type of Home: House Home Access: Stairs to enter [employer] of Steps: 3 Entrance Stairs-Rails: Can reach both Home Layout: One level     Bathroom Shower/Tub: Occupational psychologist: Standard     Home Equipment: None      Lives With: Spouse    Prior Functioning/Environment Level of Independence: Independent  OT Problem List:        OT Treatment/Interventions:      OT Goals(Current goals can be found in the care plan section)    OT Frequency:     Barriers to D/C:            Co-evaluation              AM-PAC OT "6 Clicks" Daily Activity     Outcome Measure Help from another person eating meals?: None Help from another person taking care of personal grooming?: None Help from another person toileting, which includes using toliet, bedpan, or urinal?: None Help from another person bathing (including washing, rinsing, drying)?: None Help from another person to put on and taking off regular upper body clothing?: None Help from another person to put on and taking off regular lower body clothing?: None 6 Click Score: 24   End of Session Equipment Utilized During  Treatment: Gait belt Nurse Communication: Mobility status  Activity Tolerance: Patient tolerated treatment well Patient left: in bed;with call bell/phone within reach;Other (comment)(pt was up in chair, but required ECHO so returned to bed)  OT Visit Diagnosis: Unsteadiness on feet (R26.81);Other symptoms and signs involving the nervous system (R29.898)                Time: 1240-1320 OT Time Calculation (min): 40 min Charges:  OT General Charges $OT Visit: 1 Visit OT Evaluation $OT Eval Moderate Complexity: 1 Mod OT Treatments $Neuromuscular Re-education: 8-22 mins  Ebony Hail Harold Hedge) Marsa Aris OTR/L Acute Rehabilitation Services Pager: [phone] Office: [place] 11/17/2018, 2:05 PM

## 2018-11-17 NOTE — Progress Notes (Signed)
Patient is scheduled for TEE tomorrow, 11/18/18 at 1:30pm, with Dr. Debara Pickett. NPO at [place]. She has not consented to the procedure yet. Will speak with Dr. Debara Pickett in the morning.   Tami Lin Duke, PA-C 11/17/2018, 5:06 PM

## 2018-11-17 NOTE — Progress Notes (Signed)
Physical Therapy Evaluation Patient Details Name: Lisa Green MRN: [id number] DOB: 10/11/1950 Today's Date: 11/17/2018   History of Present Illness  Pt is a 68 yo female s/p CVA with L facial droop, dysarthria and L pronator drift in LUE.Marland Kitchen PMH diverticulitis, hyperlipidemia  Clinical Impression  Patient has no balance or strength deficits. She was able to ambulate with no syncope. Her bilateral leg strength was 5/5. Patient did not require an assistive device. She has no need for skilled therapy. The patients B/P did go up during ambulation. Nursing notified.     Follow Up Recommendations No PT follow up    Equipment Recommendations  None recommended by PT    Recommendations for Other Services       Precautions / Restrictions Precautions Precautions: Fall Restrictions Weight Bearing Restrictions: No      Mobility  Bed Mobility Overal bed mobility: Modified Independent             General bed mobility comments: able to get in and out of bed indepdnently   Transfers Overall transfer level: Modified independent               General transfer comment: no assist required   Ambulation/Gait Ambulation/Gait assistance: Independent Gait Distance (Feet): 150 Feet     Gait velocity: decreased   General Gait Details: pre gait B/P 141/91 post 183/101. Nursing notified. no LOB with agit. Patient able to change speeds and turn head without loosing balance  Stairs            Wheelchair Mobility    Modified Rankin (Stroke Patients Only) Modified Rankin (Stroke Patients Only) Pre-Morbid Rankin Score: No symptoms Modified Rankin: No significant disability     Balance Overall balance assessment: Modified Independent                             High Level Balance Comments: tandem stance independent; narrow base eyes open and eyes closed no assistance              Pertinent Vitals/Pain Pain Assessment: No/denies pain    Home Living  Family/patient expects to be discharged to:: Private residence Living Arrangements: Spouse/significant other Available Help at Discharge: Family;Available 24 hours/day Type of Home: House Home Access: Stairs to enter Entrance Stairs-Rails: Can reach both Entrance Stairs-Number of Steps: 3 Home Layout: One level Home Equipment: None      Prior Function Level of Independence: Independent         Comments: walked 2 miles a day      Hand Dominance   Dominant Hand: Left    Extremity/Trunk Assessment   Upper Extremity Assessment Upper Extremity Assessment: Defer to OT evaluation    Lower Extremity Assessment Lower Extremity Assessment: Overall WFL for tasks assessed    Cervical / Trunk Assessment Cervical / Trunk Assessment: Normal  Communication   Communication: Other (comment)  Cognition Arousal/Alertness: Awake/alert Behavior During Therapy: WFL for tasks assessed/performed Overall Cognitive Status: Within Functional Limits for tasks assessed                                        General Comments General comments (skin integrity, edema, etc.): Pt with no focal deficits- visual scanning and peripheral vision [facility]    Exercises     Assessment/Plan    PT Assessment Patent does not need any further PT  services  PT Problem List         PT Treatment Interventions      PT Goals (Current goals can be found in the Care Plan section)  Acute Rehab PT Goals Patient Stated Goal: to go home  PT Goal Formulation: With patient Time For Goal Achievement: 11/24/18 Potential to Achieve Goals: Good    Frequency     Barriers to discharge        Co-evaluation               AM-PAC PT "6 Clicks" Mobility  Outcome Measure Help needed turning from your back to your side while in a flat bed without using bedrails?: None Help needed moving from lying on your back to sitting on the side of a flat bed without using bedrails?: None Help needed moving  to and from a bed to a chair (including a wheelchair)?: None Help needed standing up from a chair using your arms (e.g., wheelchair or bedside chair)?: None Help needed to walk in hospital room?: None Help needed climbing 3-5 steps with a railing? : None 6 Click Score: 24    End of Session Equipment Utilized During Treatment: Gait belt Activity Tolerance: Patient tolerated treatment well Patient left: in chair;with call bell/phone within reach Nurse Communication: Mobility status PT Visit Diagnosis: Other abnormalities of gait and mobility (R26.89)    Time: 1430-1450 PT Time Calculation (min) (ACUTE ONLY): 20 min   Charges:   PT Evaluation $PT Eval Low Complexity: 1 Low            Carney Living PT DPT  11/17/2018, 3:47 PM

## 2018-11-17 NOTE — Progress Notes (Signed)
Bilateral lower extremity venous duplex completed. Refer to "CV Proc" under chart review to view preliminary results.  11/17/2018 4:04 PM Maudry Mayhew, MHA, RVT, RDCS, RDMS

## 2018-11-17 NOTE — Progress Notes (Signed)
Pt given IV Hydralizine 10mg  for SBP above 180 (187/90) at 0500.     Pt otherwise did well overnight. Up to the restroom twice with very little assistance. BP retaken 15 minutes later, see flow sheet. M.Shailene Demonbreun,RN 11/17/18 @0525 

## 2018-11-17 NOTE — Progress Notes (Signed)
Pt speech has improved today.  She is much more fluent when speaking, and less slurred.

## 2018-11-17 NOTE — Progress Notes (Signed)
  Speech Language Pathology Treatment:    Patient Details Name: Lisa Green MRN: [id number] DOB: 07-17-1950 Today's Date: 11/17/2018 Time: 4239-5320 SLP Time Calculation (min) (ACUTE ONLY): 28 min  Assessment / Plan / Recommendation Clinical Impression  Pt reported that she is pleased with her progress concerning speech and that her speech is now 80% back to baseline which is improved compared to yesterday's 50% return to baseline. She was able to recall all compensatory strategies for speech intelligibility and she stated that she has been using the compensatory strategies during conversation. Per the pt she has been able to communicate with her family and friends over the phone without any request for clarification. She stated that she was pleased that even her 19 year old friend with hearing loss was able to understand her without need for clarification. Pt completed labial and lingual strengthening and ROM exercises with min. cues. She used compensatory strategies for speech intelligibility at the conversational level with in. cues. During production of 8-9 word sentences she achieved 100% with use of strategies. SLP will continue to follow pt acutely but considering her progress she will likely not need continued SLP services following discharge.    HPI HPI: Pt is a 68 y/o female with history of HTN who presented to the ED with dysarthria and drooling. No LOC, no visual disturbances, no seizure-like activity. TPA was administered. CT of the head showed possible hyperdense left supraclinoid internal carotid artery and left MCA suspicious for acute thrombosis. No acute ischemic infarct. MRI of the brain revealed 3 cm region of acute infarction affecting the right posterior frontal operculum ir region.      SLP Plan  Continue with current plan of care       Recommendations                   Follow up Recommendations: Other (comment)(Considering the pt's progress, she likely will not  need SLP services following discharge) SLP Visit Diagnosis: Dysarthria and anarthria (R47.1) Plan: Continue with current plan of care       Karon Cotterill I. Hardin Negus, [place], [place] Office number [phone] Pager [place] 11/17/2018, 5:36 PM

## 2018-11-17 NOTE — Progress Notes (Signed)
STROKE TEAM PROGRESS NOTE   SUBJECTIVE (INTERVAL HISTORY) Her PT is at the bedside.  Overall her condition is stable.  Still has left facial droop and mild slurred speech.  She stated that she had a previous DVT when she was pregnant many years ago, no more DVT recurrence.  She was not on any estrogen or hormone therapy due to the history of DVT in the past.  She denies any smoking, or drug use.  She denies any heart palpitation.   OBJECTIVE Temp:  [98.1 F (36.7 C)-99.2 F (37.3 C)] 98.6 F (37 C) (06/18 1255) Pulse Rate:  [59-135] 75 (06/18 1426) Cardiac Rhythm: Normal sinus rhythm (06/18 0701) Resp:  [15-34] 24 (06/18 1426) BP: (132-187)/(78-109) 168/105 (06/18 1426) SpO2:  [90 %-100 %] 99 % (06/18 1426) Weight:  [95.6 kg] 95.6 kg (06/17 2314)  Recent Labs  Lab 11/16/18 0947  GLUCAP 93   Recent Labs  Lab 11/16/18 1003 11/17/18 0459  NA 140 141  K 4.0 3.3*  CL 106 107  CO2 23 24  GLUCOSE 112* 98  BUN 13 8  CREATININE 0.69 0.65  CALCIUM 9.5 9.3   Recent Labs  Lab 11/16/18 1003  AST 27  ALT 38  ALKPHOS 79  BILITOT 0.8  PROT 7.7  ALBUMIN 4.5   Recent Labs  Lab 11/16/18 1003 11/17/18 0459  WBC 6.0 7.4  NEUTROABS 3.5  --   HGB 14.0 13.8  HCT 41.5 40.9  MCV 93.0 93.6  PLT 235 229   No results for input(s): CKTOTAL, CKMB, CKMBINDEX, TROPONINI in the last 168 hours. No results for input(s): LABPROT, INR in the last 72 hours. No results for input(s): COLORURINE, LABSPEC, [place], GLUCOSEU, HGBUR, BILIRUBINUR, KETONESUR, PROTEINUR, UROBILINOGEN, NITRITE, LEUKOCYTESUR in the last 72 hours.  Invalid input(s): APPERANCEUR     Component Value Date/Time   CHOL 219 (H) 11/17/2018 0459   TRIG 234 (H) 11/17/2018 0459   HDL 40 (L) 11/17/2018 0459   CHOLHDL 5.5 11/17/2018 0459   VLDL 47 (H) 11/17/2018 0459   LDLCALC 132 (H) 11/17/2018 0459   Lab Results  Component Value Date   HGBA1C 5.4 11/17/2018   No results found for: LABOPIA, COCAINSCRNUR, LABBENZ,  AMPHETMU, THCU, LABBARB  No results for input(s): ETH in the last 168 hours.  I have personally reviewed the radiological images below and agree with the radiology interpretations.  Ct Angio Head W Or Wo Contrast  Result Date: 11/16/2018 CLINICAL DATA:  Code stroke. Slurred speech. Patient received tPA in emergency room EXAM: CT ANGIOGRAPHY HEAD AND NECK TECHNIQUE: Multidetector CT imaging of the head and neck was performed using the standard protocol during bolus administration of intravenous contrast. Multiplanar CT image reconstructions and MIPs were obtained to evaluate the vascular anatomy. Carotid stenosis measurements (when applicable) are obtained utilizing NASCET criteria, using the distal internal carotid diameter as the denominator. CONTRAST:  30mL OMNIPAQUE IOHEXOL 350 MG/ML SOLN COMPARISON:  CT head 11/16/2018 FINDINGS: CTA NECK FINDINGS Aortic arch: Standard branching. Imaged portion shows no evidence of aneurysm or dissection. No significant stenosis of the major arch vessel origins. Right carotid system: Right carotid system widely patent without stenosis or atherosclerotic disease Left carotid system: Left carotid system widely patent without stenosis or atherosclerotic disease. Vertebral arteries: Both vertebral arteries widely patent to the basilar without stenosis. Skeleton: No acute abnormality Other neck: Negative for mass or adenopathy in the neck. Upper chest: Lung apices clear bilaterally. Review of the MIP images confirms the above findings CTA HEAD  FINDINGS Anterior circulation: Left cavernous carotid widely patent. Left anterior and middle cerebral arteries widely patent. Previous CT suggested possible hyperdense left internal carotid artery and left MCA. Interval tPA. Vessels widely patent currently. No branch occlusion. Right cavernous carotid widely patent. Right anterior and middle cerebral arteries widely patent bilaterally. Posterior circulation: Both vertebral arteries  patent to the basilar. PICA patent bilaterally. Basilar widely patent. Superior cerebellar and posterior cerebral arteries widely patent bilaterally. Venous sinuses: Patent Anatomic variants: None Delayed phase: Not perform Review of the MIP images confirms the above findings IMPRESSION: 1. Normal CTA head neck. No large vessel occlusion or stenosis. Possible hyperdense left MCA on prior CT may have recanalized following tPA. No branch occlusion 2. No carotid or vertebral artery stenosis in the neck 3. These results were called by telephone at the time of interpretation on 11/16/2018 at 10:59 am to Dr. Neville Route , who verbally acknowledged these results. Electronically Signed   By: Franchot Gallo M.D.   On: 11/16/2018 11:01   Ct Angio Neck W And/or Wo Contrast  Result Date: 11/16/2018 CLINICAL DATA:  Code stroke. Slurred speech. Patient received tPA in emergency room EXAM: CT ANGIOGRAPHY HEAD AND NECK TECHNIQUE: Multidetector CT imaging of the head and neck was performed using the standard protocol during bolus administration of intravenous contrast. Multiplanar CT image reconstructions and MIPs were obtained to evaluate the vascular anatomy. Carotid stenosis measurements (when applicable) are obtained utilizing NASCET criteria, using the distal internal carotid diameter as the denominator. CONTRAST:  45mL OMNIPAQUE IOHEXOL 350 MG/ML SOLN COMPARISON:  CT head 11/16/2018 FINDINGS: CTA NECK FINDINGS Aortic arch: Standard branching. Imaged portion shows no evidence of aneurysm or dissection. No significant stenosis of the major arch vessel origins. Right carotid system: Right carotid system widely patent without stenosis or atherosclerotic disease Left carotid system: Left carotid system widely patent without stenosis or atherosclerotic disease. Vertebral arteries: Both vertebral arteries widely patent to the basilar without stenosis. Skeleton: No acute abnormality Other neck: Negative for mass or adenopathy in  the neck. Upper chest: Lung apices clear bilaterally. Review of the MIP images confirms the above findings CTA HEAD FINDINGS Anterior circulation: Left cavernous carotid widely patent. Left anterior and middle cerebral arteries widely patent. Previous CT suggested possible hyperdense left internal carotid artery and left MCA. Interval tPA. Vessels widely patent currently. No branch occlusion. Right cavernous carotid widely patent. Right anterior and middle cerebral arteries widely patent bilaterally. Posterior circulation: Both vertebral arteries patent to the basilar. PICA patent bilaterally. Basilar widely patent. Superior cerebellar and posterior cerebral arteries widely patent bilaterally. Venous sinuses: Patent Anatomic variants: None Delayed phase: Not perform Review of the MIP images confirms the above findings IMPRESSION: 1. Normal CTA head neck. No large vessel occlusion or stenosis. Possible hyperdense left MCA on prior CT may have recanalized following tPA. No branch occlusion 2. No carotid or vertebral artery stenosis in the neck 3. These results were called by telephone at the time of interpretation on 11/16/2018 at 10:59 am to Dr. Neville Route , who verbally acknowledged these results. Electronically Signed   By: Franchot Gallo M.D.   On: 11/16/2018 11:01   Mr Brain Wo Contrast  Result Date: 11/17/2018 CLINICAL DATA:  Stroke treated with tPA. EXAM: MRI HEAD WITHOUT CONTRAST TECHNIQUE: Multiplanar, multiecho pulse sequences of the brain and surrounding structures were obtained without intravenous contrast. COMPARISON:  CT studies done yesterday. FINDINGS: Brain: Diffusion imaging shows acute cortical and subcortical infarction of the right posterior frontal operculum ir  region. Region of involvement measures about 3 cm in size. Evidence of hemorrhage or mass effect. No other acute insult is evident. Elsewhere, brainstem and cerebellum are normal. Cerebral hemispheres are normal without evidence of  pre-existing insult. No mass lesion, hydrocephalus or extra-axial collection. Vascular: Major vessels at the base of the brain show flow. Skull and upper cervical spine: Negative Sinuses/Orbits: Clear/normal Other: None IMPRESSION: 3 cm region of acute infarction affecting the right posterior frontal operculum ir region. Mild swelling but no hemorrhage or mass effect. The brain is otherwise normal by MRI. Electronically Signed   By: Nelson Chimes M.D.   On: 11/17/2018 11:41   Ct Head Code Stroke Wo Contrast  Result Date: 11/16/2018 CLINICAL DATA:  Code stroke. Slurred speech. Right facial droop 9 a.m. today. EXAM: CT HEAD WITHOUT CONTRAST TECHNIQUE: Contiguous axial images were obtained from the base of the skull through the vertex without intravenous contrast. COMPARISON:  None. FINDINGS: Brain: Ventricle size normal. Negative for acute hemorrhage. Scattered mild white matter changes most likely due to ischemia indeterminate age. No acute cortical infarction. Negative for mass or edema. Vascular: Possible hyperdense left supraclinoid internal carotid artery and proximal left middle cerebral artery. Skull: Negative Sinuses/Orbits: Negative Other: None ASPECTS ([place] Stroke Program Early CT Score) - Ganglionic level infarction (caudate, lentiform nuclei, internal capsule, insula, M1-M3 cortex): 7 - Supraganglionic infarction (M4-M6 cortex): 3 Total score (0-10 with 10 being normal): 10 IMPRESSION: 1. Possible hyperdense left supraclinoid internal carotid artery and left MCA suspicious for acute thrombosis. CTA head and neck and CT perfusion brain recommended. 2. No acute ischemic infarct.  No intracranial hemorrhage 3. ASPECTS is 10 4. These results were called by telephone at the time of interpretation on 11/16/2018 at 10:08 am to Dr. Arta Silence , who verbally acknowledged these results. Electronically Signed   By: Franchot Gallo M.D.   On: 11/16/2018 10:09    PHYSICAL EXAM  Temp:  [98.1 F (36.7  C)-99.2 F (37.3 C)] 98.6 F (37 C) (06/18 1255) Pulse Rate:  [59-135] 75 (06/18 1426) Resp:  [15-34] 24 (06/18 1426) BP: (132-187)/(78-109) 168/105 (06/18 1426) SpO2:  [90 %-100 %] 99 % (06/18 1426) Weight:  [95.6 kg] 95.6 kg (06/17 2314)  General - Well nourished, well developed, in no apparent distress.  Ophthalmologic - fundi not visualized due to noncooperation.  Cardiovascular - Regular rate and rhythm.  Mental Status -  Level of arousal and orientation to time, place, and person were intact. Language including expression, naming, repetition, comprehension was assessed and found intact. Attention span and concentration were normal. Recent and remote memory were intact. Fund of Knowledge was assessed and was intact.  Cranial Nerves II - XII - II - Visual field intact OU. III, IV, VI - Extraocular movements intact. V - Facial sensation intact bilaterally. VII - left facial droop. VIII - Hearing & vestibular intact bilaterally. X - Palate elevates symmetrically, mild dysarthria. XI - Chin turning & shoulder shrug intact bilaterally. XII - Tongue protrusion to the left.  Motor Strength - The patient's strength was normal in all extremities and pronator drift was absent.  Bulk was normal and fasciculations were absent.   Motor Tone - Muscle tone was assessed at the neck and appendages and was normal.  Reflexes - The patient's reflexes were symmetrical in all extremities and she had no pathological reflexes.  Sensory - Light touch, temperature/pinprick were assessed and were symmetrical.    Coordination - The patient had normal movements in the hands  and feet with no ataxia or dysmetria.  Tremor was absent.  Gait and Station - deferred.   ASSESSMENT/PLAN Lisa Green is a 68 y.o. female with history of hypertension and obesity, hyperlipidemia admitted for left facial droop and slurred speech. [facility]PA given at [facility].    Stroke:  right MCA frontal cortex  infarct embolic pattern secondary to unknown source  Resultant left facial droop and slurred speech  MRI right MCA frontal cortex infarct  CTA head and neck unremarkable  2D Echo  Pending  LE venous Doppler pending  Recommend TEE and loop recorder to rule out cardioembolic source  LDL 941  HgbA1c 5.4  SCDs for VTE prophylaxis  No antithrombotic prior to admission, now on aspirin 325 mg daily after 24 hours TPA.   Patient counseled to be compliant with her antithrombotic medications  Ongoing aggressive stroke risk factor management  Therapy recommendations: Pending  Disposition: Pending  History of DVT  At the time of her pregnancy many years ago  Was treated with Coumadin  Normal recurrence  No hormonal replacement therapy given history of DVT  LE venous Doppler pending  Hypertension . Stable . Home medication - telmisartan . Permissive hypertension (OK if <180/105) for 24-48 hours post stroke and then gradually normalized within 2-3 days.  Long term BP goal normotensive  Hyperlipidemia  Home meds: Crestor 10  LDL 132, goal < 70  Now on Crestor 20  Continue statin at discharge  Other Stroke Risk Factors  Advanced age  Obesity, Body mass index is 34.02 kg/m.   Other Active Problems  Elevated TG  Hospital day # 1  Lisa Hawking, MD PhD Stroke Neurology 11/17/2018 2:42 PM    To contact Stroke Continuity provider, please refer to [email or web address] After hours, contact General Neurology

## 2018-11-17 NOTE — Progress Notes (Signed)
  Echocardiogram 2D Echocardiogram has been performed.  Lisa Green 11/17/2018, 1:33 PM

## 2018-11-18 ENCOUNTER — Encounter (HOSPITAL_COMMUNITY)
Admission: EM | Disposition: A | Discharge status: Home / Self Care | Payer: Self-pay | Source: Other Acute Inpatient Hospital | Attending: Neurology

## 2018-11-18 ENCOUNTER — Encounter (HOSPITAL_COMMUNITY): Payer: Self-pay | Admitting: *Deleted

## 2018-11-18 ENCOUNTER — Inpatient Hospital Stay (HOSPITAL_COMMUNITY): Payer: 59 | Admitting: Anesthesiology

## 2018-11-18 ENCOUNTER — Inpatient Hospital Stay (HOSPITAL_COMMUNITY): Payer: 59

## 2018-11-18 DIAGNOSIS — I6389 Other cerebral infarction: Secondary | ICD-10-CM

## 2018-11-18 DIAGNOSIS — E785 Hyperlipidemia, unspecified: Secondary | ICD-10-CM

## 2018-11-18 HISTORY — PX: BUBBLE STUDY: SHX6837

## 2018-11-18 HISTORY — PX: TEE WITHOUT CARDIOVERSION: SHX5443

## 2018-11-18 HISTORY — PX: LOOP RECORDER INSERTION: EP1214

## 2018-11-18 LAB — BASIC METABOLIC PANEL
Anion gap: 8 (ref 5–15)
BUN: 11 mg/dL (ref 8–23)
CO2: 23 mmol/L (ref 22–32)
Calcium: 9.2 mg/dL (ref 8.9–10.3)
Chloride: 110 mmol/L (ref 98–111)
Creatinine, Ser: 0.73 mg/dL (ref 0.44–1.00)
GFR calc Af Amer: >60 mL/min (ref >60)
GFR calc non Af Amer: >60 mL/min (ref >60)
Glucose, Bld: 95 mg/dL (ref 70–99)
Potassium: 4 mmol/L (ref 3.5–5.1)
Sodium: 141 mmol/L (ref 135–145)

## 2018-11-18 LAB — CBC
HCT: 40.1 % (ref 36.0–46.0)
Hemoglobin: 13.5 g/dL (ref 12.0–15.0)
MCH: 31.5 pg (ref 26.0–34.0)
MCHC: 33.7 g/dL (ref 30.0–36.0)
MCV: 93.7 fL (ref 80.0–100.0)
Platelets: 211 K/uL (ref 150–400)
RBC: 4.28 MIL/uL (ref 3.87–5.11)
RDW: 12.8 % (ref 11.5–15.5)
WBC: 6.6 K/uL (ref 4.0–10.5)
nRBC: 0 % (ref 0.0–0.2)

## 2018-11-18 SURGERY — ECHOCARDIOGRAM, TRANSESOPHAGEAL
Anesthesia: General

## 2018-11-18 SURGERY — LOOP RECORDER INSERTION
Anesthesia: LOCAL

## 2018-11-18 MED ORDER — CLOPIDOGREL BISULFATE 75 MG PO TABS: 75.0000 mg | ORAL_TABLET | Freq: Every day | ORAL | Status: DC

## 2018-11-18 MED ORDER — PROPOFOL 10 MG/ML IV BOLUS
INTRAVENOUS | Status: DC | PRN
  Administered 2018-11-18 (×2): 30 mg via INTRAVENOUS

## 2018-11-18 MED ORDER — SODIUM CHLORIDE 0.9 % IV SOLN
INTRAVENOUS | Status: DC
  Administered 2018-11-18: 12:00:00 via INTRAVENOUS

## 2018-11-18 MED ORDER — ASPIRIN EC 81 MG PO TBEC: 81.0000 mg | DELAYED_RELEASE_TABLET | Freq: Every day | ORAL | Status: DC

## 2018-11-18 MED ORDER — LIDOCAINE-EPINEPHRINE 1 %-1:100000 IJ SOLN
INTRAMUSCULAR | Status: AC
  Filled 2018-11-18: qty 1

## 2018-11-18 MED ORDER — CLOPIDOGREL BISULFATE 75 MG PO TABS: 75.0000 mg | ORAL_TABLET | Freq: Every day | ORAL | 0 refills | Status: DC

## 2018-11-18 MED ORDER — LIDOCAINE 2% (20 MG/ML) 5 ML SYRINGE
INTRAMUSCULAR | Status: DC | PRN
  Administered 2018-11-18: 60 mg via INTRAVENOUS

## 2018-11-18 MED ORDER — LIDOCAINE-EPINEPHRINE 1 %-1:100000 IJ SOLN
INTRAMUSCULAR | Status: DC | PRN
  Administered 2018-11-18: 30 mL

## 2018-11-18 MED ORDER — ROSUVASTATIN CALCIUM 20 MG PO TABS: 20.0000 mg | ORAL_TABLET | Freq: Every day | ORAL | 2 refills | Status: DC

## 2018-11-18 MED ORDER — ASPIRIN 81 MG PO TBEC: 81.0000 mg | DELAYED_RELEASE_TABLET | Freq: Every day | ORAL | Status: DC

## 2018-11-18 MED ORDER — PROPOFOL 500 MG/50ML IV EMUL
INTRAVENOUS | Status: DC | PRN
  Administered 2018-11-18: 125 ug/kg/min via INTRAVENOUS

## 2018-11-18 MED ORDER — SODIUM CHLORIDE BACTERIOSTATIC 0.9 % IJ SOLN
INTRAMUSCULAR | Status: DC | PRN
  Administered 2018-11-18: 9 mL via INTRAVENOUS

## 2018-11-18 SURGICAL SUPPLY — 2 items
LOOP REVEAL LINQSYS (Prosthesis & Implant Heart) ×2 IMPLANT
PACK LOOP INSERTION (CUSTOM PROCEDURE TRAY) ×2 IMPLANT

## 2018-11-18 NOTE — Interval H&P Note (Signed)
History and Physical Interval Note:  11/18/2018 11:33 AM  Lisa Green  has presented today for surgery, with the diagnosis of stroke.  The various methods of treatment have been discussed with the patient and family. After consideration of risks, benefits and other options for treatment, the patient has consented to  Procedure(s): LOOP RECORDER INSERTION (N/A) as a surgical intervention.  The patient's history has been reviewed, patient examined, no change in status, stable for surgery.  I have reviewed the patient's chart and labs.  Questions were answered to the patient's satisfaction.     Thompson Grayer

## 2018-11-18 NOTE — Progress Notes (Signed)
NURSING PROGRESS NOTE  LURAE HORNBROOK [id number] Discharge Data: 11/18/2018 5:19 PM Attending Provider: Rosalin Hawking, MD [id number], Aris Everts, MD     Catha Nottingham to be D/C'd Home per MD order.  Discussed with the patient the After Visit Summary and all questions fully answered. All IV's discontinued with no bleeding noted. All belongings returned to patient for patient to take home.   Last Vital Signs:  Blood pressure (!) 180/88, pulse 80, temperature 98.4 F (36.9 C), temperature source Oral, resp. rate 17, height 5\' 6"  (1.676 m), weight 95.6 kg, SpO2 99 %.  Discharge Medication List Allergies as of 11/18/2018      Reactions   Wasp Venom Protein Anaphylaxis   Insect Extract Allergy Skin Test    Insect Stings      Medication List    TAKE these medications   aspirin 81 MG EC tablet Take 1 tablet (81 mg total) by mouth daily. Start taking on: November 19, 2018   clopidogrel 75 MG tablet Commonly known as: PLAVIX Take 1 tablet (75 mg total) by mouth daily. Take aspirin and plavix together x 21 days then stop plavix. Continue aspirin. Start taking on: November 19, 2018   rosuvastatin 20 MG tablet Commonly known as: CRESTOR Take 1 tablet (20 mg total) by mouth daily. What changed:   medication strength  how much to take   telmisartan 20 MG tablet Commonly known as: MICARDIS Take 1 tablet (20 mg total) by mouth daily.

## 2018-11-18 NOTE — Consult Note (Addendum)
ELECTROPHYSIOLOGY CONSULT NOTE  Patient ID: Lisa Green MRN: [id number], DOB/AGE: 10/01/1950   Admit date: 11/16/2018 Date of Consult: 11/18/2018  Primary Physician: Crecencio Mc, MD Primary Cardiologist: No primary care provider on file.  Primary Electrophysiologist: New to None  Reason for Consultation: Cryptogenic stroke; recommendations regarding Implantable Loop Recorder  History of Present  EP has been asked to evaluate Lisa Green for placement of an implantable loop recorder to monitor for atrial fibrillation by Dr Erlinda Hong.  The patient was admitted on 11/16/2018 with left facial droop and slurred speech. They first developed symptoms while at home with sudden onset. Given IV TPA.  Imaging demonstrated right MCA frontal cortex CVA in embolic pattern.  she has undergone workup for stroke including echocardiogram. Carotids widely patent by CTA  The patient has been monitored on telemetry which has demonstrated sinus rhythm with no arrhythmias.  Inpatient stroke work-up is to be completed with a TEE.  She has a remote history of DVT in the setting of pregnancy. LE DVT US negative this admission. Previously tolerated coumadin without difficulty.   Echocardiogram this admission demonstrated EF 60-65%, normal LA, and no gross valvular abnormalities. Lab work is reviewed.  Prior to admission, the patient denies chest pain, shortness of breath, dizziness, palpitations, or syncope.  They are recovering from their stroke with plans to go home at discharge.  Past Medical History:  Diagnosis Date   Diverticulosis 2013   colonoscopy, [place]   Hyperlipidemia    Obesity (BMI 30.0-34.9)      Surgical History:  Past Surgical History:  Procedure Laterality Date   ABDOMINAL HYSTERECTOMY     with wedge resection of right ovary , normal path   [place]     ENDOVENOUS ABLATION SAPHENOUS VEIN W/ LASER  2008   for  varicose veins,  [place], [place]     Medications Prior to Admission  Medication Sig Dispense Refill Last Dose   rosuvastatin (CRESTOR) 10 MG tablet TAKE 1 TABLET (10 MG TOTAL) DAILY BY MOUTH. 90 tablet 1    telmisartan (MICARDIS) 20 MG tablet Take 1 tablet (20 mg total) by mouth daily. 30 tablet 5     Inpatient Medications:   aspirin EC  325 mg Oral Daily   pantoprazole  40 mg Oral Daily   rosuvastatin  20 mg Oral Daily    Allergies:  Allergies  Allergen Reactions   Wasp Venom Protein Anaphylaxis   Insect Extract Allergy Skin Test     Insect Stings    Social History   Socioeconomic History   Marital status: Married    Spouse name: Not on file   Number of children: Not on file   Years of education: Not on file   Highest education level: Not on file  Occupational History   Occupation: Production assistant, radio    Comment: OT at [employer] resource strain: Not on file   Food insecurity    Worry: Not on file    Inability: Not on file   Transportation needs    Medical: Not on file    Non-medical: Not on file  Tobacco Use   Smoking status: Never Smoker   Smokeless tobacco: Never Used  Substance and Sexual Activity   Alcohol use: Yes    Comment: 2 oz red wine daily   Drug use: No   Sexual activity: Not  on file  Lifestyle   Physical activity    Days per week: Not on file    Minutes per session: Not on file   Stress: Not on file  Relationships   Social connections    Talks on phone: Not on file    Gets together: Not on file    Attends religious service: Not on file    Active member of club or organization: Not on file    Attends meetings of clubs or organizations: Not on file    Relationship status: Not on file   Intimate partner violence    Fear of current or ex partner: Not on file    Emotionally abused: Not on file    Physically abused: Not on file    Forced sexual activity: Not on file  Other Topics Concern   Not  on file  Social History Narrative   Not on file     Family History  Problem Relation Age of Onset   Coronary artery disease Father 21   Stroke Father    Coronary artery disease Paternal Uncle    Heart disease Paternal Uncle    Coronary artery disease Paternal Grandmother    Heart disease Paternal Grandmother    Coronary artery disease Paternal Grandfather    Heart disease Paternal Grandfather    Heart disease Mother    Cancer Sister 38       ovarian ca   Heart disease Brother    Breast cancer Neg Hx       Review of Systems: All other systems reviewed and are otherwise negative except as noted above.  Physical Exam: Vitals:   11/17/18 2200 11/17/18 2300 11/18/18 0000 11/18/18 0400  BP:   (!) 150/108 129/84  Pulse: (!) 58 (!) 58 69 (!) 57  Resp: (!) 21     Temp:   98.6 F (37 C) 98.3 F (36.8 C)  TempSrc:   Oral Oral  SpO2:   98%   Weight:      Height:        GEN- The patient is well appearing, alert and oriented x 3 today.   Head- normocephalic, atraumatic Eyes-  Sclera clear, conjunctiva pink Ears- hearing intact Oropharynx- clear Neck- supple Lungs- Clear to ausculation bilaterally, normal work of breathing Heart- Regular rate and rhythm, no murmurs, rubs or gallops  GI- soft, NT, ND, + BS Extremities- no clubbing, cyanosis, or edema MS- no significant deformity or atrophy Skin- no rash or lesion Psych- euthymic mood, full affect   Labs:   Lab Results  Component Value Date   WBC 6.6 11/18/2018   HGB 13.5 11/18/2018   HCT 40.1 11/18/2018   MCV 93.7 11/18/2018   PLT 211 11/18/2018    Recent Labs  Lab 11/16/18 1003  11/18/18 0522  NA 140   < > 141  K 4.0   < > 4.0  CL 106   < > 110  CO2 23   < > 23  BUN 13   < > 11  CREATININE 0.69   < > 0.73  CALCIUM 9.5   < > 9.2  PROT 7.7  --   --   BILITOT 0.8  --   --   ALKPHOS 79  --   --   ALT 38  --   --   AST 27  --   --   GLUCOSE 112*   < > 95   < > = values in this interval not  displayed.     Radiology/Studies: Ct Angio Head W Or Wo Contrast  Result Date: 11/16/2018 CLINICAL DATA:  Code stroke. Slurred speech. Patient received tPA in emergency room EXAM: CT ANGIOGRAPHY HEAD AND NECK TECHNIQUE: Multidetector CT imaging of the head and neck was performed using the standard protocol during bolus administration of intravenous contrast. Multiplanar CT image reconstructions and MIPs were obtained to evaluate the vascular anatomy. Carotid stenosis measurements (when applicable) are obtained utilizing NASCET criteria, using the distal internal carotid diameter as the denominator. CONTRAST:  82mL OMNIPAQUE IOHEXOL 350 MG/ML SOLN COMPARISON:  CT head 11/16/2018 FINDINGS: CTA NECK FINDINGS Aortic arch: Standard branching. Imaged portion shows no evidence of aneurysm or dissection. No significant stenosis of the major arch vessel origins. Right carotid system: Right carotid system widely patent without stenosis or atherosclerotic disease Left carotid system: Left carotid system widely patent without stenosis or atherosclerotic disease. Vertebral arteries: Both vertebral arteries widely patent to the basilar without stenosis. Skeleton: No acute abnormality Other neck: Negative for mass or adenopathy in the neck. Upper chest: Lung apices clear bilaterally. Review of the MIP images confirms the above findings CTA HEAD FINDINGS Anterior circulation: Left cavernous carotid widely patent. Left anterior and middle cerebral arteries widely patent. Previous CT suggested possible hyperdense left internal carotid artery and left MCA. Interval tPA. Vessels widely patent currently. No branch occlusion. Right cavernous carotid widely patent. Right anterior and middle cerebral arteries widely patent bilaterally. Posterior circulation: Both vertebral arteries patent to the basilar. PICA patent bilaterally. Basilar widely patent. Superior cerebellar and posterior cerebral arteries widely patent bilaterally.  Venous sinuses: Patent Anatomic variants: None Delayed phase: Not perform Review of the MIP images confirms the above findings IMPRESSION: 1. Normal CTA head neck. No large vessel occlusion or stenosis. Possible hyperdense left MCA on prior CT may have recanalized following tPA. No branch occlusion 2. No carotid or vertebral artery stenosis in the neck 3. These results were called by telephone at the time of interpretation on 11/16/2018 at 10:59 am to Dr. Neville Route , who verbally acknowledged these results. Electronically Signed   By: Franchot Gallo M.D.   On: 11/16/2018 11:01   Ct Angio Neck W And/or Wo Contrast  Result Date: 11/16/2018 CLINICAL DATA:  Code stroke. Slurred speech. Patient received tPA in emergency room EXAM: CT ANGIOGRAPHY HEAD AND NECK TECHNIQUE: Multidetector CT imaging of the head and neck was performed using the standard protocol during bolus administration of intravenous contrast. Multiplanar CT image reconstructions and MIPs were obtained to evaluate the vascular anatomy. Carotid stenosis measurements (when applicable) are obtained utilizing NASCET criteria, using the distal internal carotid diameter as the denominator. CONTRAST:  55mL OMNIPAQUE IOHEXOL 350 MG/ML SOLN COMPARISON:  CT head 11/16/2018 FINDINGS: CTA NECK FINDINGS Aortic arch: Standard branching. Imaged portion shows no evidence of aneurysm or dissection. No significant stenosis of the major arch vessel origins. Right carotid system: Right carotid system widely patent without stenosis or atherosclerotic disease Left carotid system: Left carotid system widely patent without stenosis or atherosclerotic disease. Vertebral arteries: Both vertebral arteries widely patent to the basilar without stenosis. Skeleton: No acute abnormality Other neck: Negative for mass or adenopathy in the neck. Upper chest: Lung apices clear bilaterally. Review of the MIP images confirms the above findings CTA HEAD FINDINGS Anterior circulation:  Left cavernous carotid widely patent. Left anterior and middle cerebral arteries widely patent. Previous CT suggested possible hyperdense left internal carotid artery and left MCA. Interval tPA. Vessels widely patent currently. No branch  occlusion. Right cavernous carotid widely patent. Right anterior and middle cerebral arteries widely patent bilaterally. Posterior circulation: Both vertebral arteries patent to the basilar. PICA patent bilaterally. Basilar widely patent. Superior cerebellar and posterior cerebral arteries widely patent bilaterally. Venous sinuses: Patent Anatomic variants: None Delayed phase: Not perform Review of the MIP images confirms the above findings IMPRESSION: 1. Normal CTA head neck. No large vessel occlusion or stenosis. Possible hyperdense left MCA on prior CT may have recanalized following tPA. No branch occlusion 2. No carotid or vertebral artery stenosis in the neck 3. These results were called by telephone at the time of interpretation on 11/16/2018 at 10:59 am to Dr. Neville Route , who verbally acknowledged these results. Electronically Signed   By: Franchot Gallo M.D.   On: 11/16/2018 11:01   Mr Brain Wo Contrast  Result Date: 11/17/2018 CLINICAL DATA:  Stroke treated with tPA. EXAM: MRI HEAD WITHOUT CONTRAST TECHNIQUE: Multiplanar, multiecho pulse sequences of the brain and surrounding structures were obtained without intravenous contrast. COMPARISON:  CT studies done yesterday. FINDINGS: Brain: Diffusion imaging shows acute cortical and subcortical infarction of the right posterior frontal operculum ir region. Region of involvement measures about 3 cm in size. Evidence of hemorrhage or mass effect. No other acute insult is evident. Elsewhere, brainstem and cerebellum are normal. Cerebral hemispheres are normal without evidence of pre-existing insult. No mass lesion, hydrocephalus or extra-axial collection. Vascular: Major vessels at the base of the brain show flow. Skull and  upper cervical spine: Negative Sinuses/Orbits: Clear/normal Other: None IMPRESSION: 3 cm region of acute infarction affecting the right posterior frontal operculum ir region. Mild swelling but no hemorrhage or mass effect. The brain is otherwise normal by MRI. Electronically Signed   By: Nelson Chimes M.D.   On: 11/17/2018 11:41   Ct Head Code Stroke Wo Contrast  Result Date: 11/16/2018 CLINICAL DATA:  Code stroke. Slurred speech. Right facial droop 9 a.m. today. EXAM: CT HEAD WITHOUT CONTRAST TECHNIQUE: Contiguous axial images were obtained from the base of the skull through the vertex without intravenous contrast. COMPARISON:  None. FINDINGS: Brain: Ventricle size normal. Negative for acute hemorrhage. Scattered mild white matter changes most likely due to ischemia indeterminate age. No acute cortical infarction. Negative for mass or edema. Vascular: Possible hyperdense left supraclinoid internal carotid artery and proximal left middle cerebral artery. Skull: Negative Sinuses/Orbits: Negative Other: None ASPECTS ([place] Stroke Program Early CT Score) - Ganglionic level infarction (caudate, lentiform nuclei, internal capsule, insula, M1-M3 cortex): 7 - Supraganglionic infarction (M4-M6 cortex): 3 Total score (0-10 with 10 being normal): 10 IMPRESSION: 1. Possible hyperdense left supraclinoid internal carotid artery and left MCA suspicious for acute thrombosis. CTA head and neck and CT perfusion brain recommended. 2. No acute ischemic infarct.  No intracranial hemorrhage 3. ASPECTS is 10 4. These results were called by telephone at the time of interpretation on 11/16/2018 at 10:08 am to Dr. Arta Silence , who verbally acknowledged these results. Electronically Signed   By: Franchot Gallo M.D.   On: 11/16/2018 10:09   Vas [place] Lower Extremity Venous (dvt)  Result Date: 11/17/2018  Lower Venous Study Indications: Stroke.  Performing Technologist: Maudry Mayhew MHA, RDMS, RVT, RDCS  Examination  Guidelines: A complete evaluation includes B-mode imaging, spectral Doppler, color Doppler, and power Doppler as needed of all accessible portions of each vessel. Bilateral testing is considered an integral part of a complete examination. Limited examinations for reoccurring indications may be performed as noted.  +---------+---------------+---------+-----------+----------+-------+  RIGHT  Compressibility Phasicity Spontaneity Properties Summary  +---------+---------------+---------+-----------+----------+-------+  CFV       Full            Yes       Yes                             +---------+---------------+---------+-----------+----------+-------+  SFJ       Full                                                      +---------+---------------+---------+-----------+----------+-------+  FV Prox   Full                                                      +---------+---------------+---------+-----------+----------+-------+  FV Mid    Full                                                      +---------+---------------+---------+-----------+----------+-------+  FV Distal Full                                                      +---------+---------------+---------+-----------+----------+-------+  PFV       Full                                                      +---------+---------------+---------+-----------+----------+-------+  POP       Full            Yes       Yes                             +---------+---------------+---------+-----------+----------+-------+  PTV       Full                                                      +---------+---------------+---------+-----------+----------+-------+  PERO      Full                                                      +---------+---------------+---------+-----------+----------+-------+   +---------+---------------+---------+-----------+----------+-------+  LEFT      Compressibility Phasicity Spontaneity Properties Summary   +---------+---------------+---------+-----------+----------+-------+  CFV       Full            Yes       Yes                             +---------+---------------+---------+-----------+----------+-------+  SFJ       Full                                                      +---------+---------------+---------+-----------+----------+-------+  FV Prox   Full                                                      +---------+---------------+---------+-----------+----------+-------+  FV Mid    Full                                                      +---------+---------------+---------+-----------+----------+-------+  FV Distal Full                                                      +---------+---------------+---------+-----------+----------+-------+  PFV       Full                                                      +---------+---------------+---------+-----------+----------+-------+  POP       Full            Yes       Yes                             +---------+---------------+---------+-----------+----------+-------+  PTV       Full                                                      +---------+---------------+---------+-----------+----------+-------+  PERO      Full                                                      +---------+---------------+---------+-----------+----------+-------+     Summary: Right: There is no evidence of deep vein thrombosis in the lower extremity. No cystic structure found in the popliteal fossa. Left: There is no evidence of deep vein thrombosis in the lower extremity. No cystic structure found in the popliteal fossa.  *See table(s) above for measurements and observations. Electronically signed by Servando Snare MD on 11/17/2018 at 4:17:54 PM.    Final     12-lead ECGs show normal sinus rhythm (personally reviewed) All prior EKGs in EPIC reviewed with no documented atrial fibrillation  Telemetry NSR (personally reviewed)  Assessment and Plan:  1. Cryptogenic stroke The patient  presents with cryptogenic stroke.  The patient has a TEE planned for this PM.  I spoke at length with the patient about monitoring for afib with an implantable loop recorder.  Risks, benefits, and alteratives to implantable loop recorder were discussed with the patient today.   At this time, the patient is very clear in their decision to proceed with implantable loop recorder.  Wound care was reviewed with the patient (keep incision clean and dry for 3 days).  Wound check scheduled and entered in AVS.  Please call with questions.  Shirley Friar, PA-C 11/18/2018 7:19 AM  I have seen, examined the patient, and reviewed the above assessment and plan.  Changes to above are made where necessary.  On exam, RRR.  I agree with Dr Erlinda Hong that loop recorder is indicated to further evaluate for afib as the cause of her stroke.  Risks and benefits to ILR discussed with the patient who wishes to proceed.  Co Sign: Thompson Grayer, MD 11/18/2018 11:32 AM

## 2018-11-18 NOTE — Transfer of Care (Signed)
Immediate Anesthesia Transfer of Care Note  Patient: Lisa Green  Procedure(s) Performed: TRANSESOPHAGEAL ECHOCARDIOGRAM (TEE) (N/A ) BUBBLE STUDY  Patient Location: Endoscopy Unit  Anesthesia Type:MAC  Level of Consciousness: drowsy  Airway & Oxygen Therapy: Patient Spontanous Breathing and Patient connected to nasal cannula oxygen  Post-op Assessment: Report given to RN and Post -op Vital signs reviewed and stable  Post vital signs: Reviewed and stable  Last Vitals:  Vitals Value Taken Time  BP 147/86 11/18/18 1155  Temp    Pulse 75 11/18/18 1156  Resp 23 11/18/18 1156  SpO2 97 % 11/18/18 1156  Vitals shown include unvalidated device data.  Last Pain:  Vitals:   11/18/18 1100  TempSrc: Oral  PainSc: 0-No pain      Patients Stated Pain Goal: 1 (25/36/64 4034)  Complications: No apparent anesthesia complications

## 2018-11-18 NOTE — Anesthesia Preprocedure Evaluation (Signed)
Anesthesia Evaluation  Patient identified by MRN, date of birth, ID band Patient awake    Reviewed: Allergy & Precautions, NPO status , Patient's Chart, lab work & pertinent test results  Airway Mallampati: III  TM Distance: >3 FB Neck ROM: Full    Dental  (+) Teeth Intact, Dental Advisory Given   Pulmonary    breath sounds clear to auscultation       Cardiovascular  Rhythm:Regular Rate:Normal     Neuro/Psych    GI/Hepatic   Endo/Other    Renal/GU      Musculoskeletal   Abdominal (+) + obese,   Peds  Hematology   Anesthesia Other Findings   Reproductive/Obstetrics                             Anesthesia Physical Anesthesia Plan  ASA: III  Anesthesia Plan: General   Post-op Pain Management:    Induction: Intravenous  PONV Risk Score and Plan:   Airway Management Planned: Nasal Cannula and Natural Airway  Additional Equipment:   Intra-op Plan:   Post-operative Plan:   Informed Consent: I have reviewed the patients History and Physical, chart, labs and discussed the procedure including the risks, benefits and alternatives for the proposed anesthesia with the patient or authorized representative who has indicated his/her understanding and acceptance.     Dental advisory given  Plan Discussed with: Anesthesiologist and CRNA  Anesthesia Plan Comments:         Anesthesia Quick Evaluation

## 2018-11-18 NOTE — Anesthesia Procedure Notes (Signed)
Procedure Name: MAC Date/Time: 11/18/2018 11:40 AM Performed by: Imagene Riches, CRNA Pre-anesthesia Checklist: Patient identified, Emergency Drugs available, Suction available, Patient being monitored and Timeout performed Patient Re-evaluated:Patient Re-evaluated prior to induction Oxygen Delivery Method: Nasal cannula

## 2018-11-18 NOTE — Plan of Care (Signed)
  Problem: Education: Goal: Knowledge of secondary prevention will improve Outcome: Adequate for Discharge   Problem: Education: Goal: Knowledge of patient specific risk factors addressed and post discharge goals established will improve Outcome: Adequate for Discharge   Problem: Education: Goal: Individualized Educational Video(s) Outcome: Adequate for Discharge   Problem: Self-Care: Goal: Verbalization of feelings and concerns over difficulty with self-care will improve Outcome: Adequate for Discharge   Problem: Self-Care: Goal: Ability to communicate needs accurately will improve Outcome: Adequate for Discharge

## 2018-11-18 NOTE — Progress Notes (Deleted)
   11/18/18 1352  Vitals  Temp 98.1 F (36.7 C)  Temp Source Axillary  BP 134/82  MAP (mmHg) 93  BP Location Right Arm  BP Method Automatic  Patient Position (if appropriate) Lying  Pulse Rate (!) 103  Pulse Rate Source Dinamap  Resp 20  Oxygen Therapy  SpO2 98 %  O2 Device Room Air     Response to call from pt's family suspecting pt pos. Having a seizer. Per family, pt was snoring with her eyes open then suddenly blank out, unable to response to voice or touch command. VS as belove. MD notified. Pt ic currently resting with eyes closed, able to response with stimuli. Will cont to monitor,   [facility]

## 2018-11-18 NOTE — Progress Notes (Signed)
Patient returned from Endo. Site CDI. VSS. Diet resume.No other distress voice. Will continue to monitor.  Ave Filter, RN

## 2018-11-18 NOTE — Progress Notes (Signed)
  Echocardiogram Echocardiogram Transesophageal has been performed.  Lisa Green 11/18/2018, 12:08 PM

## 2018-11-18 NOTE — Progress Notes (Signed)
  Speech Language Pathology Treatment: Cognitive-Linquistic  Patient Details Name: Lisa Green MRN: [id number] DOB: Sep 21, 1950 Today's Date: 11/18/2018 Time: 0211-1735 SLP Time Calculation (min) (ACUTE ONLY): 18 min  Assessment / Plan / Recommendation Clinical Impression  Pt was seen for dysarthria treatment and was cooperative during the session. She reported that she believes that her speech has improved further to 85% back to baseline and that her doctor has commented on her improvement. She completed labial and lingual ROM and strengthening exercises without prompts/cues. She used compensatory strategies for speech intelligibility at the paragraph level with 100% accuracy and required minimal cues during conversation when she became excited about the conversational topic. A home program has been established to facilitate further improvement in articulatory precision and the pt indicated that she intends to complete it. Continued SLP services will not be needed following discharge.     HPI HPI: Pt is a 68 y/o female with history of HTN who presented to the ED with dysarthria and drooling. No LOC, no visual disturbances, no seizure-like activity. TPA was administered. CT of the head showed possible hyperdense left supraclinoid internal carotid artery and left MCA suspicious for acute thrombosis. No acute ischemic infarct. MRI of the brain revealed 3 cm region of acute infarction affecting the right posterior frontal operculum ir region.      SLP Plan  Continue with current plan of care       Recommendations                   Follow up Recommendations: None SLP Visit Diagnosis: Dysarthria and anarthria (R47.1) Plan: Continue with current plan of care       Lisa Green I. Hardin Negus, [place], [place] Office number [phone] Pager [place] 11/18/2018, 4:10 PM

## 2018-11-18 NOTE — Anesthesia Postprocedure Evaluation (Signed)
Anesthesia Post Note  Patient: Lisa Green  Procedure(s) Performed: TRANSESOPHAGEAL ECHOCARDIOGRAM (TEE) (N/A ) BUBBLE STUDY     Patient location during evaluation: PACU Anesthesia Type: General Level of consciousness: awake and alert Pain management: pain level controlled Vital Signs Assessment: post-procedure vital signs reviewed and stable Respiratory status: spontaneous breathing, nonlabored ventilation, respiratory function stable and patient connected to nasal cannula oxygen Cardiovascular status: stable and blood pressure returned to baseline Postop Assessment: no apparent nausea or vomiting Anesthetic complications: no    Last Vitals:  Vitals:   11/18/18 1400 11/18/18 1542  BP: (!) 156/89 (!) 180/88  Pulse: 66 80  Resp: 18 17  Temp: 36.7 C 36.9 C  SpO2: 100% 99%    Last Pain:  Vitals:   11/18/18 1542  TempSrc: Oral  PainSc:                  Amrit Cress COKER

## 2018-11-18 NOTE — CV Procedure (Signed)
   TRANSESOPHAGEAL ECHOCARDIOGRAM (TEE) NOTE  INDICATIONS: cryptogenic stroke  PROCEDURE:   Informed consent was obtained prior to the procedure. The risks, benefits and alternatives for the procedure were discussed and the patient comprehended these risks.  Risks include, but are not limited to, cough, sore throat, vomiting, nausea, somnolence, esophageal and stomach trauma or perforation, bleeding, low blood pressure, aspiration, pneumonia, infection, trauma to the teeth and death.    After a procedural time-out, the patient was given propofol per anesthesia for  sedation.  The patient's heart rate, blood pressure, and oxygen saturation are monitored continuously during the procedure.The oropharynx was anesthetized with topical cetacaine.  The transesophageal probe was inserted in the esophagus and stomach without difficulty and multiple views were obtained.  The patient was kept under observation until the patient left the procedure room. The patient left the procedure room in stable condition.   Agitated microbubble saline contrast was administered.  COMPLICATIONS:    There were no immediate complications.  Findings:  1. LEFT VENTRICLE: The left ventricular wall thickness is mildly increased.  The left ventricular cavity is normal in size. Wall motion is normal.  LVEF is 60-65%.  2. RIGHT VENTRICLE:  The right ventricle is normal in structure and function without any thrombus or masses.    3. LEFT ATRIUM:  The left atrium is mildly dilated in size without any thrombus or masses.  There is not spontaneous echo contrast ("smoke") in the left atrium consistent with a low flow state.  4. LEFT ATRIAL APPENDAGE:  The left atrial appendage is free of any thrombus or masses. The appendage has single lobes. Pulse doppler indicates moderate flow in the appendage.  5. ATRIAL SEPTUM:  The atrial septum appears intact and is free of thrombus and/or masses.  There is no evidence for interatrial  shunting by color doppler and saline microbubble.  6. RIGHT ATRIUM:  The right atrium is normal in size and function without any thrombus or masses.  7. MITRAL VALVE:  The mitral valve is normal in structure and function with no regurgitation.  There were no vegetations or stenosis.  8. AORTIC VALVE:  The aortic valve is trileaflet, normal in structure and function with trivial regurgitation.  There were no vegetations or stenosis  9. TRICUSPID VALVE:  The tricuspid valve is normal in structure and function with trivial regurgitation.  There were no vegetations or stenosis  10.  PULMONIC VALVE:  The pulmonic valve is normal in structure and function with no regurgitation.  There were no vegetations or stenosis.   11. AORTIC ARCH, ASCENDING AND DESCENDING AORTA:  There was no Ron Parker et. Al, 1992) atherosclerosis of the ascending aorta, aortic arch, or proximal descending aorta.  12. PULMONARY VEINS: Anomalous pulmonary venous return was not noted.  13. PERICARDIUM: The pericardium appeared normal and non-thickened.  There is no pericardial effusion.  IMPRESSION:   1. No LAA thrombus 2. Negative for PFO 3. Trivial AI 4. LVEF 60-65%  RECOMMENDATIONS:    1.  Consider proceeding with implanted loop recorder.  Time Spent Directly with the Patient:  45 minutes   [name], MD, [facility] [place], [place] Director of the [facility] Diplomate of the American Board of Clinical Lipidology Attending Cardiologist  Direct Dial: [phone]  Fax: [phone]  Website:  [email or web address] Bricelyn Freestone 11/18/2018, 11:53 AM

## 2018-11-18 NOTE — H&P (View-Only) (Signed)
ELECTROPHYSIOLOGY CONSULT NOTE  Patient ID: Lisa Green MRN: [id number], DOB/AGE: February 28, 1951   Admit date: 11/16/2018 Date of Consult: 11/18/2018  Primary Physician: Crecencio Mc, MD Primary Cardiologist: No primary care provider on file.  Primary Electrophysiologist: New to None  Reason for Consultation: Cryptogenic stroke; recommendations regarding Implantable Loop Recorder  History of Present  EP has been asked to evaluate Lisa Green for placement of an implantable loop recorder to monitor for atrial fibrillation by Dr Erlinda Hong.  The patient was admitted on 11/16/2018 with left facial droop and slurred speech. They first developed symptoms while at home with sudden onset. Given IV TPA.  Imaging demonstrated right MCA frontal cortex CVA in embolic pattern.  she has undergone workup for stroke including echocardiogram. Carotids widely patent by CTA  The patient has been monitored on telemetry which has demonstrated sinus rhythm with no arrhythmias.  Inpatient stroke work-up is to be completed with a TEE.  She has a remote history of DVT in the setting of pregnancy. LE DVT US negative this admission. Previously tolerated coumadin without difficulty.   Echocardiogram this admission demonstrated EF 60-65%, normal LA, and no gross valvular abnormalities. Lab work is reviewed.  Prior to admission, the patient denies chest pain, shortness of breath, dizziness, palpitations, or syncope.  They are recovering from their stroke with plans to go home at discharge.  Past Medical History:  Diagnosis Date   Diverticulosis 2013   colonoscopy, [place]   Hyperlipidemia    Obesity (BMI 30.0-34.9)      Surgical History:  Past Surgical History:  Procedure Laterality Date   ABDOMINAL HYSTERECTOMY     with wedge resection of right ovary , normal path   [place]     ENDOVENOUS ABLATION SAPHENOUS VEIN W/ LASER  2008   for  varicose veins,  [place], [place]     Medications Prior to Admission  Medication Sig Dispense Refill Last Dose   rosuvastatin (CRESTOR) 10 MG tablet TAKE 1 TABLET (10 MG TOTAL) DAILY BY MOUTH. 90 tablet 1    telmisartan (MICARDIS) 20 MG tablet Take 1 tablet (20 mg total) by mouth daily. 30 tablet 5     Inpatient Medications:   aspirin EC  325 mg Oral Daily   pantoprazole  40 mg Oral Daily   rosuvastatin  20 mg Oral Daily    Allergies:  Allergies  Allergen Reactions   Wasp Venom Protein Anaphylaxis   Insect Extract Allergy Skin Test     Insect Stings    Social History   Socioeconomic History   Marital status: Married    Spouse name: Not on file   Number of children: Not on file   Years of education: Not on file   Highest education level: Not on file  Occupational History   Occupation: Production assistant, radio    Comment: OT at [employer] resource strain: Not on file   Food insecurity    Worry: Not on file    Inability: Not on file   Transportation needs    Medical: Not on file    Non-medical: Not on file  Tobacco Use   Smoking status: Never Smoker   Smokeless tobacco: Never Used  Substance and Sexual Activity   Alcohol use: Yes    Comment: 2 oz red wine daily   Drug use: No   Sexual activity: Not  on file  Lifestyle   Physical activity    Days per week: Not on file    Minutes per session: Not on file   Stress: Not on file  Relationships   Social connections    Talks on phone: Not on file    Gets together: Not on file    Attends religious service: Not on file    Active member of club or organization: Not on file    Attends meetings of clubs or organizations: Not on file    Relationship status: Not on file   Intimate partner violence    Fear of current or ex partner: Not on file    Emotionally abused: Not on file    Physically abused: Not on file    Forced sexual activity: Not on file  Other Topics Concern   Not  on file  Social History Narrative   Not on file     Family History  Problem Relation Age of Onset   Coronary artery disease Father 74   Stroke Father    Coronary artery disease Paternal Uncle    Heart disease Paternal Uncle    Coronary artery disease Paternal Grandmother    Heart disease Paternal Grandmother    Coronary artery disease Paternal Grandfather    Heart disease Paternal Grandfather    Heart disease Mother    Cancer Sister 108       ovarian ca   Heart disease Brother    Breast cancer Neg Hx       Review of Systems: All other systems reviewed and are otherwise negative except as noted above.  Physical Exam: Vitals:   11/17/18 2200 11/17/18 2300 11/18/18 0000 11/18/18 0400  BP:   (!) 150/108 129/84  Pulse: (!) 58 (!) 58 69 (!) 57  Resp: (!) 21     Temp:   98.6 F (37 C) 98.3 F (36.8 C)  TempSrc:   Oral Oral  SpO2:   98%   Weight:      Height:        GEN- The patient is well appearing, alert and oriented x 3 today.   Head- normocephalic, atraumatic Eyes-  Sclera clear, conjunctiva pink Ears- hearing intact Oropharynx- clear Neck- supple Lungs- Clear to ausculation bilaterally, normal work of breathing Heart- Regular rate and rhythm, no murmurs, rubs or gallops  GI- soft, NT, ND, + BS Extremities- no clubbing, cyanosis, or edema MS- no significant deformity or atrophy Skin- no rash or lesion Psych- euthymic mood, full affect   Labs:   Lab Results  Component Value Date   WBC 6.6 11/18/2018   HGB 13.5 11/18/2018   HCT 40.1 11/18/2018   MCV 93.7 11/18/2018   PLT 211 11/18/2018    Recent Labs  Lab 11/16/18 1003  11/18/18 0522  NA 140   < > 141  K 4.0   < > 4.0  CL 106   < > 110  CO2 23   < > 23  BUN 13   < > 11  CREATININE 0.69   < > 0.73  CALCIUM 9.5   < > 9.2  PROT 7.7  --   --   BILITOT 0.8  --   --   ALKPHOS 79  --   --   ALT 38  --   --   AST 27  --   --   GLUCOSE 112*   < > 95   < > = values in this interval not  displayed.     Radiology/Studies: Ct Angio Head W Or Wo Contrast  Result Date: 11/16/2018 CLINICAL DATA:  Code stroke. Slurred speech. Patient received tPA in emergency room EXAM: CT ANGIOGRAPHY HEAD AND NECK TECHNIQUE: Multidetector CT imaging of the head and neck was performed using the standard protocol during bolus administration of intravenous contrast. Multiplanar CT image reconstructions and MIPs were obtained to evaluate the vascular anatomy. Carotid stenosis measurements (when applicable) are obtained utilizing NASCET criteria, using the distal internal carotid diameter as the denominator. CONTRAST:  29mL OMNIPAQUE IOHEXOL 350 MG/ML SOLN COMPARISON:  CT head 11/16/2018 FINDINGS: CTA NECK FINDINGS Aortic arch: Standard branching. Imaged portion shows no evidence of aneurysm or dissection. No significant stenosis of the major arch vessel origins. Right carotid system: Right carotid system widely patent without stenosis or atherosclerotic disease Left carotid system: Left carotid system widely patent without stenosis or atherosclerotic disease. Vertebral arteries: Both vertebral arteries widely patent to the basilar without stenosis. Skeleton: No acute abnormality Other neck: Negative for mass or adenopathy in the neck. Upper chest: Lung apices clear bilaterally. Review of the MIP images confirms the above findings CTA HEAD FINDINGS Anterior circulation: Left cavernous carotid widely patent. Left anterior and middle cerebral arteries widely patent. Previous CT suggested possible hyperdense left internal carotid artery and left MCA. Interval tPA. Vessels widely patent currently. No branch occlusion. Right cavernous carotid widely patent. Right anterior and middle cerebral arteries widely patent bilaterally. Posterior circulation: Both vertebral arteries patent to the basilar. PICA patent bilaterally. Basilar widely patent. Superior cerebellar and posterior cerebral arteries widely patent bilaterally.  Venous sinuses: Patent Anatomic variants: None Delayed phase: Not perform Review of the MIP images confirms the above findings IMPRESSION: 1. Normal CTA head neck. No large vessel occlusion or stenosis. Possible hyperdense left MCA on prior CT may have recanalized following tPA. No branch occlusion 2. No carotid or vertebral artery stenosis in the neck 3. These results were called by telephone at the time of interpretation on 11/16/2018 at 10:59 am to Dr. Neville Route , who verbally acknowledged these results. Electronically Signed   By: Franchot Gallo M.D.   On: 11/16/2018 11:01   Ct Angio Neck W And/or Wo Contrast  Result Date: 11/16/2018 CLINICAL DATA:  Code stroke. Slurred speech. Patient received tPA in emergency room EXAM: CT ANGIOGRAPHY HEAD AND NECK TECHNIQUE: Multidetector CT imaging of the head and neck was performed using the standard protocol during bolus administration of intravenous contrast. Multiplanar CT image reconstructions and MIPs were obtained to evaluate the vascular anatomy. Carotid stenosis measurements (when applicable) are obtained utilizing NASCET criteria, using the distal internal carotid diameter as the denominator. CONTRAST:  76mL OMNIPAQUE IOHEXOL 350 MG/ML SOLN COMPARISON:  CT head 11/16/2018 FINDINGS: CTA NECK FINDINGS Aortic arch: Standard branching. Imaged portion shows no evidence of aneurysm or dissection. No significant stenosis of the major arch vessel origins. Right carotid system: Right carotid system widely patent without stenosis or atherosclerotic disease Left carotid system: Left carotid system widely patent without stenosis or atherosclerotic disease. Vertebral arteries: Both vertebral arteries widely patent to the basilar without stenosis. Skeleton: No acute abnormality Other neck: Negative for mass or adenopathy in the neck. Upper chest: Lung apices clear bilaterally. Review of the MIP images confirms the above findings CTA HEAD FINDINGS Anterior circulation:  Left cavernous carotid widely patent. Left anterior and middle cerebral arteries widely patent. Previous CT suggested possible hyperdense left internal carotid artery and left MCA. Interval tPA. Vessels widely patent currently. No branch  occlusion. Right cavernous carotid widely patent. Right anterior and middle cerebral arteries widely patent bilaterally. Posterior circulation: Both vertebral arteries patent to the basilar. PICA patent bilaterally. Basilar widely patent. Superior cerebellar and posterior cerebral arteries widely patent bilaterally. Venous sinuses: Patent Anatomic variants: None Delayed phase: Not perform Review of the MIP images confirms the above findings IMPRESSION: 1. Normal CTA head neck. No large vessel occlusion or stenosis. Possible hyperdense left MCA on prior CT may have recanalized following tPA. No branch occlusion 2. No carotid or vertebral artery stenosis in the neck 3. These results were called by telephone at the time of interpretation on 11/16/2018 at 10:59 am to Dr. Neville Route , who verbally acknowledged these results. Electronically Signed   By: Franchot Gallo M.D.   On: 11/16/2018 11:01   Mr Brain Wo Contrast  Result Date: 11/17/2018 CLINICAL DATA:  Stroke treated with tPA. EXAM: MRI HEAD WITHOUT CONTRAST TECHNIQUE: Multiplanar, multiecho pulse sequences of the brain and surrounding structures were obtained without intravenous contrast. COMPARISON:  CT studies done yesterday. FINDINGS: Brain: Diffusion imaging shows acute cortical and subcortical infarction of the right posterior frontal operculum ir region. Region of involvement measures about 3 cm in size. Evidence of hemorrhage or mass effect. No other acute insult is evident. Elsewhere, brainstem and cerebellum are normal. Cerebral hemispheres are normal without evidence of pre-existing insult. No mass lesion, hydrocephalus or extra-axial collection. Vascular: Major vessels at the base of the brain show flow. Skull and  upper cervical spine: Negative Sinuses/Orbits: Clear/normal Other: None IMPRESSION: 3 cm region of acute infarction affecting the right posterior frontal operculum ir region. Mild swelling but no hemorrhage or mass effect. The brain is otherwise normal by MRI. Electronically Signed   By: Nelson Chimes M.D.   On: 11/17/2018 11:41   Ct Head Code Stroke Wo Contrast  Result Date: 11/16/2018 CLINICAL DATA:  Code stroke. Slurred speech. Right facial droop 9 a.m. today. EXAM: CT HEAD WITHOUT CONTRAST TECHNIQUE: Contiguous axial images were obtained from the base of the skull through the vertex without intravenous contrast. COMPARISON:  None. FINDINGS: Brain: Ventricle size normal. Negative for acute hemorrhage. Scattered mild white matter changes most likely due to ischemia indeterminate age. No acute cortical infarction. Negative for mass or edema. Vascular: Possible hyperdense left supraclinoid internal carotid artery and proximal left middle cerebral artery. Skull: Negative Sinuses/Orbits: Negative Other: None ASPECTS ([place] Stroke Program Early CT Score) - Ganglionic level infarction (caudate, lentiform nuclei, internal capsule, insula, M1-M3 cortex): 7 - Supraganglionic infarction (M4-M6 cortex): 3 Total score (0-10 with 10 being normal): 10 IMPRESSION: 1. Possible hyperdense left supraclinoid internal carotid artery and left MCA suspicious for acute thrombosis. CTA head and neck and CT perfusion brain recommended. 2. No acute ischemic infarct.  No intracranial hemorrhage 3. ASPECTS is 10 4. These results were called by telephone at the time of interpretation on 11/16/2018 at 10:08 am to Dr. Arta Silence , who verbally acknowledged these results. Electronically Signed   By: Franchot Gallo M.D.   On: 11/16/2018 10:09   Vas [place] Lower Extremity Venous (dvt)  Result Date: 11/17/2018  Lower Venous Study Indications: Stroke.  Performing Technologist: Maudry Mayhew MHA, RDMS, RVT, RDCS  Examination  Guidelines: A complete evaluation includes B-mode imaging, spectral Doppler, color Doppler, and power Doppler as needed of all accessible portions of each vessel. Bilateral testing is considered an integral part of a complete examination. Limited examinations for reoccurring indications may be performed as noted.  +---------+---------------+---------+-----------+----------+-------+  RIGHT  Compressibility Phasicity Spontaneity Properties Summary  +---------+---------------+---------+-----------+----------+-------+  CFV       Full            Yes       Yes                             +---------+---------------+---------+-----------+----------+-------+  SFJ       Full                                                      +---------+---------------+---------+-----------+----------+-------+  FV Prox   Full                                                      +---------+---------------+---------+-----------+----------+-------+  FV Mid    Full                                                      +---------+---------------+---------+-----------+----------+-------+  FV Distal Full                                                      +---------+---------------+---------+-----------+----------+-------+  PFV       Full                                                      +---------+---------------+---------+-----------+----------+-------+  POP       Full            Yes       Yes                             +---------+---------------+---------+-----------+----------+-------+  PTV       Full                                                      +---------+---------------+---------+-----------+----------+-------+  PERO      Full                                                      +---------+---------------+---------+-----------+----------+-------+   +---------+---------------+---------+-----------+----------+-------+  LEFT      Compressibility Phasicity Spontaneity Properties Summary   +---------+---------------+---------+-----------+----------+-------+  CFV       Full            Yes       Yes                             +---------+---------------+---------+-----------+----------+-------+  SFJ       Full                                                      +---------+---------------+---------+-----------+----------+-------+  FV Prox   Full                                                      +---------+---------------+---------+-----------+----------+-------+  FV Mid    Full                                                      +---------+---------------+---------+-----------+----------+-------+  FV Distal Full                                                      +---------+---------------+---------+-----------+----------+-------+  PFV       Full                                                      +---------+---------------+---------+-----------+----------+-------+  POP       Full            Yes       Yes                             +---------+---------------+---------+-----------+----------+-------+  PTV       Full                                                      +---------+---------------+---------+-----------+----------+-------+  PERO      Full                                                      +---------+---------------+---------+-----------+----------+-------+     Summary: Right: There is no evidence of deep vein thrombosis in the lower extremity. No cystic structure found in the popliteal fossa. Left: There is no evidence of deep vein thrombosis in the lower extremity. No cystic structure found in the popliteal fossa.  *See table(s) above for measurements and observations. Electronically signed by Servando Snare MD on 11/17/2018 at 4:17:54 PM.    Final     12-lead ECGs show normal sinus rhythm (personally reviewed) All prior EKGs in EPIC reviewed with no documented atrial fibrillation  Telemetry NSR (personally reviewed)  Assessment and Plan:  1. Cryptogenic stroke The patient  presents with cryptogenic stroke.  The patient has a TEE planned for this PM.  I spoke at length with the patient about monitoring for afib with an implantable loop recorder.  Risks, benefits, and alteratives to implantable loop recorder were discussed with the patient today.   At this time, the patient is very clear in their decision to proceed with implantable loop recorder.  Wound care was reviewed with the patient (keep incision clean and dry for 3 days).  Wound check scheduled and entered in AVS.  Please call with questions.  Shirley Friar, PA-C 11/18/2018 7:19 AM  I have seen, examined the patient, and reviewed the above assessment and plan.  Changes to above are made where necessary.  On exam, RRR.  I agree with Dr Erlinda Hong that loop recorder is indicated to further evaluate for afib as the cause of her stroke.  Risks and benefits to ILR discussed with the patient who wishes to proceed.  Co Sign: Thompson Grayer, MD 11/18/2018 11:32 AM

## 2018-11-18 NOTE — Discharge Summary (Signed)
Stroke Discharge Summary  Patient ID: Lisa Green   MRN: [id number]      DOB: 1950-10-15  Date of Admission: 11/16/2018 Date of Discharge: 11/18/2018  Attending Physician:  Rosalin Hawking, MD, Stroke MD Consultant(s):     Thompson Grayer, MD (electrophysiology)  Patient's PCP:  Crecencio Mc, MD  DISCHARGE DIAGNOSIS:  Principal Problem:   Acute ischemic stroke ([place]) - R MCA cortical, cryptogenic, s/p tPA Active Problems:   Hyperlipidemia LDL goal <70   Obesity (BMI 30.0-34.9)   Hypertension   Past Medical History:  Diagnosis Date  . Diverticulosis 2013   colonoscopy, [place]  . Hyperlipidemia   . Obesity (BMI 30.0-34.9)    Past Surgical History:  Procedure Laterality Date  . ABDOMINAL HYSTERECTOMY     with wedge resection of right ovary , normal path  . [place]  . CESAREAN SECTION  1983  . COSMETIC SURGERY    . ENDOVENOUS ABLATION SAPHENOUS VEIN W/ LASER  2008   for varicose veins,  [place], [place]    Allergies as of 11/18/2018      Reactions   Wasp Venom Protein Anaphylaxis   Insect Extract Allergy Skin Test    Insect Stings      Medication List    TAKE these medications   aspirin 81 MG EC tablet Take 1 tablet (81 mg total) by mouth daily. Start taking on: November 19, 2018   clopidogrel 75 MG tablet Commonly known as: PLAVIX Take 1 tablet (75 mg total) by mouth daily. Take aspirin and plavix together x 21 days then stop plavix. Continue aspirin. Start taking on: November 19, 2018   rosuvastatin 20 MG tablet Commonly known as: CRESTOR Take 1 tablet (20 mg total) by mouth daily. What changed:   medication strength  how much to take   telmisartan 20 MG tablet Commonly known as: MICARDIS Take 1 tablet (20 mg total) by mouth daily.       LABORATORY STUDIES CBC    Component Value Date/Time   WBC 6.6 11/18/2018 0522   RBC 4.28 11/18/2018 0522   HGB 13.5 11/18/2018 0522   HCT 40.1 11/18/2018 0522   PLT 211 11/18/2018 0522    MCV 93.7 11/18/2018 0522   MCH 31.5 11/18/2018 0522   MCHC 33.7 11/18/2018 0522   RDW 12.8 11/18/2018 0522   LYMPHSABS 2.0 11/16/2018 1003   MONOABS 0.4 11/16/2018 1003   EOSABS 0.1 11/16/2018 1003   BASOSABS 0.0 11/16/2018 1003   CMP    Component Value Date/Time   NA 141 11/18/2018 0522   NA 142 03/30/2017   K 4.0 11/18/2018 0522   CL 110 11/18/2018 0522   CO2 23 11/18/2018 0522   GLUCOSE 95 11/18/2018 0522   BUN 11 11/18/2018 0522   BUN 15 03/30/2017   CREATININE 0.73 11/18/2018 0522   CALCIUM 9.2 11/18/2018 0522   PROT 7.7 11/16/2018 1003   ALBUMIN 4.5 11/16/2018 1003   AST 27 11/16/2018 1003   ALT 38 11/16/2018 1003   ALKPHOS 79 11/16/2018 1003   BILITOT 0.8 11/16/2018 1003   GFRNONAA >60 11/18/2018 0522   GFRAA >60 11/18/2018 0522   COAGSNo results found for: INR, PROTIME Lipid Panel    Component Value Date/Time   CHOL 219 (H) 11/17/2018 0459   TRIG 234 (H) 11/17/2018 0459   HDL 40 (L) 11/17/2018 0459   CHOLHDL 5.5 11/17/2018 0459   VLDL 47 (H) 11/17/2018 0459  LDLCALC 132 (H) 11/17/2018 0459   HgbA1C  Lab Results  Component Value Date   HGBA1C 5.4 11/17/2018   Urinalysis    Component Value Date/Time   BILIRUBINUR Negative 06/16/2016 0846   PROTEINUR 1+ 06/16/2016 0846   UROBILINOGEN 0.2 06/16/2016 0846   NITRITE Negative 06/16/2016 0846   LEUKOCYTESUR small (1+) (A) 06/16/2016 0846   Urine Drug Screen No results found for: LABOPIA, COCAINSCRNUR, LABBENZ, AMPHETMU, THCU, LABBARB  Alcohol Level No results found for: [facility]   SIGNIFICANT DIAGNOSTIC STUDIES Ct Angio Head W Or Wo Contrast  Result Date: 11/16/2018 CLINICAL DATA:  Code stroke. Slurred speech. Patient received tPA in emergency room EXAM: CT ANGIOGRAPHY HEAD AND NECK TECHNIQUE: Multidetector CT imaging of the head and neck was performed using the standard protocol during bolus administration of intravenous contrast. Multiplanar CT image reconstructions and MIPs were obtained to evaluate the  vascular anatomy. Carotid stenosis measurements (when applicable) are obtained utilizing NASCET criteria, using the distal internal carotid diameter as the denominator. CONTRAST:  29mL OMNIPAQUE IOHEXOL 350 MG/ML SOLN COMPARISON:  CT head 11/16/2018 FINDINGS: CTA NECK FINDINGS Aortic arch: Standard branching. Imaged portion shows no evidence of aneurysm or dissection. No significant stenosis of the major arch vessel origins. Right carotid system: Right carotid system widely patent without stenosis or atherosclerotic disease Left carotid system: Left carotid system widely patent without stenosis or atherosclerotic disease. Vertebral arteries: Both vertebral arteries widely patent to the basilar without stenosis. Skeleton: No acute abnormality Other neck: Negative for mass or adenopathy in the neck. Upper chest: Lung apices clear bilaterally. Review of the MIP images confirms the above findings CTA HEAD FINDINGS Anterior circulation: Left cavernous carotid widely patent. Left anterior and middle cerebral arteries widely patent. Previous CT suggested possible hyperdense left internal carotid artery and left MCA. Interval tPA. Vessels widely patent currently. No branch occlusion. Right cavernous carotid widely patent. Right anterior and middle cerebral arteries widely patent bilaterally. Posterior circulation: Both vertebral arteries patent to the basilar. PICA patent bilaterally. Basilar widely patent. Superior cerebellar and posterior cerebral arteries widely patent bilaterally. Venous sinuses: Patent Anatomic variants: None Delayed phase: Not perform Review of the MIP images confirms the above findings IMPRESSION: 1. Normal CTA head neck. No large vessel occlusion or stenosis. Possible hyperdense left MCA on prior CT may have recanalized following tPA. No branch occlusion 2. No carotid or vertebral artery stenosis in the neck 3. These results were called by telephone at the time of interpretation on 11/16/2018 at  10:59 am to Dr. Neville Route , who verbally acknowledged these results. Electronically Signed   By: Franchot Gallo M.D.   On: 11/16/2018 11:01   Ct Angio Neck W And/or Wo Contrast  Result Date: 11/16/2018 CLINICAL DATA:  Code stroke. Slurred speech. Patient received tPA in emergency room EXAM: CT ANGIOGRAPHY HEAD AND NECK TECHNIQUE: Multidetector CT imaging of the head and neck was performed using the standard protocol during bolus administration of intravenous contrast. Multiplanar CT image reconstructions and MIPs were obtained to evaluate the vascular anatomy. Carotid stenosis measurements (when applicable) are obtained utilizing NASCET criteria, using the distal internal carotid diameter as the denominator. CONTRAST:  38mL OMNIPAQUE IOHEXOL 350 MG/ML SOLN COMPARISON:  CT head 11/16/2018 FINDINGS: CTA NECK FINDINGS Aortic arch: Standard branching. Imaged portion shows no evidence of aneurysm or dissection. No significant stenosis of the major arch vessel origins. Right carotid system: Right carotid system widely patent without stenosis or atherosclerotic disease Left carotid system: Left carotid system widely patent without  stenosis or atherosclerotic disease. Vertebral arteries: Both vertebral arteries widely patent to the basilar without stenosis. Skeleton: No acute abnormality Other neck: Negative for mass or adenopathy in the neck. Upper chest: Lung apices clear bilaterally. Review of the MIP images confirms the above findings CTA HEAD FINDINGS Anterior circulation: Left cavernous carotid widely patent. Left anterior and middle cerebral arteries widely patent. Previous CT suggested possible hyperdense left internal carotid artery and left MCA. Interval tPA. Vessels widely patent currently. No branch occlusion. Right cavernous carotid widely patent. Right anterior and middle cerebral arteries widely patent bilaterally. Posterior circulation: Both vertebral arteries patent to the basilar. PICA patent  bilaterally. Basilar widely patent. Superior cerebellar and posterior cerebral arteries widely patent bilaterally. Venous sinuses: Patent Anatomic variants: None Delayed phase: Not perform Review of the MIP images confirms the above findings IMPRESSION: 1. Normal CTA head neck. No large vessel occlusion or stenosis. Possible hyperdense left MCA on prior CT may have recanalized following tPA. No branch occlusion 2. No carotid or vertebral artery stenosis in the neck 3. These results were called by telephone at the time of interpretation on 11/16/2018 at 10:59 am to Dr. Neville Route , who verbally acknowledged these results. Electronically Signed   By: Franchot Gallo M.D.   On: 11/16/2018 11:01   Mr Brain Wo Contrast  Result Date: 11/17/2018 CLINICAL DATA:  Stroke treated with tPA. EXAM: MRI HEAD WITHOUT CONTRAST TECHNIQUE: Multiplanar, multiecho pulse sequences of the brain and surrounding structures were obtained without intravenous contrast. COMPARISON:  CT studies done yesterday. FINDINGS: Brain: Diffusion imaging shows acute cortical and subcortical infarction of the right posterior frontal operculum ir region. Region of involvement measures about 3 cm in size. Evidence of hemorrhage or mass effect. No other acute insult is evident. Elsewhere, brainstem and cerebellum are normal. Cerebral hemispheres are normal without evidence of pre-existing insult. No mass lesion, hydrocephalus or extra-axial collection. Vascular: Major vessels at the base of the brain show flow. Skull and upper cervical spine: Negative Sinuses/Orbits: Clear/normal Other: None IMPRESSION: 3 cm region of acute infarction affecting the right posterior frontal operculum ir region. Mild swelling but no hemorrhage or mass effect. The brain is otherwise normal by MRI. Electronically Signed   By: Nelson Chimes M.D.   On: 11/17/2018 11:41   Ct Head Code Stroke Wo Contrast  Result Date: 11/16/2018 CLINICAL DATA:  Code stroke. Slurred speech.  Right facial droop 9 a.m. today. EXAM: CT HEAD WITHOUT CONTRAST TECHNIQUE: Contiguous axial images were obtained from the base of the skull through the vertex without intravenous contrast. COMPARISON:  None. FINDINGS: Brain: Ventricle size normal. Negative for acute hemorrhage. Scattered mild white matter changes most likely due to ischemia indeterminate age. No acute cortical infarction. Negative for mass or edema. Vascular: Possible hyperdense left supraclinoid internal carotid artery and proximal left middle cerebral artery. Skull: Negative Sinuses/Orbits: Negative Other: None ASPECTS ([place] Stroke Program Early CT Score) - Ganglionic level infarction (caudate, lentiform nuclei, internal capsule, insula, M1-M3 cortex): 7 - Supraganglionic infarction (M4-M6 cortex): 3 Total score (0-10 with 10 being normal): 10 IMPRESSION: 1. Possible hyperdense left supraclinoid internal carotid artery and left MCA suspicious for acute thrombosis. CTA head and neck and CT perfusion brain recommended. 2. No acute ischemic infarct.  No intracranial hemorrhage 3. ASPECTS is 10 4. These results were called by telephone at the time of interpretation on 11/16/2018 at 10:08 am to Dr. Arta Silence , who verbally acknowledged these results. Electronically Signed   By: Franchot Gallo M.D.  On: 11/16/2018 10:09   Vas [place] Lower Extremity Venous (dvt)  Result Date: 11/17/2018  Lower Venous Study Indications: Stroke.  Performing Technologist: Maudry Mayhew MHA, RDMS, RVT, RDCS  Examination Guidelines: A complete evaluation includes B-mode imaging, spectral Doppler, color Doppler, and power Doppler as needed of all accessible portions of each vessel. Bilateral testing is considered an integral part of a complete examination. Limited examinations for reoccurring indications may be performed as noted.  +---------+---------------+---------+-----------+----------+-------+ RIGHT     CompressibilityPhasicitySpontaneityPropertiesSummary +---------+---------------+---------+-----------+----------+-------+ CFV      Full           Yes      Yes                          +---------+---------------+---------+-----------+----------+-------+ SFJ      Full                                                 +---------+---------------+---------+-----------+----------+-------+ FV Prox  Full                                                 +---------+---------------+---------+-----------+----------+-------+ FV Mid   Full                                                 +---------+---------------+---------+-----------+----------+-------+ FV DistalFull                                                 +---------+---------------+---------+-----------+----------+-------+ PFV      Full                                                 +---------+---------------+---------+-----------+----------+-------+ POP      Full           Yes      Yes                          +---------+---------------+---------+-----------+----------+-------+ PTV      Full                                                 +---------+---------------+---------+-----------+----------+-------+ PERO     Full                                                 +---------+---------------+---------+-----------+----------+-------+   +---------+---------------+---------+-----------+----------+-------+ LEFT     CompressibilityPhasicitySpontaneityPropertiesSummary +---------+---------------+---------+-----------+----------+-------+ CFV      Full           Yes      Yes                          +---------+---------------+---------+-----------+----------+-------+  SFJ      Full                                                 +---------+---------------+---------+-----------+----------+-------+ FV Prox  Full                                                  +---------+---------------+---------+-----------+----------+-------+ FV Mid   Full                                                 +---------+---------------+---------+-----------+----------+-------+ FV DistalFull                                                 +---------+---------------+---------+-----------+----------+-------+ PFV      Full                                                 +---------+---------------+---------+-----------+----------+-------+ POP      Full           Yes      Yes                          +---------+---------------+---------+-----------+----------+-------+ PTV      Full                                                 +---------+---------------+---------+-----------+----------+-------+ PERO     Full                                                 +---------+---------------+---------+-----------+----------+-------+     Summary: Right: There is no evidence of deep vein thrombosis in the lower extremity. No cystic structure found in the popliteal fossa. Left: There is no evidence of deep vein thrombosis in the lower extremity. No cystic structure found in the popliteal fossa.  *See table(s) above for measurements and observations. Electronically signed by Servando Snare MD on 11/17/2018 at 4:17:54 PM.    Final     2D Echocardiogram 11/17/2018  1. The left ventricle has normal systolic function with an ejection fraction of 60-65%. The cavity size was normal. There is mildly increased left ventricular wall thickness. Left ventricular diastolic Doppler parameters are consistent with impaired  relaxation.  2. The right ventricle has normal systolic function. The cavity was normal. There is no increase in right ventricular wall thickness.  3. No intracardiac thrombi or masses were visualized.  TEE 11/18/2018 1. No LAA thrombus 2. Negative for PFO 3. Trivial AI 4. LVEF 60-65%    HISTORY  OF PRESENT ILLNESS SUMAN TRIVEDI is a 68 y.o. female  past medical history of hyperlipidemia, obesity, who was in her usual state of health at around 9 AM.  She woke up normal this morning and had a sudden onset of left facial droop and slurred speech around 9 AM on 11/16/2018 (LKW). Presented at [facility], evaluated by in-house neurologist there and given IV TPA for stroke. Concern for possible LVO based on exam and or imaging studies by [place] provider and transferred to [facility] after giving TPA for possible intervention. On arrival exam consistent with a small vessel stroke.  Imaging reviewed with neuroradiology and interventional neuroradiology and no identifiable occlusion to intervene on.  Premorbid modified Rankin scale (mRS): 0. Per report- NIH 4 (dysarthria, right facial droop and drift on right upper and left upper extremity)-appears inconsistent because patient actually has a left facial droop on exam. Admitted to ICU for further work-up.  HOSPITAL COURSE Ms. RONDI IVY is a 68 y.o. female with history of hypertension and obesity, hyperlipidemia admitted for left facial droop and slurred speech. tPA given at [facility].    Stroke:  right MCA frontal cortex infarct embolic pattern secondary to unknown source  Resultant left facial droop and slurred speech  MRI right MCA frontal cortex infarct  CTA head and neck unremarkable  2D Echo EF 60-65%. No source of embolus   LE venous Doppler neg DVT  TEE unremarkable, no PFO  Loop recorder placed 11/18/18 to rule out cardioembolic source  LDL 932  HgbA1c 5.4  No antithrombotic prior to admission, ow on aspirin 325 mg daily. Will do DAPT x 3 weeks then aspirin alone.  Therapy recommendations: no therapy needs  Disposition: return home  History of DVT  At the time of her pregnancy many years ago  Was treated with Coumadin  No recurrence  GYN deemed not candidate for hormonal replacement therapy given history of DVT  LE venous Doppler neg  DVT  Hypertension  Stable  Home medication - telmisartan  BP goal normotensive  Hyperlipidemia  Home meds: Crestor 10  LDL 132, goal < 70  Now on Crestor 20  Continue statin at discharge  Other Stroke Risk Factors  Advanced age  Obesity, Body mass index is 34.02 kg/m.   Other Active Problems  Elevated TG   DISCHARGE EXAM Blood pressure (!) 167/97, pulse 63, temperature 98.2 F (36.8 C), temperature source Oral, resp. rate 17, height 5\' 6"  (1.676 m), weight 95.6 kg, SpO2 100 %. General - Well nourished, well developed, in no apparent distress.  Ophthalmologic - fundi not visualized due to noncooperation.  Cardiovascular - Regular rate and rhythm.  Mental Status -  Level of arousal and orientation to time, place, and person were intact. Language including expression, naming, repetition, comprehension was assessed and found intact. Attention span and concentration were normal. Recent and remote memory were intact. Fund of Knowledge was assessed and was intact.  Cranial Nerves II - XII - II - Visual field intact OU. III, IV, VI - Extraocular movements intact. V - Facial sensation intact bilaterally. VII - mild left facial droop. VIII - Hearing & vestibular intact bilaterally. X - Palate elevates symmetrically, mild dysarthria. XI - Chin turning & shoulder shrug intact bilaterally. XII - Tongue protrusion midline.  Motor Strength - The patient's strength was normal in all extremities and pronator drift was absent.  Bulk was normal and fasciculations were absent.   Motor Tone - Muscle tone was assessed  at the neck and appendages and was normal.  Reflexes - The patient's reflexes were symmetrical in all extremities and she had no pathological reflexes.  Sensory - Light touch, temperature/pinprick were assessed and were symmetrical.    Coordination - The patient had normal movements in the hands and feet with no ataxia or dysmetria.  Tremor was  absent.  Gait and Station - deferred.  Discharge Diet   Heart healthy thin liquids  DISCHARGE PLAN  Disposition:  Return home  aspirin 81 mg daily and clopidogrel 75 mg daily for secondary stroke prevention for 3 weeks then aspirin alone.  Ongoing stroke risk factor control by Primary Care Physician at time of discharge  Follow-up Crecencio Mc, MD in 2 weeks.  Follow-up in [facility] in 4 weeks, office to schedule an appointment.   35 minutes were spent preparing discharge.  Rosalin Hawking, MD PhD Stroke Neurology 11/18/2018 2:25 PM

## 2018-11-18 NOTE — H&P (Signed)
   INTERVAL PROCEDURE H&P  History and Physical Interval Note:  11/18/2018 11:20 AM  Lisa Green has presented today for their planned procedure. The various methods of treatment have been discussed with the patient and family. After consideration of risks, benefits and other options for treatment, the patient has consented to the procedure.  The patients' outpatient history has been reviewed, patient examined, and no change in status from most recent office note within the past 30 days. I have reviewed the patients' chart and labs and will proceed as planned. Questions were answered to the patient's satisfaction.   Lisa Casino, MD, [facility], [place] Director of the Advanced Lipid Disorders &  Cardiovascular Risk Reduction Clinic Diplomate of the American Board of Clinical Lipidology Attending Cardiologist  Direct Dial: [phone]  Fax: [phone]  Website:  [email or web address] Lisa Green 11/18/2018, 11:20 AM

## 2018-11-20 ENCOUNTER — Encounter (HOSPITAL_COMMUNITY): Payer: Self-pay | Admitting: Internal Medicine

## 2018-11-21 ENCOUNTER — Other Ambulatory Visit: Payer: Self-pay | Admitting: *Deleted

## 2018-11-21 NOTE — Patient Outreach (Signed)
[facility]) Care Management  11/21/2018  Lisa Green 1950-11-01 [id number]  Transition of care telephone call Referral received: 11/17/18 Initial outreach: 11/21/18 Insurance: [place]   Subjective:  Initial successful telephone call to patient's preferred number in order to complete transition of care assessment; 2 HIPAA identifiers verified. Explained purpose of call. Lisa states a person name Museum/gallery conservator from [employer] called her yesterday 11/20/18 and completed a transition of care call therefore this call was deferred.    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 She was given TPA and then transferred to [facility] on 11/16/18 for further stroke work up. She had a TEE , bubble study and loop recorder insertion on 11/18/18.   She was discharge on 11/19/18 without the need for home health services or durable medical equipment per the discharge summary.  Comorbidities include: HTN, diverticulosis, hepatic steatosis, carpal tunnel syndrome of the right wrist, hyperlipidemia, obesity, and parotid mass.  Plan: This RNCM will check with [place] Management leadership to see if another transition of care call is indicated.   Barrington Ellison RN,CCM,CDE [place] Management Coordinator Office Phone [phone] Office Fax [phone]

## 2018-11-22 ENCOUNTER — Encounter: Payer: Self-pay | Admitting: *Deleted

## 2018-11-22 ENCOUNTER — Telehealth: Payer: Self-pay | Admitting: Internal Medicine

## 2018-11-22 ENCOUNTER — Other Ambulatory Visit: Payer: Self-pay | Admitting: *Deleted

## 2018-11-22 NOTE — Telephone Encounter (Signed)
PT had a stroke on June 17. She is suppose to set up a follow up, and have her BP checked. Pt states she needs to be seen in office. Pt has been checking BP at home and can give Dr. Derrel Nip readings.

## 2018-11-22 NOTE — Patient Outreach (Signed)
[place] [facility]) Care Management  11/22/2018  VEANNA DOWER 1950/07/15 [id number]  Transition of care call/case closure   Referral received: 11/17/18 Initial outreach: 11/21/18 Insurance: [place] [employer]   Subjective: After receiving direction from [place] Management leadership,  successful telephone call to patient's mobile number in order to complete transition of care assessment; 2 HIPAA identifiers verified. Explained purpose of call and completed transition of care assessment.  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  Spouse is assisting with her recovery.    Objective:  Rhya Shan was hospitalized at [facility] on 11/16/18 for symptoms of an acute stroke. She was given TPA and then transferred to [facility] on 11/16/18 for further stroke work up. She had a TEE , bubble study and loop recorder insertion on 11/18/18.   She was discharge on 11/19/18 without the need for home health services or durable medical equipment per the discharge summary.  Comorbidities include: HTN, diverticulosis, hepatic steatosis, carpal tunnel syndrome of the right wrist, hyperlipidemia, obesity, and parotid mass.  Assessment:  Patient voices good understanding of all discharge instructions.  See transition of care flowsheet for assessment details. Per record review (Active Advice View), Tiffiany is participating in the Active Health Management condition management program.   Plan:  Reviewed hospital discharge diagnosis of thrombolic, ischemic stroke and treatment plan using hospital discharge instructions, assessing medication adherence, reviewing problems  requiring provider notification, and discussing the importance of follow up with  specialists as directed. [facility]'s Active Health Management 2020 Wellness Requirements of: Completing the computerized Health Assessment and the Health Action Step with Active Health Management [facility]) by January 31 2019 AND have an annual physical between June 01, 2017 and January 31, 2019 in order to qualify for the 2021 Healthy Lifestyle Premium to [employer] secure e-mail address. No ongoing care management needs identified so will close case to [place] Management services and route successful outreach letter with [place] Management pamphlet and 24 Hour Nurse Line Magnet to [place] Management clinical pool to be mailed to patient's home address.   Barrington Ellison RN,CCM,CDE [place] Management Coordinator Office Phone [phone] Office Fax [phone]

## 2018-11-23 ENCOUNTER — Other Ambulatory Visit: Payer: Self-pay | Admitting: *Deleted

## 2018-11-23 NOTE — Telephone Encounter (Signed)
LMTCB. Please transfer pt to our office.  

## 2018-11-23 NOTE — Telephone Encounter (Signed)
At this point I cannot commit to an office visit in person given the potential exposure unless she waits 2 weeks

## 2018-11-23 NOTE — Patient Outreach (Signed)
[place] [facility] [facility]) Care Management  11/23/2018  FAREEHA EVON March 27, 1951 [id number]  Emmi Red Flag  Emmi: Stroke Day # 3 Referral Date: 11/23/18 Referral Reason: Answered "yes" to "questions/problems with meds" Insurance: [place]  Subjective: Initial successful telephone call to patient's home/mobile number in order to follow up on Stroke Emmi Red Flag; 2 HIPAA identifiers verified. Kaicee states the affirmative response was a mistake and that she has no questions or problems regarding her medications.   Plan:  Will close case to [place] Management services.  Barrington Ellison RN,CCM,CDE [place] Management Coordinator Office Phone [phone] Office Fax [phone]

## 2018-11-23 NOTE — Telephone Encounter (Signed)
Is it okay to schedule pt for an in office appt if screening is passed?

## 2018-11-29 ENCOUNTER — Other Ambulatory Visit: Payer: Self-pay | Admitting: *Deleted

## 2018-11-29 ENCOUNTER — Telehealth: Payer: Self-pay | Admitting: *Deleted

## 2018-11-29 NOTE — Telephone Encounter (Signed)
Spoke with patient to request manual transmission for review of 2 untransmitted "AF" episodes. Assisted with transmission--all ECGs show SR w/PACs, no true AF. Pt verbalizes understanding and is aware of upcoming wound check on 12/01/18.  Will reprogram detection to "less sensitive" and extend AF episode storage to >=6 min at wound check.

## 2018-11-29 NOTE — Patient Outreach (Signed)
[facility] [facility]) Care Management  11/29/2018  Lisa Green 12-11-1950 [id number]   Emmi Red Flag  Emmi: Stroke Day # 9 Referral Date: 11/29/18 Referral Reason: Answered "yes" to "questions/problems with meds?" and "sad, hopeless, anxious or empty?" Insurance: [place]  Subjective: Initial successful telephone call to patient's home/mobile number in order to follow up on Stroke Emmi Red Flag; 2 HIPAA identifiers verified. Augustina states the affirmative responses were recorded by the [facility] program incorrectly and that she has no questions or problems regarding her medications and does not feel sad, hopeless, anxious or angry.   Plan:  Reminded Jahlia about the [place] [facility] offered to [place] Members at no cost should she feel she would benefit from their services in the future.  Will close case to [place] Management services.  Barrington Ellison RN,CCM,CDE [place] Management Coordinator Office Phone [phone] Office Fax [phone]

## 2018-11-30 ENCOUNTER — Ambulatory Visit (INDEPENDENT_AMBULATORY_CARE_PROVIDER_SITE_OTHER): Payer: 59 | Admitting: Internal Medicine

## 2018-11-30 ENCOUNTER — Encounter: Payer: Self-pay | Admitting: Internal Medicine

## 2018-11-30 ENCOUNTER — Telehealth: Payer: Self-pay

## 2018-11-30 DIAGNOSIS — I6930 Unspecified sequelae of cerebral infarction: Secondary | ICD-10-CM

## 2018-11-30 DIAGNOSIS — K760 Fatty (change of) liver, not elsewhere classified: Secondary | ICD-10-CM | POA: Diagnosis not present

## 2018-11-30 DIAGNOSIS — I10 Essential (primary) hypertension: Secondary | ICD-10-CM

## 2018-11-30 DIAGNOSIS — E785 Hyperlipidemia, unspecified: Secondary | ICD-10-CM | POA: Diagnosis not present

## 2018-11-30 MED ORDER — REPATHA SURECLICK 140 MG/ML ~~LOC~~ SOAJ: 140.0000 mg | SUBCUTANEOUS | 11 refills | Status: DC

## 2018-11-30 MED ORDER — TELMISARTAN 40 MG PO TABS: 40.0000 mg | ORAL_TABLET | Freq: Every day | ORAL | 1 refills | Status: DC

## 2018-11-30 NOTE — Telephone Encounter (Signed)
PA for Repatha has been submitted on covernymeds.

## 2018-11-30 NOTE — Assessment & Plan Note (Addendum)
She is not tolerating 20 mg dose of crestor due to leg pain.  Will reduce to 10 mg alt with 20 mg.  And petition for Repatha

## 2018-11-30 NOTE — Patient Instructions (Addendum)
Increase the telmisartan to 40 mg AND TAKE IT AT NIGHT   REDUCE THE CRESTOR DOSE TO 10 MG DAILY    AFTER A FEW DAYS TRY ALTERNATING BETWEEN A 10 MG AND 20 DOSE   There is an alternative to statin therapy for management of hyperlipidemia.  It is called Repatha,  And it has a very low side effect profile because of its mechanism of action.  It is in a class of drugs called a PCSK9 inhibitor.   Repatha is a monoclonal human antibody that binds to the body's natural inhibitor of the LDL receptors (PCSK9) , preventing the inhibitor from inhibiting!  This effectively increases  the number of LDL receptors available to clear the bad cholesterol (LDL)  from the bloodstream.  It is injected into the skin (at home)  twice per month . The data of its efficacy is very good.  If you would be interested in trying it,  I will try to get it authorized    FASTING LABS SHOULD BE DONE IN 6 WEEKS

## 2018-11-30 NOTE — Telephone Encounter (Signed)
Appointment 12-01-2018  LMOVM      COVID-19 Pre-Screening Questions:  . In the past 7 to 10 days have you had a cough,  shortness of breath, headache, congestion, fever (100 or greater) body aches, chills, sore throat, or sudden loss of taste or sense of smell? . Have you been around anyone with known Covid 19. . Have you been around anyone who is awaiting Covid 19 test results in the past 7 to 10 days? . Have you been around anyone who has been exposed to Covid 19, or has mentioned symptoms of Covid 19 within the past 7 to 10 days?  If you have any concerns/questions about symptoms patients report during screening (either on the phone or at threshold). Contact the provider seeing the patient or DOD for further guidance.  If neither are available contact a member of the leadership team.

## 2018-11-30 NOTE — Progress Notes (Signed)
Virtual Visit converted to Telephone  Note  This visit type was conducted due to national recommendations for restrictions regarding the COVID-19 pandemic (e.g. social distancing).  This format is felt to be most appropriate for this patient at this time.  All issues noted in this document were discussed and addressed.  No physical exam was performed (except for noted visual exam findings with Video Visits).   I attempted to connect with@ on November 30 2018 at  9:00 AM EDT by a video enabled telemedicine application ; however  Interactive audio and video telecommunications  Ultimately failed, due to patient having technical difficulties.   We continued and completed visit with audio only. I and verified that I am speaking with the correct person using two identifiers. Location patient: home Location provider: work or home office Persons participating in the virtual visit: patient, provider  I discussed the limitations, risks, security and privacy concerns of performing an evaluation and management service by telephone and the availability of in person appointments. I also discussed with the patient that there may be a patient responsible charge related to this service. The patient expressed understanding and agreed to proceed.  Reason for visit: follow up   HPI:    68 yr old female with history of hypertension , recently admitted to [facility] on Jun 17 with  Left sided facial droop and dysarthria  , was treated with TPA  for acute R  MCA stroke  Received TPA and transferred to [facility] ICU and workup was unrevealing for cause  She was discharged on asa and plavix.  Her facial paresis is still present but improving. She is no longer drooling and can smile ,  Although her smile is asymmetric. Speech is improvng,  occasional word finding   Loop monitor placed jun 19. Possible run of atrial fib was noted but not confirmed.  Sees Neurology July 23. Out of work until neurology follow up  No evidence of OSA by  observation ,  No prior sleep study  Sats  overnight in house were normal.   BP since she has been home is labile .  Received hydralazine in house for sbp > 200.   Daily readings reviewed:  148/93,  133/91  And 152/92 153/95 160/94  Lowest is  128/83.  . Taking 20 mg telmisartan in the am  Not tolerated increased dose of crestor  20 mg dose making legs ache all night     ROS: See pertinent positives and negatives per HPI.  Past Medical History:  Diagnosis Date  . Diverticulosis 2013   colonoscopy, [place]  . Hyperlipidemia   . Obesity (BMI 30.0-34.9)   . Stroke [facility]) 11/17/2018   R MCA cortical, cryptogenic    Past Surgical History:  Procedure Laterality Date  . ABDOMINAL HYSTERECTOMY     with wedge resection of right ovary , normal path  . BUBBLE STUDY  11/18/2018   Procedure: BUBBLE STUDY;  Surgeon: [name], MD;  Location: [facility] ENDOSCOPY;  Service: Cardiovascular;;  . [place]  . CESAREAN SECTION  1983  . COSMETIC SURGERY    . ENDOVENOUS ABLATION SAPHENOUS VEIN W/ LASER  2008   for varicose veins,  [place], [place]  . LOOP RECORDER INSERTION N/A 11/18/2018   Procedure: LOOP RECORDER INSERTION;  Surgeon: Thompson Grayer, MD;  Location: [place] CV LAB;  Service: Cardiovascular;  Laterality: N/A;  . TEE WITHOUT CARDIOVERSION N/A 11/18/2018   Procedure: TRANSESOPHAGEAL ECHOCARDIOGRAM (TEE);  Surgeon:  [name], MD;  Location: [facility] ENDOSCOPY;  Service: Cardiovascular;  Laterality: N/A;    Family History  Problem Relation Age of Onset  . Coronary artery disease Father 50  . Stroke Father   . Coronary artery disease Paternal Uncle   . Heart disease Paternal Uncle   . Coronary artery disease Paternal Grandmother   . Heart disease Paternal Grandmother   . Coronary artery disease Paternal Grandfather   . Heart disease Paternal Grandfather   . Heart disease Mother   . Cancer Sister 27       ovarian ca  . Heart disease Brother   . Breast  cancer Neg Hx     SOCIAL HX:  reports that she has never smoked. She has never used smokeless tobacco. She reports current alcohol use. She reports that she does not use drugs.   Current Outpatient Medications:  .  aspirin EC 81 MG EC tablet, Take 1 tablet (81 mg total) by mouth daily., Disp:  , Rfl:  .  clopidogrel (PLAVIX) 75 MG tablet, Take 1 tablet (75 mg total) by mouth daily. Take aspirin and plavix together x 21 days then stop plavix. Continue aspirin., Disp: 21 tablet, Rfl: 0 .  rosuvastatin (CRESTOR) 20 MG tablet, Take 1 tablet (20 mg total) by mouth daily., Disp: 30 tablet, Rfl: 2 .  telmisartan (MICARDIS) 40 MG tablet, Take 1 tablet (40 mg total) by mouth at bedtime., Disp: 90 tablet, Rfl: 1 .  Evolocumab (REPATHA SURECLICK) 203 MG/ML SOAJ, Inject 140 mg into the skin every 14 (fourteen) days., Disp: 2 mL, Rfl: 11  EXAM:  VITALS per patient if applicable:  GENERAL: alert, oriented, appears well and in no acute distress  HEENT: atraumatic, conjunttiva clear, no obvious abnormalities on inspection of external nose and ears  NECK: normal movements of the head and neck  LUNGS: on inspection no signs of respiratory distress, breathing rate appears normal, no obvious gross SOB, gasping or wheezing  CV: no obvious cyanosis  MS: moves all visible extremities without noticeable abnormality  PSYCH/NEURO: pleasant and cooperative, no obvious depression or anxiety, speech and thought processing grossly intact  ASSESSMENT AND PLAN:  Discussed the following assessment and plan:  Sequelae of cerebral infarction Right MCA distribution  , presenting with left sided facial weakness and dysarthria.  Both are improving,  But she also notes emotional lability , LOOP recordifelong er placed continue asa and plavix for 3 weeks followed by asa indefinitely .  She is not tolerating the higher dose of crestor for goal LDL < 70  .  Will obtain PA for Repatha   Hyperlipidemia LDL goal <70 She  is not tolerating 20 mg dose of crestor due to leg pain.  Will reduce to 10 mg alt with 20 mg.  And petition for Repatha   Hypertension Increasing telmisartan to 40 mg daily in the evening     I discussed the assessment and treatment plan with the patient. The patient was provided an opportunity to ask questions and all were answered. The patient agreed with the plan and demonstrated an understanding of the instructions.   The patient was advised to call back or seek an in-person evaluation if the symptoms worsen or if the condition fails to improve as anticipated.  I provided 30 minutes of non-face-to-face time during this encounter.   Crecencio Mc, MD

## 2018-11-30 NOTE — Assessment & Plan Note (Addendum)
Right MCA distribution  , presenting with left sided facial weakness and dysarthria.  Both are improving,  But she also notes emotional lability , LOOP recordifelong er placed continue asa and plavix for 3 weeks followed by asa indefinitely .  She is not tolerating the higher dose of crestor for goal LDL < 70  .  Will obtain PA for Repatha

## 2018-11-30 NOTE — Telephone Encounter (Signed)
Pt had a telephone visit with Dr. Derrel Nip today.

## 2018-11-30 NOTE — Assessment & Plan Note (Signed)
Increasing telmisartan to 40 mg daily in the evening

## 2018-12-01 ENCOUNTER — Other Ambulatory Visit: Payer: Self-pay

## 2018-12-01 ENCOUNTER — Ambulatory Visit (INDEPENDENT_AMBULATORY_CARE_PROVIDER_SITE_OTHER): Payer: 59 | Admitting: Student

## 2018-12-01 DIAGNOSIS — I639 Cerebral infarction, unspecified: Secondary | ICD-10-CM

## 2018-12-01 LAB — CUP PACEART INCLINIC DEVICE CHECK
Date Time Interrogation Session: 20200702095754
Implantable Pulse Generator Implant Date: 20200619

## 2018-12-01 NOTE — Progress Notes (Signed)
ILR wound check in clinic. Steri strips came off at home. Wound well healed. Home monitor transmitting nightly. 8 AF episodes which all appear NSR c/ PACs. Sensitivity changed to "less sensitive" and episode storage adjusted to +/> 6 minutes. Questions answered. Pt reminded to send manual transmission today to update carelink with programming changes.   Legrand Como [address] [place], PA-C 12/01/2018 9:48 AM

## 2018-12-06 NOTE — Telephone Encounter (Signed)
PA for Repatha has been denied by insurance.

## 2018-12-07 ENCOUNTER — Other Ambulatory Visit: Payer: Self-pay | Admitting: Internal Medicine

## 2018-12-11 ENCOUNTER — Telehealth: Payer: Self-pay | Admitting: Internal Medicine

## 2018-12-11 DIAGNOSIS — Z0279 Encounter for issue of other medical certificate: Secondary | ICD-10-CM

## 2018-12-11 NOTE — Telephone Encounter (Signed)
FMLA completed ,  Will return to you Monday afternoon.

## 2018-12-12 NOTE — Telephone Encounter (Signed)
LMTCB

## 2018-12-13 NOTE — Telephone Encounter (Signed)
Sent pt a message to let her know that FMLA paperwork has been completed and ready for pick up.

## 2018-12-14 NOTE — Telephone Encounter (Signed)
Form has been faxed and a copy has been mailed to the pt per her request.

## 2018-12-21 ENCOUNTER — Ambulatory Visit (INDEPENDENT_AMBULATORY_CARE_PROVIDER_SITE_OTHER): Payer: 59 | Admitting: *Deleted

## 2018-12-21 DIAGNOSIS — I639 Cerebral infarction, unspecified: Secondary | ICD-10-CM | POA: Diagnosis not present

## 2018-12-22 ENCOUNTER — Encounter: Payer: Self-pay | Admitting: Adult Health

## 2018-12-22 ENCOUNTER — Ambulatory Visit (INDEPENDENT_AMBULATORY_CARE_PROVIDER_SITE_OTHER): Payer: 59 | Admitting: Adult Health

## 2018-12-22 ENCOUNTER — Other Ambulatory Visit: Payer: Self-pay

## 2018-12-22 VITALS — BP 151/94 | HR 75 | Temp 98.6°F | Ht 66.0 in | Wt 213.2 lb

## 2018-12-22 DIAGNOSIS — I10 Essential (primary) hypertension: Secondary | ICD-10-CM

## 2018-12-22 DIAGNOSIS — I639 Cerebral infarction, unspecified: Secondary | ICD-10-CM | POA: Diagnosis not present

## 2018-12-22 DIAGNOSIS — R471 Dysarthria and anarthria: Secondary | ICD-10-CM

## 2018-12-22 DIAGNOSIS — R29810 Facial weakness: Secondary | ICD-10-CM

## 2018-12-22 DIAGNOSIS — R4701 Aphasia: Secondary | ICD-10-CM

## 2018-12-22 DIAGNOSIS — E785 Hyperlipidemia, unspecified: Secondary | ICD-10-CM | POA: Diagnosis not present

## 2018-12-22 LAB — CUP PACEART REMOTE DEVICE CHECK
Date Time Interrogation Session: 20200722164123
Implantable Pulse Generator Implant Date: 20200619

## 2018-12-22 NOTE — Progress Notes (Signed)
[facility] [address] [place]. [place] [address] [phone]       HOSPITAL FOLLOW UP NOTE  [name] Date of Birth:  September 11, 1950 Medical Record Number:  [id number]   Reason for Referral:  hospital stroke follow up    CHIEF COMPLAINT:  Chief Complaint  Patient presents with  . Hospitalization Follow-up    Rm 9, R MCA 11-16-18 s/p tpa, on plavix/asa  . Cerebrovascular Accident    some L facail weakness, speech (improving).  Doing home exercises.      HPI: Lisa Takacs Smithis being seen today for in office hospital follow-up regarding right MCA cortex infarct embolic pattern secondary to unknown source on 11/16/2018 therefore loop recorder placed.  History obtained from patient and chart review. Reviewed all radiology images and labs personally.   Lisa A Smithis a 68 y.o.femalewith history of hypertension and obesity, hyperlipidemiawho presented [facility]2018 for left facial droop and slurred speech. [facility]. Stroke work-up revealed right MCA frontal cortex infarct embolic pattern secondary to unknown source with resultant left facial droop and slurred speech.  MRI showed right MCA frontal cortex infarct.  Vessel imaging and 2D echo unremarkable.  Lower extremity venous Dopplers negative for DVT.  TEE unremarkable without evidence of PFO.  Loop recorder placed on 11/18/2018 to rule out atrial fibrillation.  LDL 137 A1c 5.4.  Recommended DAPT for 3 weeks and aspirin alone.  Previously on rosuvastatin 10 mg daily for HTN management and recommended increasing dose to 20 mg daily.  Other stroke risk factors include HTN, advanced age, obesity and prior history of DVT without evidence of acute DVT.  She was discharged home in stable condition without therapy needs.  Residual deficits of mild left facial droop, mild memory loss and mild aphasia/ dysarthria but overall continues to improve. She continues HEP as recommended by hospital  therapist.  Mild memory loss has been improving with use of compensation strategies with use of Post-it notes for reminders.  Residual symptoms worsen with increased fatigue, increased stress or change of emotion. She is currently on medical leave until 12/30/2018 provided by her PCP.  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  She endorses high-level stress position and large amount of travel as these 19 clinics are spread out throughout 4 different counties.  She is fearful of returning after next week due to increased stress which worsens her cognition and speech which will cause difficulty adequately performing her job duties. Completed 3 weeks DAPT and continues on aspirin alone without bleeding or bruising Continues on rosuvastatin.  Initially experiencing myalgias on 20 mg dose and therefore currently on 10mg  QOD and 20mg  QOD as recommended by her PCP.  She denies reoccurring leg cramps with decreased dose.she has failed use of simvastatin in the past due to inadequate management -Per patient, spoke with PCP about potentially initiating [place] K9 inhibitor Blood pressure today 151/94 - continues to follow with PCP as she endorses difficulty controlling blood pressures Loop recorder is not shown atrial fibrillation thus far She is questioning potential return to driving Denies new or worsening stroke/TIA symptoms    ROS:   14 system review of systems performed and negative with exception of speech difficulty, facial droop and memory loss  PMH:  Past Medical History:  Diagnosis Date  . Diverticulosis 2013   colonoscopy, [place]  . Hyperlipidemia   . Obesity (BMI 30.0-34.9)   . Stroke [facility])  11/17/2018   R MCA cortical, cryptogenic    PSH:  Past Surgical History:  Procedure Laterality Date  . ABDOMINAL HYSTERECTOMY     with wedge resection of right ovary , normal path  . BUBBLE STUDY  11/18/2018   Procedure: BUBBLE STUDY;  Surgeon: [name], MD;  Location: [facility] ENDOSCOPY;  Service: Cardiovascular;;  . [place]  . CESAREAN SECTION  1983  . COSMETIC SURGERY    . ENDOVENOUS ABLATION SAPHENOUS VEIN W/ LASER  2008   for varicose veins,  [place], [place]  . LOOP RECORDER INSERTION N/A 11/18/2018   Procedure: LOOP RECORDER INSERTION;  Surgeon: Thompson Grayer, MD;  Location: [place] CV LAB;  Service: Cardiovascular;  Laterality: N/A;  . TEE WITHOUT CARDIOVERSION N/A 11/18/2018   Procedure: TRANSESOPHAGEAL ECHOCARDIOGRAM (TEE);  Surgeon: [name], MD;  Location: [facility] ENDOSCOPY;  Service: Cardiovascular;  Laterality: N/A;    Social History:  Social History   Socioeconomic History  . Marital status: Married    Spouse name: Not on file  . Number of children: Not on file  . Years of education: Not on file  . Highest education level: Not on file  Occupational History  . Occupation: Production assistant, radio    Comment: OT at [employer]  . Financial resource strain: Not on file  . Food insecurity    Worry: Not on file    Inability: Not on file  . Transportation needs    Medical: Not on file    Non-medical: Not on file  Tobacco Use  . Smoking status: Never Smoker  . Smokeless tobacco: Never Used  Substance and Sexual Activity  . Alcohol use: Yes    Comment: 2 oz red wine daily  . Drug use: No  . Sexual activity: Not on file  Lifestyle  . Physical activity    Days per week: Not on file    Minutes per session: Not on file  . Stress: Not on file  Relationships  . Social Herbalist on phone: Not on file    Gets together: Not on file    Attends religious service: Not on file    Active member of club or organization: Not on file    Attends meetings of clubs or organizations: Not on file    Relationship status: Not on file  . Intimate partner violence    Fear of current or ex partner: Not on file    Emotionally abused: Not on file    Physically abused: Not on file     Forced sexual activity: Not on file  Other Topics Concern  . Not on file  Social History Narrative  . Not on file    Family History:  Family History  Problem Relation Age of Onset  . Coronary artery disease Father 72  . Stroke Father   . Coronary artery disease Paternal Uncle   . Heart disease Paternal Uncle   . Coronary artery disease Paternal Grandmother   . Heart disease Paternal Grandmother   . Coronary artery disease Paternal Grandfather   . Heart disease Paternal Grandfather   . Heart disease Mother   . Cancer Sister 36       ovarian ca  . Heart disease Brother   . Breast cancer Neg Hx     Medications:   Current Outpatient Medications on File Prior to Visit  Medication Sig Dispense Refill  . aspirin EC 81  MG EC tablet Take 1 tablet (81 mg total) by mouth daily.    . rosuvastatin (CRESTOR) 20 MG tablet Take 1 tablet (20 mg total) by mouth daily. (Patient taking differently: Take 20 mg by mouth daily. Is alternating 10mg  qod then 20mg  qod) 30 tablet 2  . telmisartan (MICARDIS) 40 MG tablet Take 1 tablet (40 mg total) by mouth at bedtime. (Patient taking differently: Take 80 mg by mouth at bedtime. ) 90 tablet 1   No current facility-administered medications on file prior to visit.     Allergies:   Allergies  Allergen Reactions  . Wasp Venom Protein Anaphylaxis  . Insect Extract Allergy Skin Test     Insect Stings     Physical Exam  Vitals:   12/22/18 0852  BP: (!) 151/94  Pulse: 75  Temp: 98.6 F (37 C)  Weight: 213 lb 3.2 oz (96.7 kg)  Height: 5\' 6"  (1.676 m)   Body mass index is 34.41 kg/m. No exam data present  Depression screen [facility] 2/9 12/22/2018  Decreased Interest 0  Down, Depressed, Hopeless 0  PHQ - 2 Score 0  Altered sleeping -  Tired, decreased energy -  Change in appetite -  Feeling bad or failure about yourself  -  Trouble concentrating -  Moving slowly or fidgety/restless -  Suicidal thoughts -  PHQ-9 Score -  Difficult doing  work/chores -     General: well developed, well nourished, pleasant middle-age Caucasian female, seated, in no evident distress Head: head normocephalic and atraumatic.   Neck: supple with no carotid or supraclavicular bruits Cardiovascular: regular rate and rhythm, no murmurs Musculoskeletal: no deformity Skin:  no rash/petichiae Vascular:  Normal pulses all extremities   Neurologic Exam Mental Status: Awake and fully alert.  Mild to moderate dysarthria with occasional word finding difficulty. Oriented to place and time. Recent and remote memory intact. Attention span, concentration and fund of knowledge appropriate. Mood and affect appropriate.  Cranial Nerves: Fundoscopic exam reveals sharp disc margins. Pupils equal, briskly reactive to light. Extraocular movements full without nystagmus. Visual fields full to confrontation. Hearing intact.  Left lower facial paralysis noted. Motor: Normal bulk and tone. Normal strength in all tested extremity muscles. Sensory.: intact to touch , pinprick , position and vibratory sensation.  Coordination: Rapid alternating movements normal in all extremities. Finger-to-nose and heel-to-shin performed accurately bilaterally. Gait and Station: Arises from chair without difficulty. Stance is normal. Gait demonstrates normal stride length and balance Reflexes: 1+ and symmetric. Toes downgoing.     NIHSS  3 Modified Rankin  2    Diagnostic Data (Labs, Imaging, Testing)   MRIright MCA frontal cortex infarct  CTA head and neck unremarkable  2D EchoEF 60-65%. No source of embolus   LE venous Doppler neg DVT  TEE unremarkable, no PFO  Loop recorder placed 11/18/18 to rule out cardioembolic source  [id number]  [id number]    ASSESSMENT: Lisa Green is a 68 y.o. year old female presented with left facial droop and slurred speech with stroke work-up revealing right MCA frontal cortex infarct embolic pattern on 5/95/6387 secondary to  undetermined source therefore loop recorder placed to rule out atrial fibrillation. Vascular risk factors include HTN, HLD, history of DVT without evidence of acute DVT and obesity.  Residual deficits of left facial paralysis, mild to moderate dysarthria with occasional expressive aphasia and mild memory loss    PLAN:  1. Right MCA infarct: Continue aspirin 81 mg daily  and rosuvastatin 20 mg with  use of current schedule for secondary stroke prevention. Maintain strict control of hypertension with blood pressure goal below 130/90, diabetes with hemoglobin A1c goal below 6.5% and cholesterol with LDL cholesterol (bad cholesterol) goal below 70 mg/dL.  I also advised the patient to eat a healthy diet with plenty of whole grains, cereals, fruits and vegetables, exercise regularly with at least 30 minutes of continuous activity daily and maintain ideal body weight.  Continue to monitor loop recorder for atrial fibrillation 2. HTN: Advised to continue current treatment regimen.  Today's BP 151/94.  Advised to continue to monitor at home along with continued follow-up with PCP for management 3. HLD: Advised to continue current treatment regimen along with continued follow-up with PCP for future prescribing and monitoring of lipid panel.  She plans on following up with PCP 01/11/2019 for repeat lab work due to change in statin dosing.  Discussion regarding trialing Zetia 10 mg daily in combination with current statin if LDL remains > 70.  She has also been discussing potential use of PCSK9 inhibitor 4. Residual deficits: Continue HEP and advised to notify office if she wishes to pursue outpatient therapy.  Declines at this time as she continues to see improvement on her own.  Recommend extending current medical leave due to continued deficits which will likely worsen with her high stress job.  She is unable to perform a different position, less job duties or part-time.  Recommended leads extending for the next 1 to  2 months for hopeful continued improvement of her ongoing deficits 5. She can return to driving at this time and recommend having a family member ride with her for the first few times while avoiding main roads, highways or nighttime driving 6. Interest in [place] trial with research team providing additional information to patient.    Follow up in 3 months or call earlier if needed   Greater than 50% of time during this 45 minute visit was spent on counseling, explanation of diagnosis of right MCA infarct, reviewing risk factor management of HTN and HLD, planning of further management along with potential future management, and discussion with patient and family answering all questions.    Venancio Poisson, AGNP-BC  [facility] [address] [place], [place] [address]  Phone [phone] Fax [phone] Note: This document was prepared with digital dictation and possible smart phrase technology. Any transcriptional errors that result from this process are unintentional.

## 2018-12-22 NOTE — Progress Notes (Signed)
I agree with the above plan 

## 2018-12-22 NOTE — Patient Instructions (Addendum)
Continue aspirin 81 mg daily  and Crestor 20 mg for secondary stroke prevention  Consider use of Zetia if no improvement with change in Crestor dose   Continue to do home exercises and let us know if interested in doing any type of outpatient therapy down the road  Continue to follow up with PCP regarding cholesterol and blood pressure management   Continue to monitor blood pressure at home  Consider participation in [place] trial  You can return to driving with having a family member for the first few times. Avoid main roads, highways and night time driving for the next few weeks  Maintain strict control of hypertension with blood pressure goal below 130/90, diabetes with hemoglobin A1c goal below 6.5% and cholesterol with LDL cholesterol (bad cholesterol) goal below 70 mg/dL. I also advised the patient to eat a healthy diet with plenty of whole grains, cereals, fruits and vegetables, exercise regularly and maintain ideal body weight.  Followup in the future with me in 3 months or call earlier if needed       Thank you for coming to see [place] at [facility]. I hope we have been able to provide you high quality care today.  You may receive a patient satisfaction survey over the next few weeks. We would appreciate your feedback and comments so that we may continue to improve ourselves and the health of our patients.

## 2019-01-05 NOTE — Progress Notes (Signed)
Carelink Summary Report / Loop Recorder 

## 2019-01-11 ENCOUNTER — Other Ambulatory Visit (INDEPENDENT_AMBULATORY_CARE_PROVIDER_SITE_OTHER): Payer: 59

## 2019-01-11 ENCOUNTER — Other Ambulatory Visit: Payer: Self-pay

## 2019-01-11 DIAGNOSIS — I10 Essential (primary) hypertension: Secondary | ICD-10-CM

## 2019-01-11 DIAGNOSIS — E785 Hyperlipidemia, unspecified: Secondary | ICD-10-CM

## 2019-01-11 DIAGNOSIS — K760 Fatty (change of) liver, not elsewhere classified: Secondary | ICD-10-CM

## 2019-01-11 LAB — COMPREHENSIVE METABOLIC PANEL
ALT: 24 U/L (ref 0–35)
AST: 20 U/L (ref 0–37)
Albumin: 4.5 g/dL (ref 3.5–5.2)
Alkaline Phosphatase: 80 U/L (ref 39–117)
BUN: 8 mg/dL (ref 6–23)
CO2: 28 mEq/L (ref 19–32)
Calcium: 9.5 mg/dL (ref 8.4–10.5)
Chloride: 105 mEq/L (ref 96–112)
Creatinine, Ser: 0.67 mg/dL (ref 0.40–1.20)
GFR: 87.85 mL/min (ref >60.00)
Glucose, Bld: 90 mg/dL (ref 70–99)
Potassium: 4.1 mEq/L (ref 3.5–5.1)
Sodium: 142 mEq/L (ref 135–145)
Total Bilirubin: 0.5 mg/dL (ref 0.2–1.2)
Total Protein: 6.4 g/dL (ref 6.0–8.3)

## 2019-01-11 LAB — BASIC METABOLIC PANEL
BUN: 8 mg/dL (ref 6–23)
CO2: 28 mEq/L (ref 19–32)
Calcium: 9.5 mg/dL (ref 8.4–10.5)
Chloride: 105 mEq/L (ref 96–112)
Creatinine, Ser: 0.67 mg/dL (ref 0.40–1.20)
GFR: 87.85 mL/min (ref >60.00)
Glucose, Bld: 90 mg/dL (ref 70–99)
Potassium: 4.1 mEq/L (ref 3.5–5.1)
Sodium: 142 mEq/L (ref 135–145)

## 2019-01-11 LAB — LDL CHOLESTEROL, DIRECT: Direct LDL: 77 mg/dL

## 2019-01-11 LAB — LIPID PANEL
Cholesterol: 148 mg/dL (ref 0–200)
HDL: 39.8 mg/dL (ref >39.00)
NonHDL: 108.64
Total CHOL/HDL Ratio: 4
Triglycerides: 241 mg/dL — ABNORMAL HIGH (ref 0.0–149.0)
VLDL: 48.2 mg/dL — ABNORMAL HIGH (ref 0.0–40.0)

## 2019-01-16 ENCOUNTER — Telehealth: Payer: Self-pay

## 2019-01-16 NOTE — Telephone Encounter (Signed)
AF alert received, 01/14/19 w/  8 hour duration, overall rates in 120s. No hx of AF and no OAC. Will route to Dr. Rayann Heman for review.

## 2019-01-18 MED ORDER — TELMISARTAN 80 MG PO TABS: 80.0000 mg | ORAL_TABLET | Freq: Every day | ORAL | 1 refills | Status: DC

## 2019-01-18 MED ORDER — HYDROCHLOROTHIAZIDE 12.5 MG PO CAPS: 12.5000 mg | ORAL_CAPSULE | Freq: Every day | ORAL | 1 refills | Status: DC

## 2019-01-23 ENCOUNTER — Ambulatory Visit (INDEPENDENT_AMBULATORY_CARE_PROVIDER_SITE_OTHER): Payer: 59 | Admitting: *Deleted

## 2019-01-23 DIAGNOSIS — I6389 Other cerebral infarction: Secondary | ICD-10-CM

## 2019-01-23 DIAGNOSIS — I639 Cerebral infarction, unspecified: Secondary | ICD-10-CM

## 2019-01-24 NOTE — Telephone Encounter (Signed)
FMLA extended through August 31 ,s signed and dated and returned in red folder

## 2019-01-25 NOTE — Telephone Encounter (Signed)
Form has been faxed.

## 2019-01-27 NOTE — Telephone Encounter (Signed)
Please start anticoagulation per protocol and refer to afib clinic for follow-up  Thompson Grayer MD, [facility] 01/27/2019 4:19 PM

## 2019-01-30 NOTE — Progress Notes (Signed)
Carelink Summary Report / Loop Recorder 

## 2019-02-01 ENCOUNTER — Telehealth: Payer: Self-pay | Admitting: *Deleted

## 2019-02-01 NOTE — Telephone Encounter (Signed)
Completed FMLA form for pt.  To JM/NP for review and signature.

## 2019-02-01 NOTE — Telephone Encounter (Signed)
Per review of her chart, her PCP agreed to extend her leave until 01/31/2019 with plans on retiring on 03/02/2019.

## 2019-02-01 NOTE — Telephone Encounter (Signed)
LMOVM requesting call back to DC. Advised I will also send MyChart message (responded to MyChart message patient sent on 02/01/19).

## 2019-02-02 ENCOUNTER — Ambulatory Visit (HOSPITAL_COMMUNITY)
Admission: RE | Admit: 2019-02-02 | Discharge: 2019-02-02 | Disposition: A | Discharge status: Home / Self Care | Payer: 59 | Source: Ambulatory Visit | Attending: Nurse Practitioner | Admitting: Nurse Practitioner

## 2019-02-02 ENCOUNTER — Other Ambulatory Visit: Payer: Self-pay

## 2019-02-02 ENCOUNTER — Encounter (HOSPITAL_COMMUNITY): Payer: Self-pay | Admitting: Nurse Practitioner

## 2019-02-02 VITALS — BP 132/80 | HR 72 | Ht 66.0 in | Wt 212.0 lb

## 2019-02-02 DIAGNOSIS — E785 Hyperlipidemia, unspecified: Secondary | ICD-10-CM | POA: Diagnosis not present

## 2019-02-02 DIAGNOSIS — Z7982 Long term (current) use of aspirin: Secondary | ICD-10-CM | POA: Insufficient documentation

## 2019-02-02 DIAGNOSIS — Z79899 Other long term (current) drug therapy: Secondary | ICD-10-CM | POA: Diagnosis not present

## 2019-02-02 DIAGNOSIS — I10 Essential (primary) hypertension: Secondary | ICD-10-CM | POA: Diagnosis not present

## 2019-02-02 DIAGNOSIS — I480 Paroxysmal atrial fibrillation: Secondary | ICD-10-CM | POA: Insufficient documentation

## 2019-02-02 DIAGNOSIS — Z8673 Personal history of transient ischemic attack (TIA), and cerebral infarction without residual deficits: Secondary | ICD-10-CM | POA: Insufficient documentation

## 2019-02-02 DIAGNOSIS — Z8249 Family history of ischemic heart disease and other diseases of the circulatory system: Secondary | ICD-10-CM | POA: Insufficient documentation

## 2019-02-02 DIAGNOSIS — Z823 Family history of stroke: Secondary | ICD-10-CM | POA: Insufficient documentation

## 2019-02-02 DIAGNOSIS — I4891 Unspecified atrial fibrillation: Secondary | ICD-10-CM | POA: Diagnosis present

## 2019-02-02 DIAGNOSIS — Z6834 Body mass index (BMI) 34.0-34.9, adult: Secondary | ICD-10-CM | POA: Insufficient documentation

## 2019-02-02 DIAGNOSIS — E669 Obesity, unspecified: Secondary | ICD-10-CM | POA: Diagnosis not present

## 2019-02-02 MED ORDER — APIXABAN 5 MG PO TABS: 5.0000 mg | ORAL_TABLET | Freq: Two times a day (BID) | ORAL | 6 refills | Status: DC

## 2019-02-02 NOTE — Addendum Note (Signed)
Encounter addended by: Sherran Needs, NP on: 02/02/2019 12:09 PM  Actions taken: Clinical Note Signed

## 2019-02-02 NOTE — Telephone Encounter (Signed)
See MyChart message--patient agreeable to visit. Message sent to AF Clinic staff to schedule.

## 2019-02-02 NOTE — Patient Instructions (Signed)
Stop aspirin Start Eliquis 5mg  twice a day  Have CBC/BMET drawn in 1 month with Dr. Derrel Nip - have lab results faxed to [phone]

## 2019-02-02 NOTE — Progress Notes (Addendum)
Primary Care Physician: Crecencio Mc, MD Referring Physician: Dr. Hulan Amato is a 68 y.o. female with a h/o HTN,  CVA in June 2020 with a Linq  implanted at that time.  It recently showed 8 hours of afib from which  pt was totally unaware. She was referred here for start of anticoagulation other than  ASA. No bleeding history.  Today, she denies symptoms of palpitations, chest pain, shortness of breath, orthopnea, PND, lower extremity edema, dizziness, presyncope, syncope, or neurologic sequela. The patient is tolerating medications without difficulties and is otherwise without complaint today.   Past Medical History:  Diagnosis Date  . Diverticulosis 2013   colonoscopy, [place]  . Hyperlipidemia   . Obesity (BMI 30.0-34.9)   . Stroke [facility]) 11/17/2018   R MCA cortical, cryptogenic   Past Surgical History:  Procedure Laterality Date  . ABDOMINAL HYSTERECTOMY     with wedge resection of right ovary , normal path  . BUBBLE STUDY  11/18/2018   Procedure: BUBBLE STUDY;  Surgeon: [name], MD;  Location: [facility] ENDOSCOPY;  Service: Cardiovascular;;  . [place]  . CESAREAN SECTION  1983  . COSMETIC SURGERY    . ENDOVENOUS ABLATION SAPHENOUS VEIN W/ LASER  2008   for varicose veins,  [place], [place]  . LOOP RECORDER INSERTION N/A 11/18/2018   Procedure: LOOP RECORDER INSERTION;  Surgeon: Thompson Grayer, MD;  Location: [place] CV LAB;  Service: Cardiovascular;  Laterality: N/A;  . TEE WITHOUT CARDIOVERSION N/A 11/18/2018   Procedure: TRANSESOPHAGEAL ECHOCARDIOGRAM (TEE);  Surgeon: [name], MD;  Location[facility] ENDOSCOPY;  Service: Cardiovascular;  Laterality: N/A;    Current Outpatient Medications  Medication Sig Dispense Refill  . hydrochlorothiazide (MICROZIDE) 12.5 MG capsule Take 1 capsule (12.5 mg total) by mouth daily. 90 capsule 1  . rosuvastatin (CRESTOR) 20 MG tablet Take 1 tablet (20 mg total) by mouth daily. (Patient taking  differently: Take 20 mg by mouth daily. Is alternating 10mg  qod then 20mg  qod) 30 tablet 2  . telmisartan (MICARDIS) 80 MG tablet Take 1 tablet (80 mg total) by mouth at bedtime. 90 tablet 1  . apixaban (ELIQUIS) 5 MG TABS tablet Take 1 tablet (5 mg total) by mouth 2 (two) times daily. 60 tablet 6   No current facility-administered medications for this encounter.     Allergies  Allergen Reactions  . Wasp Venom Protein Anaphylaxis  . Insect Extract Allergy Skin Test     Insect Stings    Social History   Socioeconomic History  . Marital status: Married    Spouse name: Not on file  . Number of children: Not on file  . Years of education: Not on file  . Highest education level: Not on file  Occupational History  . Occupation: Production assistant, radio    Comment: OT at [employer]  . Financial resource strain: Not on file  . Food insecurity    Worry: Not on file    Inability: Not on file  . Transportation needs    Medical: Not on file    Non-medical: Not on file  Tobacco Use  . Smoking status: Never Smoker  . Smokeless tobacco: Never Used  Substance and Sexual Activity  . Alcohol use: Yes    Comment: 2 oz red wine daily  . Drug use: No  . Sexual activity: Not on file  Lifestyle  . Physical activity  Days per week: Not on file    Minutes per session: Not on file  . Stress: Not on file  Relationships  . Social Herbalist on phone: Not on file    Gets together: Not on file    Attends religious service: Not on file    Active member of club or organization: Not on file    Attends meetings of clubs or organizations: Not on file    Relationship status: Not on file  . Intimate partner violence    Fear of current or ex partner: Not on file    Emotionally abused: Not on file    Physically abused: Not on file    Forced sexual activity: Not on file  Other Topics Concern  . Not on file  Social History Narrative  . Not on file    Family History   Problem Relation Age of Onset  . Coronary artery disease Father 31  . Stroke Father   . Coronary artery disease Paternal Uncle   . Heart disease Paternal Uncle   . Coronary artery disease Paternal Grandmother   . Heart disease Paternal Grandmother   . Coronary artery disease Paternal Grandfather   . Heart disease Paternal Grandfather   . Heart disease Mother   . Cancer Sister 61       ovarian ca  . Heart disease Brother   . Breast cancer Neg Hx     ROS- All systems are reviewed and negative except as per the HPI above  Physical Exam: Vitals:   02/02/19 1125  BP: 132/80  Pulse: 72  Weight: 96.2 kg  Height: 5\' 6"  (1.676 m)   Wt Readings from Last 3 Encounters:  02/02/19 96.2 kg  12/22/18 96.7 kg  11/18/18 95.6 kg    Labs: Lab Results  Component Value Date   NA 142 01/11/2019   NA 142 01/11/2019   K 4.1 01/11/2019   K 4.1 01/11/2019   CL 105 01/11/2019   CL 105 01/11/2019   CO2 28 01/11/2019   CO2 28 01/11/2019   GLUCOSE 90 01/11/2019   GLUCOSE 90 01/11/2019   BUN 8 01/11/2019   BUN 8 01/11/2019   CREATININE 0.67 01/11/2019   CREATININE 0.67 01/11/2019   CALCIUM 9.5 01/11/2019   CALCIUM 9.5 01/11/2019   No results found for: INR Lab Results  Component Value Date   CHOL 148 01/11/2019   HDL 39.80 01/11/2019   LDLCALC 132 (H) 11/17/2018   TRIG 241.0 (H) 01/11/2019     GEN- The patient is well appearing, alert and oriented x 3 today.   Head- normocephalic, atraumatic Eyes-  Sclera clear, conjunctiva pink Ears- hearing intact Oropharynx- clear Neck- supple, no JVP Lymph- no cervical lymphadenopathy Lungs- Clear to ausculation bilaterally, normal work of breathing Heart- Regular rate and rhythm, no murmurs, rubs or gallops, PMI not laterally displaced GI- soft, NT, ND, + BS Extremities- no clubbing, cyanosis, or edema MS- no significant deformity or atrophy Skin- no rash or lesion Psych- euthymic mood, full affect Neuro- strength and sensation are  intact  EKG- NSR at 72 bpm, Pr int 124 ms, qrs int 124 ms, qtc 389 ms  Echo-IMPRESSIONS   1. The left ventricle has normal systolic function with an ejection fraction of 60-65%. The cavity size was normal. There is mildly increased left ventricular wall thickness. Left ventricular diastolic Doppler parameters are consistent with impaired  relaxation.  2. The right ventricle has normal systolic function. The cavity was  normal. There is no increase in right ventricular wall thickness.  3. No intracardiac thrombi or masses were visualized.  Paceart report- AF alert 01/14/19  8 hours afib duration, v rates in the 120's   Assessment and Plan: 1. Paroxysmal afib General education  Pt was not aware of occurrence No need for rate control at this time, afib burden low  Continue surveillance thru paceart reports  2. CHA2DS2VASc score of 5 No bleeding history Discussed stroke risk and need for anticoagulation Options discussed, eliquis, xarelto, coumadin or pradaxa Pt chose to start eliquis 5 mg bid  Stop ASA Bleeding precautions discussed  Cbc/bmet in one month, f/u start of anticoagulation  Pt will have this done with PCP in [facility] and faxed here F/u with other providers as scheduled afib clinic as needed  Butch Penny C. Audie Wieser, [facility] [address] [place], [place] [address] [phone]

## 2019-02-07 ENCOUNTER — Other Ambulatory Visit: Payer: Self-pay | Admitting: Adult Health

## 2019-02-07 IMAGING — MG MM DIGITAL SCREENING BILAT W/ CAD
4 series · 4 of 4 positions shown · non-contrast
Comparison: Previous exam(s).

CLINICAL DATA: Screening.

EXAM:
DIGITAL SCREENING BILATERAL MAMMOGRAM WITH CAD

[R CC]
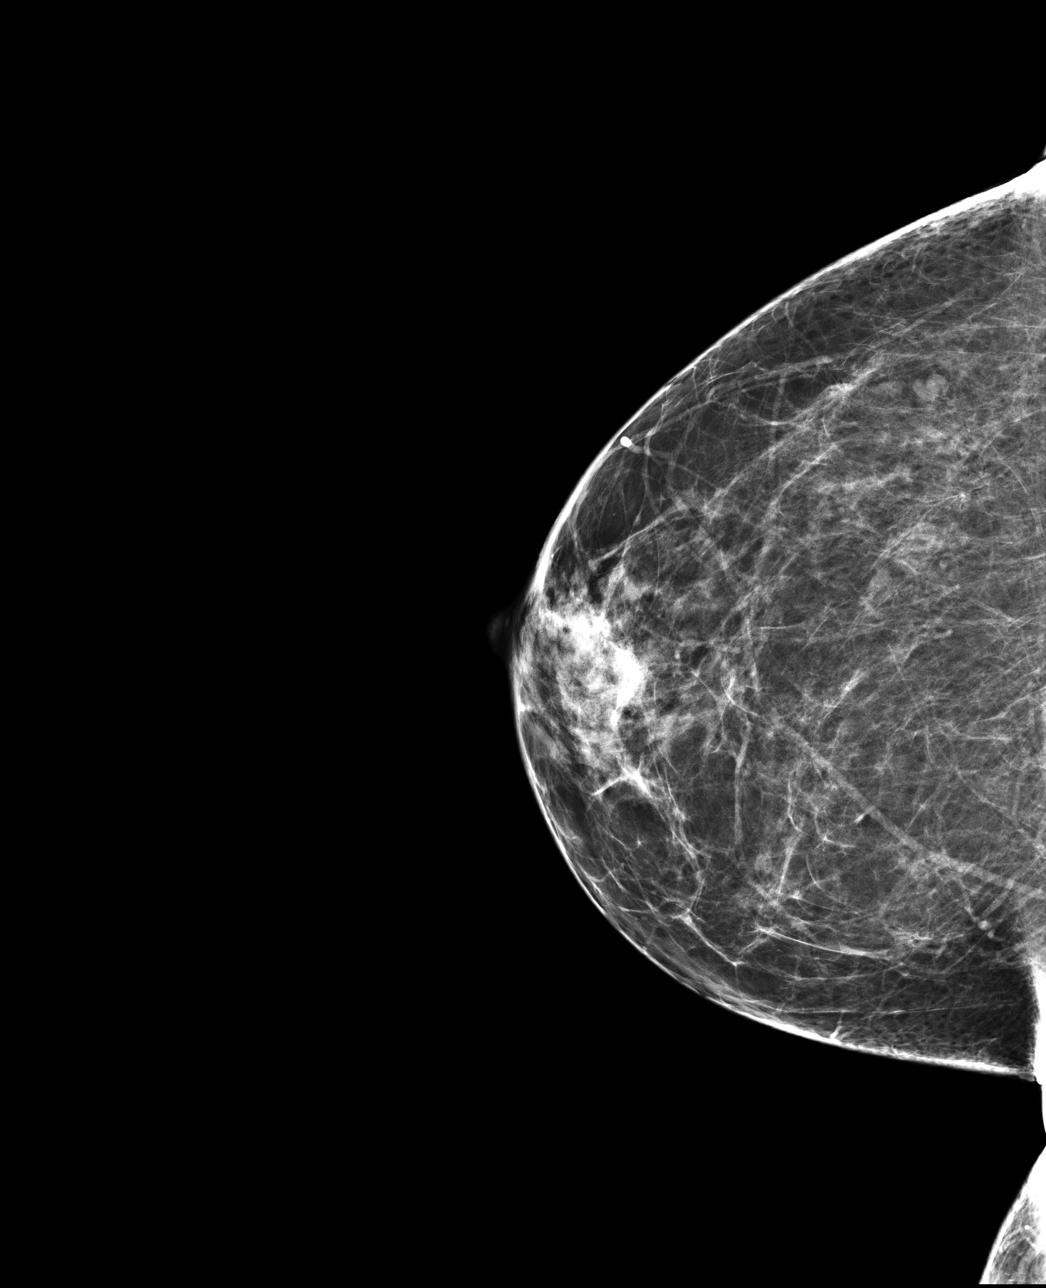

[L CC]
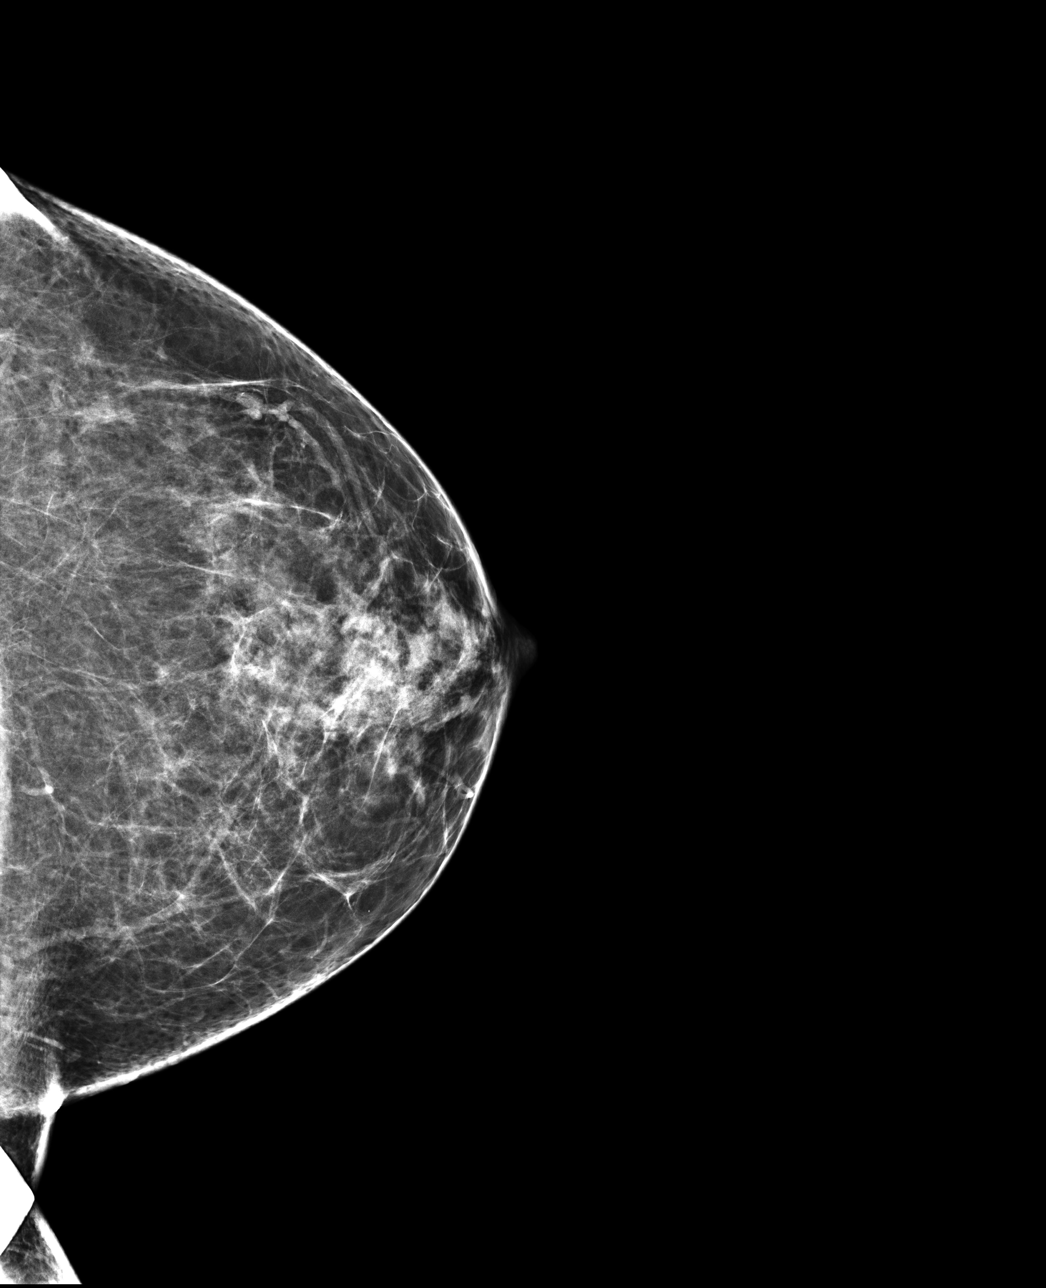

[L MLO]
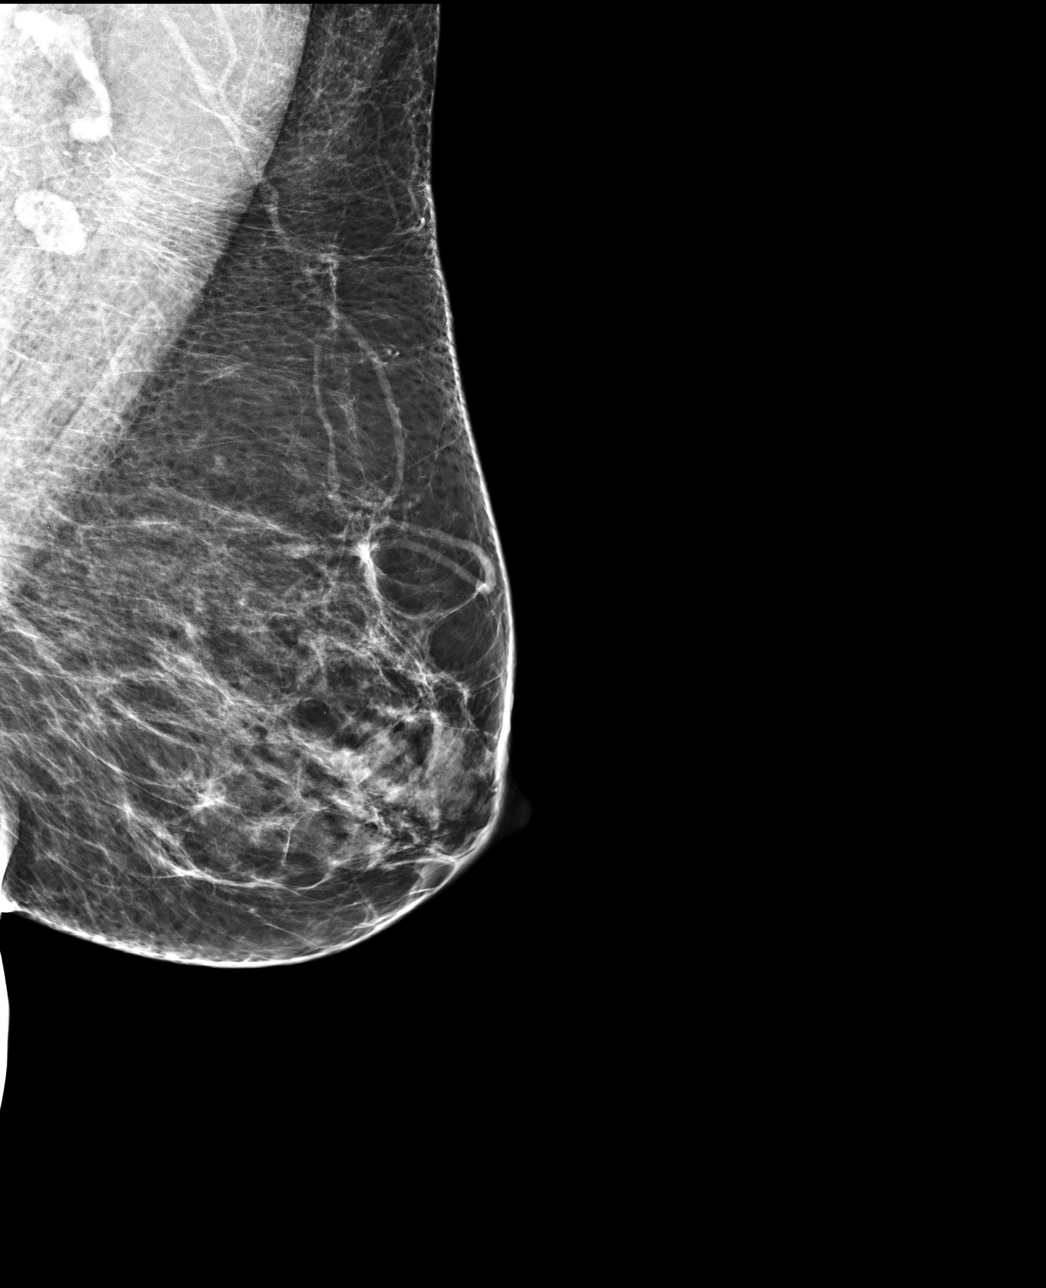

[R MLO]
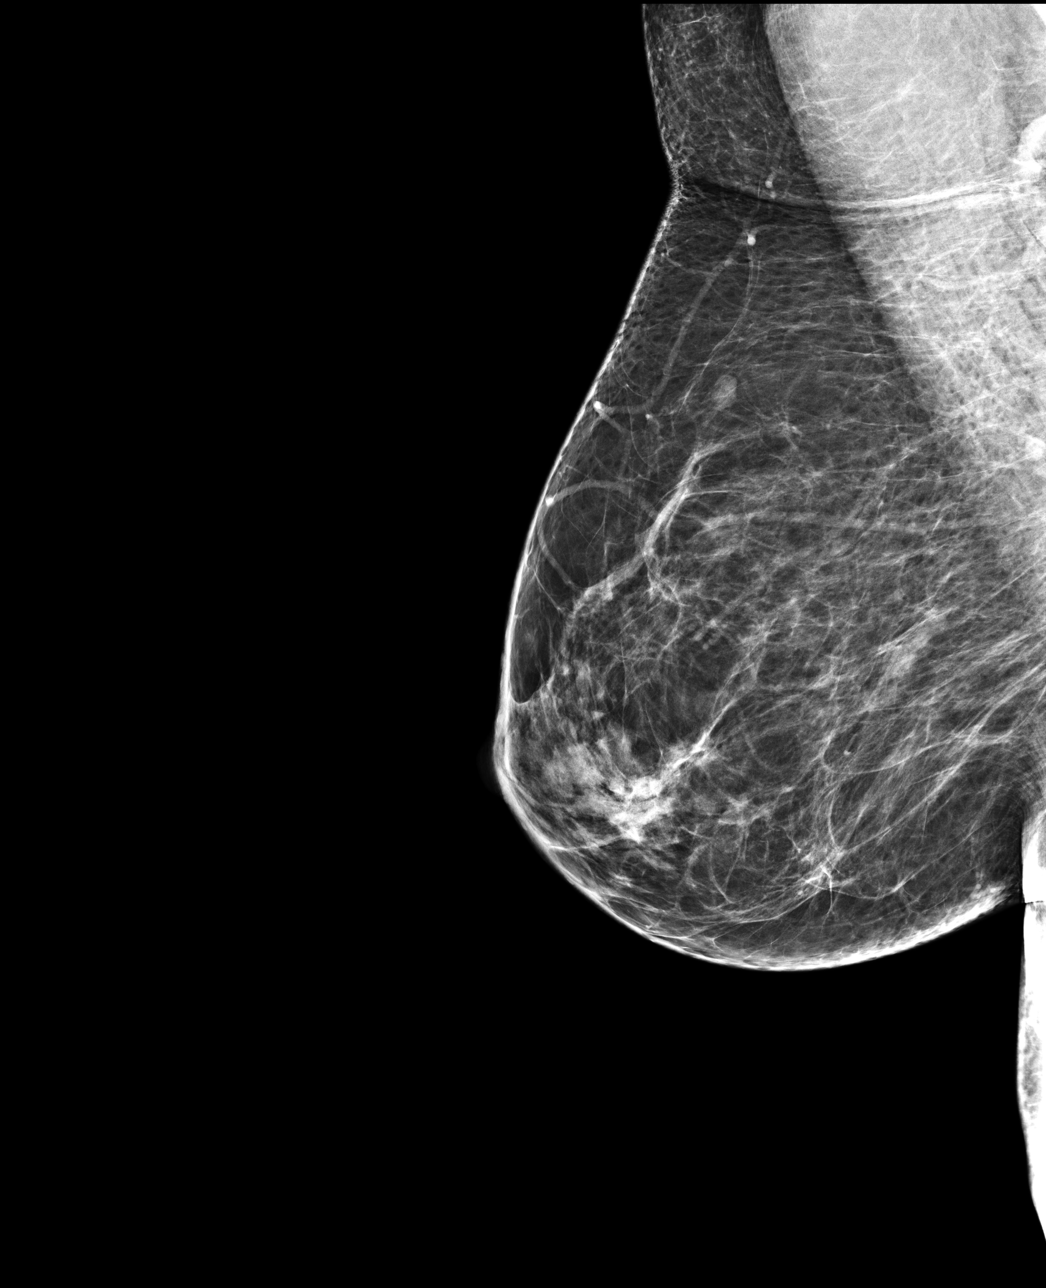

[4 of 4 positions shown; findings below may reference images not displayed]

ACR Breast Density Category b: There are scattered areas of
fibroglandular density.
FINDINGS: There are no findings suspicious for malignancy. Images were
processed with CAD.
IMPRESSION: No mammographic evidence of malignancy. A result letter of this
screening mammogram will be mailed directly to the patient.

RECOMMENDATION:
Screening mammogram in one year. (Code:AS-G-LCT)

BI-RADS CATEGORY  1: Negative.

## 2019-02-07 NOTE — Telephone Encounter (Signed)
I called pt and she stated that her pcp did do this p/w for her already.  FMLA does not need to be done on our end.

## 2019-02-07 NOTE — Progress Notes (Signed)
Error

## 2019-02-23 ENCOUNTER — Other Ambulatory Visit: Payer: Self-pay

## 2019-02-23 NOTE — Patient Outreach (Signed)
First attempt to obtain mRs. No answer. Left message for return call.  

## 2019-02-27 ENCOUNTER — Ambulatory Visit: Payer: 59 | Admitting: *Deleted

## 2019-02-27 DIAGNOSIS — I639 Cerebral infarction, unspecified: Secondary | ICD-10-CM

## 2019-02-28 ENCOUNTER — Other Ambulatory Visit: Payer: Self-pay

## 2019-02-28 LAB — CUP PACEART REMOTE DEVICE CHECK
Date Time Interrogation Session: 20200929033612
Implantable Pulse Generator Implant Date: 20200619

## 2019-03-02 ENCOUNTER — Other Ambulatory Visit: Payer: Self-pay

## 2019-03-02 NOTE — Patient Outreach (Signed)
Telephone outreach to patient to obtain mRs was successfully completed. mRs= 1. 

## 2019-03-08 NOTE — Progress Notes (Signed)
Carelink Summary Report / Loop Recorder 

## 2019-03-27 ENCOUNTER — Telehealth: Payer: Self-pay | Admitting: *Deleted

## 2019-03-27 NOTE — Telephone Encounter (Signed)
Please place future orders for lab appt.  

## 2019-03-28 ENCOUNTER — Other Ambulatory Visit: Payer: Self-pay

## 2019-03-28 ENCOUNTER — Other Ambulatory Visit (INDEPENDENT_AMBULATORY_CARE_PROVIDER_SITE_OTHER): Payer: 59

## 2019-03-28 DIAGNOSIS — I480 Paroxysmal atrial fibrillation: Secondary | ICD-10-CM

## 2019-03-28 NOTE — Telephone Encounter (Signed)
I did not request labs,  But Roderic Palau ordered some for cardiology

## 2019-03-29 ENCOUNTER — Ambulatory Visit (INDEPENDENT_AMBULATORY_CARE_PROVIDER_SITE_OTHER): Payer: 59 | Admitting: Adult Health

## 2019-03-29 ENCOUNTER — Encounter: Payer: Self-pay | Admitting: Adult Health

## 2019-03-29 VITALS — BP 124/84 | HR 65 | Temp 97.8°F | Ht 66.0 in | Wt 214.6 lb

## 2019-03-29 DIAGNOSIS — E785 Hyperlipidemia, unspecified: Secondary | ICD-10-CM | POA: Diagnosis not present

## 2019-03-29 DIAGNOSIS — I63411 Cerebral infarction due to embolism of right middle cerebral artery: Secondary | ICD-10-CM | POA: Diagnosis not present

## 2019-03-29 DIAGNOSIS — I10 Essential (primary) hypertension: Secondary | ICD-10-CM

## 2019-03-29 DIAGNOSIS — R471 Dysarthria and anarthria: Secondary | ICD-10-CM

## 2019-03-29 DIAGNOSIS — I480 Paroxysmal atrial fibrillation: Secondary | ICD-10-CM | POA: Diagnosis not present

## 2019-03-29 LAB — CBC
Hematocrit: 39.6 % (ref 34.0–46.6)
Hemoglobin: 13.1 g/dL (ref 11.1–15.9)
MCH: 31.3 pg (ref 26.6–33.0)
MCHC: 33.1 g/dL (ref 31.5–35.7)
MCV: 95 fL (ref 79–97)
Platelets: 225 10*3/uL (ref 150–450)
RBC: 4.18 x10E6/uL (ref 3.77–5.28)
RDW: 12.4 % (ref 11.7–15.4)
WBC: 4.4 10*3/uL (ref 3.4–10.8)

## 2019-03-29 LAB — BASIC METABOLIC PANEL
BUN/Creatinine Ratio: 19 (ref 12–28)
BUN: 14 mg/dL (ref 8–27)
CO2: 26 mmol/L (ref 20–29)
Calcium: 9.5 mg/dL (ref 8.7–10.3)
Chloride: 104 mmol/L (ref 96–106)
Creatinine, Ser: 0.74 mg/dL (ref 0.57–1.00)
GFR calc Af Amer: 98 mL/min/{1.73_m2} (ref >59)
GFR calc non Af Amer: 85 mL/min/{1.73_m2} (ref >59)
Glucose: 90 mg/dL (ref 65–99)
Potassium: 4.5 mmol/L (ref 3.5–5.2)
Sodium: 143 mmol/L (ref 134–144)

## 2019-03-29 NOTE — Patient Instructions (Signed)
Continue Eliquis (apixaban) daily  and Crestor for secondary stroke prevention  Continue to follow with cardiology for atrial fibrillation and Eliquis management  Continue to follow up with PCP regarding blood pressure and cholesterol management   Continue to monitor blood pressure at home  Maintain strict control of hypertension with blood pressure goal below 130/90, diabetes with hemoglobin A1c goal below 6.5% and cholesterol with LDL cholesterol (bad cholesterol) goal below 70 mg/dL. I also advised the patient to eat a healthy diet with plenty of whole grains, cereals, fruits and vegetables, exercise regularly and maintain ideal body weight.  Keep enjoying retirement!!!   Followup in the future with me in 6 months or call earlier if needed       Thank you for coming to see [place] at [facility]. I hope we have been able to provide you high quality care today.  You may receive a patient satisfaction survey over the next few weeks. We would appreciate your feedback and comments so that we may continue to improve ourselves and the health of our patients.

## 2019-03-29 NOTE — Progress Notes (Signed)
[facility] [address] [place]. [place] [address] [phone]       HOSPITAL FOLLOW UP NOTE  Lisa Green Date of Birth:  1950/09/15 Medical Record Number:  [id number]   Reason for visit: Right MCA stroke 11/16/2018    CHIEF COMPLAINT:  Chief Complaint  Patient presents with  . Follow-up    3 mon f/u. Alone. Treatment room. No new concerns at this time.     HPI:  Stroke admission 11/16/2018: LisaLisa A Smithis a 68 y.o.femalewith history of hypertension and obesity, hyperlipidemiawho [facility] for left facial droop and slurred speech. tPA givenat [facility]. Stroke work-up revealed right MCA frontal cortex infarct embolic pattern secondary to unknown source with resultant left facial droop and slurred speech.  MRI showed right MCA frontal cortex infarct.  Vessel imaging and 2D echo unremarkable.  Lower extremity venous Dopplers negative for DVT.  TEE unremarkable without evidence of PFO.  Loop recorder placed on 11/18/2018 to rule out atrial fibrillation.  LDL 137 A1c 5.4.  Recommended DAPT for 3 weeks and aspirin alone.  Previously on rosuvastatin 10 mg daily for HTN management and recommended increasing dose to 20 mg daily.  Other stroke risk factors include HTN, advanced age, obesity and prior history of DVT without evidence of acute DVT.  She was discharged home in stable condition without therapy needs.  Initial visit 12/22/2018: Residual deficits of mild left facial droop, mild memory loss and mild aphasia/ dysarthria but overall continues to improve. She continues HEP as recommended by hospital therapist.  Mild memory loss has been improving with use of compensation strategies with use of Post-it notes for reminders.  Residual symptoms worsen with increased fatigue, increased stress or change of emotion. She is currently on medical leave until 12/30/2018 provided by her PCP.  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  She endorses high-level stress position and large amount of travel as these 19 clinics are spread out throughout 4 different counties.  She is fearful of returning after next week due to increased stress which worsens her cognition and speech which will cause difficulty adequately performing her job duties. Completed 3 weeks DAPT and continues on aspirin alone without bleeding or bruising Continues on rosuvastatin.  Initially experiencing myalgias on 20 mg dose and therefore currently on 10mg  QOD and 20mg  QOD as recommended by her PCP.  She denies reoccurring leg cramps with decreased dose.she has failed use of simvastatin in the past due to inadequate management -Per patient, spoke with PCP about potentially initiating [place] inhibitor Blood pressure today 151/94 - continues to follow with PCP as she endorses difficulty controlling blood pressures Loop recorder is not shown atrial fibrillation thus far She is questioning potential return to driving Denies new or worsening stroke/TIA symptoms  Update 03/29/2019: [name] is a 68 year old female who is being seen today for stroke follow-up.  Residual deficits of greatly improved with speech close to baseline and no residual cognitive issues.  She returned to work on 01/31/2019 and retired on 03/02/2019 as planned.  She has been greatly enjoying retirement keeping active and doing projects around her house.  Since prior visit, loop recorder showed 8 hours of atrial fibrillation without symptoms.  Eliquis 5 mg twice daily initiated for secondary stroke prevention and discontinued aspirin.  Continues on Eliquis without bleeding or bruising.  Continues on rosuvastatin without myalgias.  Recent LDL 77.  Blood pressure  today 124/84.  Previously elevated blood pressures but greatly improved after initiation of hydrochlorothiazide 12.5 mg daily.  Denies new or worsening stroke/TIA symptoms.    ROS:   14 system review of  systems performed and negative with exception of no complaints  PMH:  Past Medical History:  Diagnosis Date  . Diverticulosis 2013   colonoscopy, [place]  . Hyperlipidemia   . Obesity (BMI 30.0-34.9)   . Stroke [facility]) 11/17/2018   R MCA cortical, cryptogenic    PSH:  Past Surgical History:  Procedure Laterality Date  . ABDOMINAL HYSTERECTOMY     with wedge resection of right ovary , normal path  . BUBBLE STUDY  11/18/2018   Procedure: BUBBLE STUDY;  Surgeon: [name], MD;  Location: [facility] ENDOSCOPY;  Service: Cardiovascular;;  . [place]  . CESAREAN SECTION  1983  . COSMETIC SURGERY    . ENDOVENOUS ABLATION SAPHENOUS VEIN W/ LASER  2008   for varicose veins,  [place], [place]  . LOOP RECORDER INSERTION N/A 11/18/2018   Procedure: LOOP RECORDER INSERTION;  Surgeon: Thompson Grayer, MD;  Location: [facility] CV LAB;  Service: Cardiovascular;  Laterality: N/A;  . TEE WITHOUT CARDIOVERSION N/A 11/18/2018   Procedure: TRANSESOPHAGEAL ECHOCARDIOGRAM (TEE);  Surgeon: [name], MD;  Location: [facility] ENDOSCOPY;  Service: Cardiovascular;  Laterality: N/A;    Social History:  Social History   Socioeconomic History  . Marital status: Married    Spouse name: Not on file  . Number of children: Not on file  . Years of education: Not on file  . Highest education level: Not on file  Occupational History  . Occupation: Production assistant, radio    Comment: OT at [employer]  . Financial resource strain: Not on file  . Food insecurity    Worry: Not on file    Inability: Not on file  . Transportation needs    Medical: Not on file    Non-medical: Not on file  Tobacco Use  . Smoking status: Never Smoker  . Smokeless tobacco: Never Used  Substance and Sexual Activity  . Alcohol use: Yes    Comment: 2 oz red wine daily  . Drug use: No  . Sexual activity: Not on file  Lifestyle  . Physical activity    Days per week: Not on file    Minutes per  session: Not on file  . Stress: Not on file  Relationships  . Social Herbalist on phone: Not on file    Gets together: Not on file    Attends religious service: Not on file    Active member of club or organization: Not on file    Attends meetings of clubs or organizations: Not on file    Relationship status: Not on file  . Intimate partner violence    Fear of current or ex partner: Not on file    Emotionally abused: Not on file    Physically abused: Not on file    Forced sexual activity: Not on file  Other Topics Concern  . Not on file  Social History Narrative  . Not on file    Family History:  Family History  Problem Relation Age of Onset  . Coronary artery disease Father 21  . Stroke Father   . Coronary artery disease Paternal Uncle   . Heart disease Paternal Uncle   . Coronary artery disease Paternal Grandmother   . Heart disease  Paternal Grandmother   . Coronary artery disease Paternal Grandfather   . Heart disease Paternal Grandfather   . Heart disease Mother   . Cancer Sister 40       ovarian ca  . Heart disease Brother   . Breast cancer Neg Hx     Medications:   Current Outpatient Medications on File Prior to Visit  Medication Sig Dispense Refill  . apixaban (ELIQUIS) 5 MG TABS tablet Take 1 tablet (5 mg total) by mouth 2 (two) times daily. 60 tablet 6  . hydrochlorothiazide (MICROZIDE) 12.5 MG capsule Take 1 capsule (12.5 mg total) by mouth daily. 90 capsule 1  . rosuvastatin (CRESTOR) 20 MG tablet Take 1 tablet (20 mg total) by mouth daily. (Patient taking differently: Take 20 mg by mouth daily. Is alternating 10mg  qod then 20mg  qod) 30 tablet 2  . telmisartan (MICARDIS) 80 MG tablet Take 1 tablet (80 mg total) by mouth at bedtime. 90 tablet 1   No current facility-administered medications on file prior to visit.     Allergies:   Allergies  Allergen Reactions  . Wasp Venom Protein Anaphylaxis  . Insect Extract Allergy Skin Test     Insect  Stings     Physical Exam  Vitals:   03/29/19 0832  BP: 124/84  Pulse: 65  Temp: 97.8 F (36.6 C)  TempSrc: Oral  Weight: 214 lb 9.6 oz (97.3 kg)  Height: 5\' 6"  (1.676 m)   Body mass index is 34.64 kg/m. No exam data present   General: well developed, well nourished, pleasant middle-age Caucasian female, seated, in no evident distress Head: head normocephalic and atraumatic.   Neck: supple with no carotid or supraclavicular bruits Cardiovascular: regular rate and rhythm, no murmurs Musculoskeletal: no deformity Skin:  no rash/petichiae Vascular:  Normal pulses all extremities   Neurologic Exam Mental Status: Awake and fully alert.  Mild speech hesitancy. Oriented to place and time. Recent and remote memory intact. Attention span, concentration and fund of knowledge appropriate. Mood and affect appropriate.  Cranial Nerves: Fundoscopic exam reveals sharp disc margins. Pupils equal, briskly reactive to light. Extraocular movements full without nystagmus. Visual fields full to confrontation. Hearing intact.  Face symmetrical. Motor: Normal bulk and tone. Normal strength in all tested extremity muscles. Sensory.: intact to touch , pinprick , position and vibratory sensation.  Coordination: Rapid alternating movements normal in all extremities. Finger-to-nose and heel-to-shin performed accurately bilaterally. Gait and Station: Arises from chair without difficulty. Stance is normal. Gait demonstrates normal stride length and balance Reflexes: 1+ and symmetric. Toes downgoing.       Diagnostic Data (Labs, Imaging, Testing)   MRIright MCA frontal cortex infarct  CTA head and neck unremarkable  2D EchoEF 60-65%. No source of embolus   LE venous Doppler neg DVT  TEE unremarkable, no PFO  Loop recorder placed 11/18/18 to rule out cardioembolic source  [id number]  [id number]    ASSESSMENT: Lisa Green is a 68 y.o. year old female presented with left facial droop and  slurred speech with stroke work-up revealing right MCA frontal cortex infarct embolic pattern on 0000000 secondary to undetermined source therefore loop recorder placed to rule out atrial fibrillation. Vascular risk factors include HTN, HLD, history of DVT without evidence of acute DVT and obesity.  Loop recorder showed evidence of atrial fibrillation and was started on Eliquis.  Residual deficits of very mild dysarthria with reports of near baseline and complete resolution of cognitive impairment.    PLAN:  1.  Right MCA infarct: Continue Eliquis (apixaban) daily  and rosuvastatin 20 mg with use of current schedule for secondary stroke prevention. Maintain strict control of hypertension with blood pressure goal below 130/90, diabetes with hemoglobin A1c goal below 6.5% and cholesterol with LDL cholesterol (bad cholesterol) goal below 70 mg/dL.  I also advised the patient to eat a healthy diet with plenty of whole grains, cereals, fruits and vegetables, exercise regularly with at least 30 minutes of continuous activity daily and maintain ideal body weight.  Continue to monitor loop recorder for atrial fibrillation 2. Atrial fibrillation: New diagnosis with evidence of rhythm on loop recorder.  Continue Eliquis and ongoing follow-up with cardiology for monitoring and management 3. HTN: Advised to continue current treatment regimen.  Today's BP 124/84.  Advised to continue to monitor at home along with continued follow-up with PCP for management 4. HLD: Advised to continue current treatment regimen along with continued follow-up with PCP for future prescribing and monitoring of lipid panel.    Follow up in 6 months or call earlier if needed   Greater than 50% of time during this 25 minute visit was spent on counseling, explanation of diagnosis of right MCA infarct, discussion regarding new diagnosis of atrial fibrillation and Eliquis usage, reviewing risk factor management of HTN and HLD, planning of  further management along with potential future management, and discussion with patient and answering all questions to [place], AGNP-BC  [facility] [address] [place], [place] [address]  Phone [phone] Fax [phone] Note: This document was prepared with digital dictation and possible smart phrase technology. Any transcriptional errors that result from this process are unintentional.

## 2019-03-30 NOTE — Progress Notes (Signed)
I agree with the above plan 

## 2019-04-02 LAB — CUP PACEART REMOTE DEVICE CHECK
Date Time Interrogation Session: 20201101110622
Implantable Pulse Generator Implant Date: 20200619

## 2019-04-03 ENCOUNTER — Ambulatory Visit (INDEPENDENT_AMBULATORY_CARE_PROVIDER_SITE_OTHER): Payer: 59 | Admitting: *Deleted

## 2019-04-03 DIAGNOSIS — I639 Cerebral infarction, unspecified: Secondary | ICD-10-CM

## 2019-04-03 DIAGNOSIS — I480 Paroxysmal atrial fibrillation: Secondary | ICD-10-CM

## 2019-04-03 NOTE — Telephone Encounter (Signed)
Thanks

## 2019-05-01 NOTE — Progress Notes (Signed)
Carelink Summary Report / Loop Recorder 

## 2019-05-05 ENCOUNTER — Ambulatory Visit (INDEPENDENT_AMBULATORY_CARE_PROVIDER_SITE_OTHER): Payer: 59 | Admitting: *Deleted

## 2019-05-05 DIAGNOSIS — I6930 Unspecified sequelae of cerebral infarction: Secondary | ICD-10-CM | POA: Diagnosis not present

## 2019-05-06 LAB — CUP PACEART REMOTE DEVICE CHECK
Date Time Interrogation Session: 20201204095441
Implantable Pulse Generator Implant Date: 20200619

## 2019-05-31 ENCOUNTER — Other Ambulatory Visit: Payer: Self-pay | Admitting: Internal Medicine

## 2019-06-07 ENCOUNTER — Ambulatory Visit (INDEPENDENT_AMBULATORY_CARE_PROVIDER_SITE_OTHER): Payer: 59 | Admitting: *Deleted

## 2019-06-07 DIAGNOSIS — I6930 Unspecified sequelae of cerebral infarction: Secondary | ICD-10-CM

## 2019-06-07 LAB — CUP PACEART REMOTE DEVICE CHECK
Date Time Interrogation Session: 20210106100017
Implantable Pulse Generator Implant Date: 20200619

## 2019-06-26 MED ORDER — HYDROCHLOROTHIAZIDE 12.5 MG PO CAPS: 12.5000 mg | ORAL_CAPSULE | Freq: Every day | ORAL | 1 refills | Status: DC

## 2019-06-26 MED ORDER — ROSUVASTATIN CALCIUM 20 MG PO TABS: 20.0000 mg | ORAL_TABLET | Freq: Every day | ORAL | 1 refills | Status: DC

## 2019-06-26 MED ORDER — TELMISARTAN 80 MG PO TABS: 80.0000 mg | ORAL_TABLET | Freq: Every day | ORAL | 1 refills | Status: DC

## 2019-07-10 ENCOUNTER — Ambulatory Visit (INDEPENDENT_AMBULATORY_CARE_PROVIDER_SITE_OTHER): Payer: 59 | Admitting: *Deleted

## 2019-07-10 DIAGNOSIS — I6930 Unspecified sequelae of cerebral infarction: Secondary | ICD-10-CM

## 2019-07-10 LAB — CUP PACEART REMOTE DEVICE CHECK
Date Time Interrogation Session: 20210208000848
Implantable Pulse Generator Implant Date: 20200619

## 2019-07-11 NOTE — Progress Notes (Signed)
ILR Remote 

## 2019-08-10 ENCOUNTER — Ambulatory Visit (INDEPENDENT_AMBULATORY_CARE_PROVIDER_SITE_OTHER): Payer: 59 | Admitting: *Deleted

## 2019-08-10 DIAGNOSIS — I6930 Unspecified sequelae of cerebral infarction: Secondary | ICD-10-CM | POA: Diagnosis not present

## 2019-08-10 LAB — CUP PACEART REMOTE DEVICE CHECK
Date Time Interrogation Session: 20210311005231
Implantable Pulse Generator Implant Date: 20200619

## 2019-08-11 NOTE — Progress Notes (Signed)
ILR Remote 

## 2019-09-04 ENCOUNTER — Other Ambulatory Visit (HOSPITAL_COMMUNITY): Payer: Self-pay | Admitting: Nurse Practitioner

## 2019-09-10 LAB — CUP PACEART REMOTE DEVICE CHECK
Date Time Interrogation Session: 20210411034943
Implantable Pulse Generator Implant Date: 20200619

## 2019-09-11 ENCOUNTER — Ambulatory Visit (INDEPENDENT_AMBULATORY_CARE_PROVIDER_SITE_OTHER): Payer: 59 | Admitting: *Deleted

## 2019-09-11 DIAGNOSIS — I6930 Unspecified sequelae of cerebral infarction: Secondary | ICD-10-CM

## 2019-09-12 NOTE — Progress Notes (Signed)
ILR Remote 

## 2019-09-14 DIAGNOSIS — Z0120 Encounter for dental examination and cleaning without abnormal findings: Secondary | ICD-10-CM | POA: Diagnosis not present

## 2019-09-14 NOTE — Telephone Encounter (Signed)
I have sent pt a message letting her know that the welcome to medicare visit will need to be scheduled with you at anytime.

## 2019-09-27 ENCOUNTER — Ambulatory Visit: Payer: 59 | Admitting: Adult Health

## 2019-09-27 ENCOUNTER — Other Ambulatory Visit: Payer: Self-pay

## 2019-09-27 ENCOUNTER — Encounter: Payer: Self-pay | Admitting: Adult Health

## 2019-09-27 VITALS — BP 148/96 | HR 66 | Temp 97.6°F | Ht 66.0 in | Wt 222.0 lb

## 2019-09-27 DIAGNOSIS — E785 Hyperlipidemia, unspecified: Secondary | ICD-10-CM

## 2019-09-27 DIAGNOSIS — I480 Paroxysmal atrial fibrillation: Secondary | ICD-10-CM | POA: Diagnosis not present

## 2019-09-27 DIAGNOSIS — R209 Unspecified disturbances of skin sensation: Secondary | ICD-10-CM | POA: Diagnosis not present

## 2019-09-27 DIAGNOSIS — Z8673 Personal history of transient ischemic attack (TIA), and cerebral infarction without residual deficits: Secondary | ICD-10-CM | POA: Diagnosis not present

## 2019-09-27 DIAGNOSIS — I10 Essential (primary) hypertension: Secondary | ICD-10-CM

## 2019-09-27 NOTE — Patient Instructions (Signed)
Continue to monitor your left hand symptoms -if symptoms become more consistent, start to experience weakness or difficulty with coordination, please call office for further evaluation  Continue Eliquis (apixaban) daily  and Crestor 20 mg daily for secondary stroke prevention  Continue to follow up with PCP regarding cholesterol and blood pressure management as well as ongoing prescribing of Eliquis  Continue to monitor blood pressure at home  Maintain strict control of hypertension with blood pressure goal below 130/90, diabetes with hemoglobin A1c goal below 6.5% and cholesterol with LDL cholesterol (bad cholesterol) goal below 70 mg/dL. I also advised the patient to eat a healthy diet with plenty of whole grains, cereals, fruits and vegetables, exercise regularly and maintain ideal body weight.  Overall stable from stroke standpoint recommend follow-up as needed       Thank you for coming to see [place] at [facility]. I hope we have been able to provide you high quality care today.  You may receive a patient satisfaction survey over the next few weeks. We would appreciate your feedback and comments so that we may continue to improve ourselves and the health of our patients.

## 2019-09-27 NOTE — Progress Notes (Signed)
[facility] [address] [place]. [place] [address] [phone]       STROKE FOLLOW UP NOTE  [name] Date of Birth:  [birth year] Medical Record Number:  [id number]   Reason for visit: Right MCA stroke 11/16/2018    CHIEF COMPLAINT:  Chief Complaint  Patient presents with  . Follow-up    Rm 9, alone,  Doing well, L hand numbness intermittently (since last 6-7 months.   . Cerebrovascular Accident    HPI:   Today, 09/27/2019, Lisa Green returns for stroke follow-up.  She has been stable from a stroke standpoint without residual deficits or new/reoccurring stroke/TIA symptoms.  Continues on Eliquis and Crestor for secondary stroke prevention.  Blood pressure today 148/96.  She does report 49-month onset of intermittent decreased left hand sensation typically when "static" and feeling as though "my hand is dead".  Denies numbness/tingling sensation, pain, weakness or ataxia.  Consistent throughout all fingers and palm. No further concerns at this time.    History provided for reference purposes only Update 03/29/2019 JM: Lisa Green is a 69 year old female who is being seen today for stroke follow-up.  Residual deficits of greatly improved with speech close to baseline and no residual cognitive issues.  She returned to work on 01/31/2019 and retired on 03/02/2019 as planned.  She has been greatly enjoying retirement keeping active and doing projects around her house.  Since prior visit, loop recorder showed 8 hours of atrial fibrillation without symptoms.  Eliquis 5 mg twice daily initiated for secondary stroke prevention and discontinued aspirin.  Continues on Eliquis without bleeding or bruising.  Continues on rosuvastatin without myalgias.  Recent LDL 77.  Blood pressure today 124/84.  Previously elevated blood pressures but greatly improved after initiation of hydrochlorothiazide 12.5 mg daily.  Denies new or worsening stroke/TIA symptoms.  Initial visit 12/22/2018  JM: Residual deficits of mild left facial droop, mild memory loss and mild aphasia/ dysarthria but overall continues to improve. She continues HEP as recommended by hospital therapist.  Mild memory loss has been improving with use of compensation strategies with use of Post-it notes for reminders.  Residual symptoms worsen with increased fatigue, increased stress or change of emotion. She is currently on medical leave until 12/30/2018 provided by her PCP.  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  She endorses high-level stress position and large amount of travel as these 19 clinics are spread out throughout 4 different counties.  She is fearful of returning after next week due to increased stress which worsens her cognition and speech which will cause difficulty adequately performing her job duties. Completed 3 weeks DAPT and continues on aspirin alone without bleeding or bruising Continues on rosuvastatin.  Initially experiencing myalgias on 20 mg dose and therefore currently on 10mg  QOD and 20mg  QOD as recommended by her PCP.  She denies reoccurring leg cramps with decreased dose.she has failed use of simvastatin in the past due to inadequate management -Per patient, spoke with PCP about potentially initiating Taylor K9 inhibitor Blood pressure today 151/94 - continues to follow with PCP as she endorses difficulty controlling blood pressures Loop recorder is not shown atrial fibrillation thus far She is questioning potential return to driving Denies new or worsening stroke/TIA symptoms  Stroke admission 11/16/2018: Lisa Green, Lisa presented to [facility] ED on 11/16/2018 for left facial droop and slurred speech. tPA givenat  regional. Stroke  work-up revealed right MCA frontal cortex infarct embolic pattern secondary to unknown source with resultant left facial droop and slurred  speech.  MRI showed right MCA frontal cortex infarct.  Vessel imaging and 2D echo unremarkable.  Lower extremity venous Dopplers negative for DVT.  TEE unremarkable without evidence of PFO.  Loop recorder placed on 11/18/2018 to rule out atrial fibrillation.  LDL 137 A1c 5.4.  Recommended DAPT for 3 weeks and aspirin alone.  Previously on rosuvastatin 10 mg daily for HTN management and recommended increasing dose to 20 mg daily.  Other stroke risk factors include HTN, advanced age, Green and prior history of DVT without evidence of acute DVT.  She was discharged home in stable condition without therapy needs.     ROS:   14 system review of systems performed and negative with exception of no complaints  PMH:  Past Medical History:  Diagnosis Date  . Diverticulosis 2013   colonoscopy, [place]  . Hyperlipidemia   . Green (BMI 30.0-34.9)   . Stroke [facility]) 11/17/2018   R MCA cortical, cryptogenic    PSH:  Past Surgical History:  Procedure Laterality Date  . ABDOMINAL HYSTERECTOMY     with wedge resection of right ovary , normal path  . BUBBLE STUDY  11/18/2018   Procedure: BUBBLE STUDY;  Surgeon: [name], MD;  Location: [facility] ENDOSCOPY;  Service: Cardiovascular;;  . [place]  . CESAREAN SECTION  1983  . COSMETIC SURGERY    . ENDOVENOUS ABLATION SAPHENOUS VEIN W/ LASER  2008   for varicose veins,  [place], [place]  . LOOP RECORDER INSERTION N/A 11/18/2018   Procedure: LOOP RECORDER INSERTION;  Surgeon: Thompson Grayer, MD;  Location: [facility] LAB;  Service: Cardiovascular;  Laterality: N/A;  . TEE WITHOUT CARDIOVERSION N/A 11/18/2018   Procedure: TRANSESOPHAGEAL ECHOCARDIOGRAM (TEE);  Surgeon: [name], MD;  Location: [facility] ENDOSCOPY;  Service: Cardiovascular;  Laterality: N/A;    Social History:  Social History   Socioeconomic History  . Marital status: Married    Spouse name: Not on file  . Number of children: Not on file  . Years of  education: Not on file  . Highest education level: Not on file  Occupational History  . Occupation: Production assistant, radio    Comment: OT at [employer]  . Smoking status: Never Smoker  . Smokeless tobacco: Never Used  Substance and Sexual Activity  . Alcohol use: Yes    Comment: 2 oz red wine daily  . Drug use: No  . Sexual activity: Not on file  Other Topics Concern  . Not on file  Social History Narrative   Retired Arts administrator Health since 03/2019.   Social Determinants of Health   Financial Resource Strain:   . Difficulty of Paying Living Expenses:   Food Insecurity:   . Worried About Charity fundraiser in the Last Year:   . Arboriculturist in the Last Year:   Transportation Needs:   . Film/video editor (Medical):   Marland Kitchen Lack of Transportation (Non-Medical):   Physical Activity:   . Days of Exercise per Week:   . Minutes of Exercise per Session:   Stress:   . Feeling of Stress :   Social Connections:   . Frequency of Communication with Friends and Family:   . Frequency of Social Gatherings with Friends and Family:   . Attends Religious Services:   . Active Member  of Clubs or Organizations:   . Attends Archivist Meetings:   Marland Kitchen Marital Status:   Intimate Partner Violence:   . Fear of Current or Ex-Partner:   . Emotionally Abused:   Marland Kitchen Physically Abused:   . Sexually Abused:     Family History:  Family History  Problem Relation Age of Onset  . Coronary artery disease Father 16  . Stroke Father   . Coronary artery disease Paternal Uncle   . Heart disease Paternal Uncle   . Coronary artery disease Paternal Grandmother   . Heart disease Paternal Grandmother   . Coronary artery disease Paternal Grandfather   . Heart disease Paternal Grandfather   . Heart disease Mother   . Cancer Sister 76       ovarian ca  . Heart disease Brother   . Breast cancer Neg Hx     Medications:   Current Outpatient Medications on File Prior to Visit    Medication Sig Dispense Refill  . ELIQUIS 5 MG TABS tablet TAKE 1 TABLET BY MOUTH 2 TIMES DAILY. 60 tablet 6  . hydrochlorothiazide (MICROZIDE) 12.5 MG capsule Take 1 capsule (12.5 mg total) by mouth daily. 90 capsule 1  . rosuvastatin (CRESTOR) 20 MG tablet Take 1 tablet (20 mg total) by mouth daily. 90 tablet 1  . telmisartan (MICARDIS) 80 MG tablet Take 1 tablet (80 mg total) by mouth at bedtime. 90 tablet 1   No current facility-administered medications on file prior to visit.    Allergies:   Allergies  Allergen Reactions  . Wasp Venom Protein Anaphylaxis  . Insect Extract Allergy Skin Test     Insect Stings     Vitals  Vitals:   09/27/19 0833  BP: (!) 148/96  Pulse: 66  Temp: 97.6 F (36.4 C)  SpO2: 99%  Weight: 222 lb (100.7 kg)  Height: 5\' 6"  (1.676 m)   Body mass index is 35.83 kg/m. No exam data present  Physical exam  General: well developed, well nourished, pleasant middle-age Caucasian female, seated, in no evident distress Head: head normocephalic and atraumatic.   Neck: supple with no carotid or supraclavicular bruits Cardiovascular: regular rate and rhythm, no murmurs Musculoskeletal: no deformity Skin:  no rash/petichiae Vascular:  Normal pulses all extremities   Neurologic Exam Mental Status: Awake and fully alert.  Fluent speech and language. Oriented to place and time. Recent and remote memory intact. Attention span, concentration and fund of knowledge appropriate. Mood and affect appropriate.  Cranial Nerves: Pupils equal, briskly reactive to light. Extraocular movements full without nystagmus. Visual fields full to confrontation. Hearing intact.  Face symmetrical. Motor: Normal bulk and tone. Normal strength in all tested extremity muscles. Sensory.: intact to touch , pinprick , position and vibratory sensation except slight decreased pinprick sensation left hand distally.  Coordination: Rapid alternating movements normal in all extremities.  Finger-to-nose and heel-to-shin performed accurately bilaterally.  No evidence of ataxia or dexterity. Gait and Station: Arises from chair without difficulty. Stance is normal. Gait demonstrates normal stride length and balance Reflexes: 1+ and symmetric. Toes downgoing.       ASSESSMENT: Lisa Green is a 69 y.o. year old female presented with left facial droop and slurred speech with stroke work-up revealing right MCA frontal cortex infarct embolic pattern on 0000000 secondary to undetermined source therefore loop recorder placed to rule out atrial fibrillation which showed evidence of atrial fibrillation therefore Eliquis initiated. Vascular risk factors include HTN, HLD, history of DVT without evidence  of acute DVT and Green.  She does report 28-month onset of intermittent decreased left hand sensation typically occurring when hand is not in motion -denies numbness/tingling, pain, weakness or ataxia.    PLAN:  1. Right MCA infarct: Continue Eliquis (apixaban) daily  and rosuvastatin 20 mg for secondary stroke prevention.  Advised patient of lifelong use of Eliquis for secondary stroke prevention.  Maintain strict control of hypertension with blood pressure goal below 130/90, diabetes with hemoglobin A1c goal below 6.5% and cholesterol with LDL cholesterol (bad cholesterol) goal below 70 mg/dL.  I also advised the patient to eat a healthy diet with plenty of whole grains, cereals, fruits and vegetables, exercise regularly with at least 30 minutes of continuous activity daily and maintain ideal body weight.  Continue to monitor loop recorder for atrial fibrillation 2. Atrial fibrillation: Continuation of Eliquis and ongoing follow-up with PCP for prescribing, monitoring and management 3. HTN: Stable.  Continue to follow with PCP for ongoing monitoring and management 4. HLD: Continuation of Crestor and ongoing follow-up with PCP for prescribing, monitoring and management 5. Left hand symptoms:  Advised to continue to monitor at this time and if worsens with symptoms becoming more consistent or if accompanied by weakness, pain or ataxia and further evaluation recommended at that time.  Patient agreeable to plan. Ddx intermittent nerve impingement vs neuropathy vs sensory neuropathy vs post stroke vs idiopathic   Overall stable from stroke standpoint and recommend follow-up as needed   I spent 26 minutes of face-to-face and non-face-to-face time with patient.  This included previsit chart review, lab review, study review, order entry, electronic health record documentation, patient education regarding prior stroke, new diagnosis of atrial fibrillation with ongoing use of Eliquis, importance of managing stroke risk factors and discussion of left hand symptoms and answered all questions to patient satisfaction     Frann Rider, AGNP-BC  [facility] [address] [place], [place] [address]  Phone [phone] Fax [phone] Note: This document was prepared with digital dictation and possible smart phrase technology. Any transcriptional errors that result from this process are unintentional.

## 2019-10-02 NOTE — Progress Notes (Signed)
I agree with the above plan 

## 2019-10-12 LAB — CUP PACEART REMOTE DEVICE CHECK
Date Time Interrogation Session: 20210512035323
Implantable Pulse Generator Implant Date: 20200619

## 2019-10-16 ENCOUNTER — Ambulatory Visit (INDEPENDENT_AMBULATORY_CARE_PROVIDER_SITE_OTHER): Payer: 59 | Admitting: *Deleted

## 2019-10-16 DIAGNOSIS — I639 Cerebral infarction, unspecified: Secondary | ICD-10-CM

## 2019-10-17 NOTE — Progress Notes (Signed)
Carelink Summary Report / Loop Recorder 

## 2019-11-17 ENCOUNTER — Other Ambulatory Visit: Payer: Self-pay

## 2019-11-17 ENCOUNTER — Encounter: Payer: Self-pay | Admitting: Internal Medicine

## 2019-11-17 ENCOUNTER — Ambulatory Visit (INDEPENDENT_AMBULATORY_CARE_PROVIDER_SITE_OTHER): Payer: Medicare Other | Admitting: Internal Medicine

## 2019-11-17 VITALS — BP 134/88 | HR 70 | Temp 97.6°F | Resp 16 | Ht 66.0 in | Wt 220.6 lb

## 2019-11-17 DIAGNOSIS — I6930 Unspecified sequelae of cerebral infarction: Secondary | ICD-10-CM

## 2019-11-17 DIAGNOSIS — Z0000 Encounter for general adult medical examination without abnormal findings: Secondary | ICD-10-CM | POA: Diagnosis not present

## 2019-11-17 DIAGNOSIS — D6869 Other thrombophilia: Secondary | ICD-10-CM | POA: Diagnosis not present

## 2019-11-17 DIAGNOSIS — Z780 Asymptomatic menopausal state: Secondary | ICD-10-CM

## 2019-11-17 DIAGNOSIS — Z1231 Encounter for screening mammogram for malignant neoplasm of breast: Secondary | ICD-10-CM

## 2019-11-17 DIAGNOSIS — E1169 Type 2 diabetes mellitus with other specified complication: Secondary | ICD-10-CM

## 2019-11-17 DIAGNOSIS — I10 Essential (primary) hypertension: Secondary | ICD-10-CM

## 2019-11-17 DIAGNOSIS — R635 Abnormal weight gain: Secondary | ICD-10-CM | POA: Diagnosis not present

## 2019-11-17 DIAGNOSIS — E785 Hyperlipidemia, unspecified: Secondary | ICD-10-CM

## 2019-11-17 DIAGNOSIS — E669 Obesity, unspecified: Secondary | ICD-10-CM

## 2019-11-17 DIAGNOSIS — E559 Vitamin D deficiency, unspecified: Secondary | ICD-10-CM | POA: Diagnosis not present

## 2019-11-17 LAB — COMPREHENSIVE METABOLIC PANEL
ALT: 30 U/L (ref 0–35)
AST: 24 U/L (ref 0–37)
Albumin: 4.9 g/dL (ref 3.5–5.2)
Alkaline Phosphatase: 68 U/L (ref 39–117)
BUN: 19 mg/dL (ref 6–23)
CO2: 29 mEq/L (ref 19–32)
Calcium: 10.3 mg/dL (ref 8.4–10.5)
Chloride: 103 mEq/L (ref 96–112)
Creatinine, Ser: 0.81 mg/dL (ref 0.40–1.20)
GFR: 70.39 mL/min (ref >60.00)
Glucose, Bld: 107 mg/dL — ABNORMAL HIGH (ref 70–99)
Potassium: 4.3 mEq/L (ref 3.5–5.1)
Sodium: 138 mEq/L (ref 135–145)
Total Bilirubin: 0.6 mg/dL (ref 0.2–1.2)
Total Protein: 7.2 g/dL (ref 6.0–8.3)

## 2019-11-17 LAB — LIPID PANEL
Cholesterol: 167 mg/dL (ref 0–200)
HDL: 47 mg/dL (ref >39.00)
LDL Cholesterol: 96 mg/dL (ref 0–99)
NonHDL: 120.13
Total CHOL/HDL Ratio: 4
Triglycerides: 122 mg/dL (ref 0.0–149.0)
VLDL: 24.4 mg/dL (ref 0.0–40.0)

## 2019-11-17 LAB — CBC WITH DIFFERENTIAL/PLATELET
Basophils Absolute: 0.1 10*3/uL (ref 0.0–0.1)
Basophils Relative: 1.1 % (ref 0.0–3.0)
Eosinophils Absolute: 0.1 10*3/uL (ref 0.0–0.7)
Eosinophils Relative: 1.5 % (ref 0.0–5.0)
HCT: 40.7 % (ref 36.0–46.0)
Hemoglobin: 13.9 g/dL (ref 12.0–15.0)
Lymphocytes Relative: 38.2 % (ref 12.0–46.0)
Lymphs Abs: 1.8 10*3/uL (ref 0.7–4.0)
MCHC: 34.1 g/dL (ref 30.0–36.0)
MCV: 95.1 fl (ref 78.0–100.0)
Monocytes Absolute: 0.3 10*3/uL (ref 0.1–1.0)
Monocytes Relative: 6.3 % (ref 3.0–12.0)
Neutro Abs: 2.5 10*3/uL (ref 1.4–7.7)
Neutrophils Relative %: 52.9 % (ref 43.0–77.0)
Platelets: 227 10*3/uL (ref 150.0–400.0)
RBC: 4.28 Mil/uL (ref 3.87–5.11)
RDW: 12.8 % (ref 11.5–15.5)
WBC: 4.8 10*3/uL (ref 4.0–10.5)

## 2019-11-17 LAB — VITAMIN D 25 HYDROXY (VIT D DEFICIENCY, FRACTURES): VITD: 37.76 ng/mL (ref 30.00–100.00)

## 2019-11-17 LAB — HEMOGLOBIN A1C: Hgb A1c MFr Bld: 5.8 % (ref 4.6–6.5)

## 2019-11-17 MED ORDER — TETANUS-DIPHTHERIA TOXOIDS TD 5-2 LFU IM INJ: 0.5000 mL | INJECTION | Freq: Once | INTRAMUSCULAR | 0 refills | Status: AC

## 2019-11-17 MED ORDER — ZOSTER VAC RECOMB ADJUVANTED 50 MCG/0.5ML IM SUSR
0.5000 mL | Freq: Once | INTRAMUSCULAR | 1 refills | Status: AC
Start: 2019-11-17 — End: 2019-11-17

## 2019-11-17 NOTE — Progress Notes (Signed)
Patient ID: Lisa Green, female    DOB: [birth year]  Age: 69 y.o. MRN: [id number]  The patient is here for her Welcome to Medicare  examination and management of other chronic and acute problems.  This visit occurred during the SARS-CoV-2 public health emergency.  Safety protocols were in place, including screening questions prior to the visit, additional usage of staff PPE, and extensive cleaning of exam room while observing appropriate contact time as indicated for disinfecting solutions.    Patient has received both doses of the available COVID 19 vaccine without complications.  Patient continues to mask when outside of the home except when walking in yard or at safe distances from others .  Patient denies any change in mood or development of unhealthy behaviors resuting from the pandemic's restriction of activities and socialization.     The risk factors are reflected in the social history.  The roster of all physicians providing medical care to patient - is listed in the Snapshot section of the chart.  Activities of daily living:  The patient is 100% independent in all ADLs: dressing, toileting, feeding as well as independent mobility  Home safety : The patient has smoke detectors in the home. They wear seatbelts.  There are no firearms at home. There is no violence in the home.   There is no risks for hepatitis, STDs or HIV. There is no   history of blood transfusion. They have no travel history to infectious disease endemic areas of the world.  The patient has seen their dentist in the last six month. They have seen their eye doctor in the last year. They admit to slight hearing difficulty with regard to whispered voices and some television programs.  They have deferred audiologic testing in the last year.  They do not  have excessive sun exposure. Discussed the need for sun protection: hats, long sleeves and use of sunscreen if there is significant sun exposure.   Diet: the importance of  a healthy diet is discussed. They do have a healthy diet.  The benefits of regular aerobic exercise were discussed. She walks 4 times per week ,  20 minutes.   Depression screen: there are no signs or vegative symptoms of depression- irritability, change in appetite, anhedonia, sadness/tearfullness.  Cognitive assessment: the patient manages all their financial and personal affairs and is actively engaged. They could relate day,date,year and events; recalled 2/3 objects at 3 minutes; performed clock-face test normally.  The following portions of the patient's history were reviewed and updated as appropriate: allergies, current medications, past family history, past medical history,  past surgical history, past social history  and problem list.  Visual acuity was not assessed per patient preference since she has regular follow up with her ophthalmologist. Hearing and body mass index were assessed and reviewed.   During the course of the visit the patient was educated and counseled about appropriate screening and preventive services including : fall prevention , diabetes screening, nutrition counseling, colorectal cancer screening, and recommended immunizations.    CC: The primary encounter diagnosis was Encounter for screening mammogram for breast cancer. Diagnoses of Postmenopausal estrogen deficiency, Sequelae of cerebral infarction, Acquired thrombophilia ([place]), Essential hypertension, Vitamin D deficiency, Weight gain, Hyperlipidemia associated with type 2 diabetes mellitus ([place]), Obesity (BMI 30.0-34.9), and Welcome to Medicare preventive visit were also pertinent to this visit.  Taking 2000 ius of d3 and alt between 10 and 20 mg crestor nightly   Now on eliquis instead of plavix and asa  Due to another episode of PAG detected by Loop recorder    Hypertension: patient checks blood pressure twice weekly at home.  Readings have been for the most part <130/80 at rest . Patient is following a  reduce salt diet most days and is taking medications as prescribed (telmisartan at night,  hctz I nthe morning 0   History Lisa Green has a past medical history of Diverticulosis (2013), Hyperlipidemia, Obesity (BMI 30.0-34.9), and Stroke ([place]) (11/17/2018).   She has a past surgical history that includes Cesarean section (1981); Cesarean section (1983); Abdominal hysterectomy; Cosmetic surgery; Endovenous ablation saphenous vein w/ laser (2008); LOOP RECORDER INSERTION (N/A, 11/18/2018); TEE without cardioversion (N/A, 11/18/2018); and Bubble study (11/18/2018).   Her family history includes Cancer (age of onset: 76) in her sister; Coronary artery disease in her paternal grandfather, paternal grandmother, and paternal uncle; Coronary artery disease (age of onset: 8) in her father; Heart disease in her brother, mother, paternal grandfather, paternal grandmother, and paternal uncle; Stroke in her father.She reports that she has never smoked. She has never used smokeless tobacco. She reports current alcohol use. She reports that she does not use drugs.  Outpatient Medications Prior to Visit  Medication Sig Dispense Refill  . ELIQUIS 5 MG TABS tablet TAKE 1 TABLET BY MOUTH 2 TIMES DAILY. 60 tablet 6  . hydrochlorothiazide (MICROZIDE) 12.5 MG capsule Take 1 capsule (12.5 mg total) by mouth daily. 90 capsule 1  . rosuvastatin (CRESTOR) 20 MG tablet Take 1 tablet (20 mg total) by mouth daily. 90 tablet 1  . telmisartan (MICARDIS) 80 MG tablet Take 1 tablet (80 mg total) by mouth at bedtime. 90 tablet 1   No facility-administered medications prior to visit.    Review of Systems  Patient denies headache, fevers, malaise, unintentional weight loss, skin rash, eye pain, sinus congestion and sinus pain, sore throat, dysphagia,  hemoptysis , cough, dyspnea, wheezing, chest pain, palpitations, orthopnea, edema, abdominal pain, nausea, melena, diarrhea, constipation, flank pain, dysuria, hematuria, urinary   Frequency, nocturia, numbness, tingling, seizures,  Focal weakness, Loss of consciousness,  Tremor, insomnia, depression, anxiety, and suicidal ideation.     Objective:  BP 134/88 (BP Location: Left Arm, Patient Position: Sitting, Cuff Size: Large)   Pulse 70   Temp 97.6 F (36.4 C) (Temporal)   Resp 16   Ht 5\' 6"  (1.676 m)   Wt 220 lb 9.6 oz (100.1 kg)   SpO2 98%   BMI 35.61 kg/m   Physical Exam  General appearance: alert, cooperative and appears stated age Ears: normal TM's and external ear canals both ears Throat: lips, mucosa, and tongue normal; teeth and gums normal Neck: no adenopathy, no carotid bruit, supple, symmetrical, trachea midline and thyroid not enlarged, symmetric, no tenderness/mass/nodules Back: symmetric, no curvature. ROM normal. No CVA tenderness. Lungs: clear to auscultation bilaterally Heart: regular rate and rhythm, S1, S2 normal, no murmur, click, rub or gallop Abdomen: soft, non-tender; bowel sounds normal; no masses,  no organomegaly Pulses: 2+ and symmetric Skin: Skin color, texture, turgor normal. No rashes or lesions Lymph nodes: Cervical, supraclavicular, and axillary nodes normal.  Assessment & Plan:   Problem List Items Addressed This Visit      Unprioritized   Acquired thrombophilia ([place])    Now on Eliquis due to history of embolic CVA and  Recurrent episode of PAF.        Relevant Orders   CBC with Differential/Platelet   Hypertension    Home readings 120-130/70-80.  CONTINUE TELMISARTAN  80 MG AQND HCTZ 12.5 MG IN AM      Obesity (BMI 30.0-34.9)    I have addressed  BMI and recommended wt loss of 10% of body weigh over the next 6 months using a low glycemic index diet and regular exercise a minimum of 5 days per week.        Sequelae of cerebral infarction    STROKE OCCURRED One year ago.  Had 3 months of rehab.  Playing golf two times weekly now and walking 4 days per week.  energy level finally improved/ .  Still wearing the  loop recorder. Had a 7 hour long episode of atrial fib last august.   .  Was started on eliquis.        Welcome to Medicare preventive visit    age appropriate education and counseling updated, referrals for preventative services and immunizations addressed, dietary and smoking counseling addressed, most recent labs reviewed.  I have personally reviewed and have noted:  1) the patient's medical and social history 2) The pt's use of alcohol, tobacco, and illicit drugs 3) The patient's current medications and supplements 4) Functional ability including ADL's, fall risk, home safety risk, hearing and visual impairment 5) Diet and physical activities 6) Evidence for depression or mood disorder 7) The patient's height, weight, and BMI have been recorded in the chart  I have made referrals, and provided counseling and education based on review of the above       Other Visit Diagnoses    Encounter for screening mammogram for breast cancer    -  Primary   Relevant Orders   MM 3D SCREEN BREAST BILATERAL   Postmenopausal estrogen deficiency       Relevant Orders   DG Bone Density   Vitamin D deficiency       Relevant Orders   VITAMIN D 25 Hydroxy (Vit-D Deficiency, Fractures)   Weight gain       Relevant Orders   Comprehensive metabolic panel   Hemoglobin A1c   Hyperlipidemia associated with type 2 diabetes mellitus ([place])       Relevant Orders   Lipid panel      I am having Minal A. Lecompte start on Zoster Vaccine Adjuvanted and tetanus & diphtheria toxoids (adult). I am also having her maintain her hydrochlorothiazide, rosuvastatin, telmisartan, and Eliquis.  Meds ordered this encounter  Medications  . Zoster Vaccine Adjuvanted [facility]) injection    Sig: Inject 0.5 mLs into the muscle once for 1 dose.    Dispense:  1 each    Refill:  1  . tetanus & diphtheria toxoids, adult, (TENIVAC) 5-2 LFU injection    Sig: Inject 0.5 mLs into the muscle once for 1 dose.    Dispense:  0.5  mL    Refill:  0    There are no discontinued medications.  Follow-up: Return in about 6 months (around 05/18/2020).   Crecencio Mc, MD

## 2019-11-17 NOTE — Assessment & Plan Note (Addendum)
Now on Eliquis due to history of embolic CVA and  Recurrent episode of PAF.

## 2019-11-17 NOTE — Patient Instructions (Addendum)
Your annual mammogram has been ordered.  You are encouraged (required) to call to make your appointment at [facility]  You are due for your 10 yr tetanus booster  DEXA ordered     The ShingRx vaccine is now available in local pharmacies and is much more protective than the old one  Zostavax  (it is about 97%  Effective in preventing shingles). .   It is therefore ADVISED for all interested adults over 50 to prevent shingles so I have printed you a prescription for it.  (it requires a 2nd dose 2 to 6 months after the first one) .  It will cause you to have flu  like symptoms for 2 days    Health Maintenance After Age 64 After age 29, you are at a higher risk for certain long-term diseases and infections as well as injuries from falls. Falls are a major cause of broken bones and head injuries in people who are older than age 15. Getting regular preventive care can help to keep you healthy and well. Preventive care includes getting regular testing and making lifestyle changes as recommended by your health care provider. Talk with your health care provider about:  Which screenings and tests you should have. A screening is a test that checks for a disease when you have no symptoms.  A diet and exercise plan that is right for you. What should I know about screenings and tests to prevent falls? Screening and testing are the best ways to find a health problem early. Early diagnosis and treatment give you the best chance of managing medical conditions that are common after age 51. Certain conditions and lifestyle choices may make you more likely to have a fall. Your health care provider may recommend:  Regular vision checks. Poor vision and conditions such as cataracts can make you more likely to have a fall. If you wear glasses, make sure to get your prescription updated if your vision changes.  Medicine review. Work with your health care provider to regularly review all of the medicines  you are taking, including over-the-counter medicines. Ask your health care provider about any side effects that may make you more likely to have a fall. Tell your health care provider if any medicines that you take make you feel dizzy or sleepy.  Osteoporosis screening. Osteoporosis is a condition that causes the bones to get weaker. This can make the bones weak and cause them to break more easily.  Blood pressure screening. Blood pressure changes and medicines to control blood pressure can make you feel dizzy.  Strength and balance checks. Your health care provider may recommend certain tests to check your strength and balance while standing, walking, or changing positions.  Foot health exam. Foot pain and numbness, as well as not wearing proper footwear, can make you more likely to have a fall.  Depression screening. You may be more likely to have a fall if you have a fear of falling, feel emotionally low, or feel unable to do activities that you used to do.  Alcohol use screening. Using too much alcohol can affect your balance and may make you more likely to have a fall. What actions can I take to lower my risk of falls? General instructions  Talk with your health care provider about your risks for falling. Tell your health care provider if: ? You fall. Be sure to tell your health care provider about all falls, even ones that seem minor. ? You feel  dizzy, sleepy, or off-balance.  Take over-the-counter and prescription medicines only as told by your health care provider. These include any supplements.  Eat a healthy diet and maintain a healthy weight. A healthy diet includes low-fat dairy products, low-fat (lean) meats, and fiber from whole grains, beans, and lots of fruits and vegetables. Home safety  Remove any tripping hazards, such as rugs, cords, and clutter.  Install safety equipment such as grab bars in bathrooms and safety rails on stairs.  Keep rooms and walkways  well-lit. Activity   Follow a regular exercise program to stay fit. This will help you maintain your balance. Ask your health care provider what types of exercise are appropriate for you.  If you need a cane or walker, use it as recommended by your health care provider.  Wear supportive shoes that have nonskid soles. Lifestyle  Do not drink alcohol if your health care provider tells you not to drink.  If you drink alcohol, limit how much you have: ? 0-1 drink a day for women. ? 0-2 drinks a day for men.  Be aware of how much alcohol is in your drink. In the [place] one drink equals one typical bottle of beer (12 oz), one-half glass of wine (5 oz), or one shot of hard liquor (1 oz).  Do not use any products that contain nicotine or tobacco, such as cigarettes and e-cigarettes. If you need help quitting, ask your health care provider. Summary  Having a healthy lifestyle and getting preventive care can help to protect your health and wellness after age 21.  Screening and testing are the best way to find a health problem early and help you avoid having a fall. Early diagnosis and treatment give you the best chance for managing medical conditions that are more common for people who are older than age 44.  Falls are a major cause of broken bones and head injuries in people who are older than age 37. Take precautions to prevent a fall at home.  Work with your health care provider to learn what changes you can make to improve your health and wellness and to prevent falls. This information is not intended to replace advice given to you by your health care provider. Make sure you discuss any questions you have with your health care provider. Document Revised: 09/08/2018 Document Reviewed: 03/31/2017 Elsevier Patient Education  2020 [employer].

## 2019-11-17 NOTE — Assessment & Plan Note (Signed)

## 2019-11-17 NOTE — Assessment & Plan Note (Signed)
I have addressed  BMI and recommended wt loss of 10% of body weigh over the next 6 months using a low glycemic index diet and regular exercise a minimum of 5 days per week.   

## 2019-11-17 NOTE — Assessment & Plan Note (Addendum)
STROKE OCCURRED One year ago.  Had 3 months of rehab.  Playing golf two times weekly now and walking 4 days per week.  energy level finally improved/ .  Still wearing the loop recorder. Had a 7 hour long episode of atrial fib last august.   .  Was started on eliquis.

## 2019-11-17 NOTE — Assessment & Plan Note (Addendum)
Home readings 120-130/70-80.  CONTINUE TELMISARTAN  80 MG AQND HCTZ 12.5 MG IN AM

## 2019-11-20 ENCOUNTER — Ambulatory Visit (INDEPENDENT_AMBULATORY_CARE_PROVIDER_SITE_OTHER): Payer: Medicare Other | Admitting: *Deleted

## 2019-11-20 DIAGNOSIS — I639 Cerebral infarction, unspecified: Secondary | ICD-10-CM

## 2019-11-20 LAB — CUP PACEART REMOTE DEVICE CHECK
Date Time Interrogation Session: 20210621001840
Implantable Pulse Generator Implant Date: 20200619

## 2019-11-21 NOTE — Progress Notes (Signed)
Carelink Summary Report / Loop Recorder 

## 2019-11-30 ENCOUNTER — Ambulatory Visit
Admission: RE | Admit: 2019-11-30 | Discharge: 2019-11-30 | Disposition: A | Discharge status: Home / Self Care | Payer: Medicare Other | Source: Ambulatory Visit | Attending: Internal Medicine | Admitting: Internal Medicine

## 2019-11-30 DIAGNOSIS — Z1231 Encounter for screening mammogram for malignant neoplasm of breast: Secondary | ICD-10-CM | POA: Diagnosis not present

## 2019-12-06 ENCOUNTER — Ambulatory Visit
Admission: RE | Admit: 2019-12-06 | Discharge: 2019-12-06 | Disposition: A | Discharge status: Home / Self Care | Payer: Medicare Other | Source: Ambulatory Visit | Attending: Internal Medicine | Admitting: Internal Medicine

## 2019-12-06 DIAGNOSIS — Z780 Asymptomatic menopausal state: Secondary | ICD-10-CM | POA: Diagnosis not present

## 2019-12-06 DIAGNOSIS — M85851 Other specified disorders of bone density and structure, right thigh: Secondary | ICD-10-CM | POA: Diagnosis not present

## 2019-12-09 ENCOUNTER — Encounter: Payer: Self-pay | Admitting: Internal Medicine

## 2019-12-09 DIAGNOSIS — M8580 Other specified disorders of bone density and structure, unspecified site: Secondary | ICD-10-CM | POA: Insufficient documentation

## 2019-12-25 ENCOUNTER — Ambulatory Visit (INDEPENDENT_AMBULATORY_CARE_PROVIDER_SITE_OTHER): Payer: Medicare Other | Admitting: *Deleted

## 2019-12-25 DIAGNOSIS — I639 Cerebral infarction, unspecified: Secondary | ICD-10-CM

## 2019-12-26 LAB — CUP PACEART REMOTE DEVICE CHECK
Date Time Interrogation Session: 20210725233300
Implantable Pulse Generator Implant Date: 20200619

## 2019-12-28 NOTE — Progress Notes (Signed)
Carelink Summary Report / Loop Recorder 

## 2020-01-11 ENCOUNTER — Other Ambulatory Visit: Payer: Self-pay | Admitting: Internal Medicine

## 2020-01-28 LAB — CUP PACEART REMOTE DEVICE CHECK
Date Time Interrogation Session: 20210825235102
Implantable Pulse Generator Implant Date: 20200619

## 2020-01-29 ENCOUNTER — Ambulatory Visit (INDEPENDENT_AMBULATORY_CARE_PROVIDER_SITE_OTHER): Payer: 59 | Admitting: *Deleted

## 2020-01-29 DIAGNOSIS — I639 Cerebral infarction, unspecified: Secondary | ICD-10-CM | POA: Diagnosis not present

## 2020-01-31 NOTE — Progress Notes (Signed)
Carelink Summary Report / Loop Recorder 

## 2020-03-04 ENCOUNTER — Ambulatory Visit (INDEPENDENT_AMBULATORY_CARE_PROVIDER_SITE_OTHER): Payer: 59

## 2020-03-04 DIAGNOSIS — I639 Cerebral infarction, unspecified: Secondary | ICD-10-CM

## 2020-03-04 LAB — CUP PACEART REMOTE DEVICE CHECK
Date Time Interrogation Session: 20210925235003
Implantable Pulse Generator Implant Date: 20200619

## 2020-03-06 NOTE — Progress Notes (Signed)
Carelink Summary Report / Loop Recorder 

## 2020-03-20 DIAGNOSIS — Z0120 Encounter for dental examination and cleaning without abnormal findings: Secondary | ICD-10-CM | POA: Diagnosis not present

## 2020-03-27 ENCOUNTER — Ambulatory Visit (INDEPENDENT_AMBULATORY_CARE_PROVIDER_SITE_OTHER): Payer: 59

## 2020-03-27 DIAGNOSIS — I639 Cerebral infarction, unspecified: Secondary | ICD-10-CM

## 2020-03-27 LAB — CUP PACEART REMOTE DEVICE CHECK
Date Time Interrogation Session: 20211026235810
Implantable Pulse Generator Implant Date: 20200619

## 2020-03-29 ENCOUNTER — Other Ambulatory Visit (HOSPITAL_COMMUNITY): Payer: Self-pay | Admitting: Internal Medicine

## 2020-03-29 ENCOUNTER — Other Ambulatory Visit (HOSPITAL_COMMUNITY): Payer: Self-pay | Admitting: Nurse Practitioner

## 2020-04-01 NOTE — Progress Notes (Signed)
Carelink Summary Report / Loop Recorder 

## 2020-04-02 NOTE — Addendum Note (Signed)
Addended by: Douglass Rivers D on: 04/02/2020 05:08 PM   Modules accepted: Level of Service

## 2020-04-04 ENCOUNTER — Telehealth: Payer: Self-pay

## 2020-04-04 NOTE — Telephone Encounter (Signed)
  ILR alert received 04/04/20 for 1 new AF episode, duration 2 hours 20 minutes, VR's as high as 176 bpm. Patient reports she was sleeping during the episode. States all her previous episodes have been while she is sleeping. Reports no complaints and compliant with all medications including Eliquis 5 mg BID. Advised patient to call with any questions or concerns. Verbalized understanding.

## 2020-04-28 LAB — CUP PACEART REMOTE DEVICE CHECK
Date Time Interrogation Session: 20211126230013
Implantable Pulse Generator Implant Date: 20200619

## 2020-04-29 ENCOUNTER — Ambulatory Visit (INDEPENDENT_AMBULATORY_CARE_PROVIDER_SITE_OTHER): Payer: 59

## 2020-04-29 DIAGNOSIS — I639 Cerebral infarction, unspecified: Secondary | ICD-10-CM

## 2020-05-03 NOTE — Progress Notes (Signed)
Carelink Summary Report / Loop Recorder 

## 2020-05-20 ENCOUNTER — Other Ambulatory Visit: Payer: Self-pay

## 2020-05-20 ENCOUNTER — Encounter: Payer: Self-pay | Admitting: Internal Medicine

## 2020-05-20 ENCOUNTER — Telehealth (INDEPENDENT_AMBULATORY_CARE_PROVIDER_SITE_OTHER): Payer: Medicare Other | Admitting: Internal Medicine

## 2020-05-20 ENCOUNTER — Other Ambulatory Visit (INDEPENDENT_AMBULATORY_CARE_PROVIDER_SITE_OTHER): Payer: Medicare Other

## 2020-05-20 VITALS — BP 128/80 | Ht 65.98 in | Wt 214.0 lb

## 2020-05-20 DIAGNOSIS — E785 Hyperlipidemia, unspecified: Secondary | ICD-10-CM

## 2020-05-20 DIAGNOSIS — R944 Abnormal results of kidney function studies: Secondary | ICD-10-CM

## 2020-05-20 DIAGNOSIS — D6869 Other thrombophilia: Secondary | ICD-10-CM | POA: Diagnosis not present

## 2020-05-20 DIAGNOSIS — K760 Fatty (change of) liver, not elsewhere classified: Secondary | ICD-10-CM

## 2020-05-20 DIAGNOSIS — I6930 Unspecified sequelae of cerebral infarction: Secondary | ICD-10-CM

## 2020-05-20 DIAGNOSIS — I10 Essential (primary) hypertension: Secondary | ICD-10-CM

## 2020-05-20 DIAGNOSIS — I480 Paroxysmal atrial fibrillation: Secondary | ICD-10-CM | POA: Diagnosis not present

## 2020-05-20 LAB — CBC WITH DIFFERENTIAL/PLATELET
Basophils Absolute: 0 10*3/uL (ref 0.0–0.1)
Basophils Relative: 0.7 % (ref 0.0–3.0)
Eosinophils Absolute: 0.1 10*3/uL (ref 0.0–0.7)
Eosinophils Relative: 0.7 % (ref 0.0–5.0)
HCT: 39.9 % (ref 36.0–46.0)
Hemoglobin: 13.6 g/dL (ref 12.0–15.0)
Lymphocytes Relative: 32.1 % (ref 12.0–46.0)
Lymphs Abs: 2.2 10*3/uL (ref 0.7–4.0)
MCHC: 34.1 g/dL (ref 30.0–36.0)
MCV: 93.8 fl (ref 78.0–100.0)
Monocytes Absolute: 0.3 10*3/uL (ref 0.1–1.0)
Monocytes Relative: 4.9 % (ref 3.0–12.0)
Neutro Abs: 4.2 10*3/uL (ref 1.4–7.7)
Neutrophils Relative %: 61.6 % (ref 43.0–77.0)
Platelets: 243 10*3/uL (ref 150.0–400.0)
RBC: 4.25 Mil/uL (ref 3.87–5.11)
RDW: 12.8 % (ref 11.5–15.5)
WBC: 6.8 10*3/uL (ref 4.0–10.5)

## 2020-05-20 LAB — COMPREHENSIVE METABOLIC PANEL
ALT: 34 U/L (ref 0–35)
AST: 27 U/L (ref 0–37)
Albumin: 4.6 g/dL (ref 3.5–5.2)
Alkaline Phosphatase: 68 U/L (ref 39–117)
BUN: 17 mg/dL (ref 6–23)
CO2: 31 mEq/L (ref 19–32)
Calcium: 9.8 mg/dL (ref 8.4–10.5)
Chloride: 100 mEq/L (ref 96–112)
Creatinine, Ser: 1.13 mg/dL (ref 0.40–1.20)
GFR: 50.12 mL/min — ABNORMAL LOW (ref >60.00)
Glucose, Bld: 100 mg/dL — ABNORMAL HIGH (ref 70–99)
Potassium: 3.8 mEq/L (ref 3.5–5.1)
Sodium: 139 mEq/L (ref 135–145)
Total Bilirubin: 0.4 mg/dL (ref 0.2–1.2)
Total Protein: 6.9 g/dL (ref 6.0–8.3)

## 2020-05-20 LAB — HEMOGLOBIN A1C: Hgb A1c MFr Bld: 5.7 % (ref 4.6–6.5)

## 2020-05-20 LAB — LDL CHOLESTEROL, DIRECT: Direct LDL: 97 mg/dL

## 2020-05-20 MED ORDER — HYDROCHLOROTHIAZIDE 25 MG PO TABS: 25.0000 mg | ORAL_TABLET | Freq: Every day | ORAL | 3 refills | Status: DC

## 2020-05-20 NOTE — Assessment & Plan Note (Signed)
Detected by loop recorder implanted after her right mca embolic cva which occurred in 2020.  She had a 2.5 hr asymptomatic nocturnal episode.  She is taking eliquis.  She has deferred a sleep study at this point in time (although her husband who wears CPAP , has commented on her snoring) because the quality of her sleep is good (as reported by her "Sleep Number " bed)

## 2020-05-20 NOTE — Assessment & Plan Note (Addendum)
Managed with Crestor at lower than optimal dose due to persistent myalgias at 20 mg dose.  Currently her direct LDL is < 100; stroke was embolic, with no evidence of atherosclerosis on imaging of brain and carotids.  No changes today   Lab Results  Component Value Date   CHOL 167 11/17/2019   HDL 47.00 11/17/2019   LDLCALC 96 11/17/2019   LDLDIRECT 97.0 05/20/2020   TRIG 122.0 11/17/2019   CHOLHDL 4 11/17/2019

## 2020-05-20 NOTE — Patient Instructions (Signed)
I AM INCREASING YOUR HCTZ DOSE TO 25 MG FOR GOAL BP OF 130/80 OR LESS   PLEASE SCHEDULE A  LAB APPOINTMENT THIS MONTH   CONSIDER SLEEP STUDY IF RECURRENT EPISODES OF NOCTURNAL ATRIAL FIBRILLATION OCCUR

## 2020-05-20 NOTE — Progress Notes (Signed)
Virtual Visit via [place]  This visit type was conducted due to national recommendations for restrictions regarding the COVID-19 pandemic (e.g. social distancing).  This format is felt to be most appropriate for this patient at this time.  All issues noted in this document were discussed and addressed.  No physical exam was performed (except for noted visual exam findings with Video Visits).   I connected with@ on 05/20/20 at  8:00 AM EST by a video enabled telemedicine application  and verified that I am speaking with the correct person using two identifiers. Location patient: home Location provider: work or home office Persons participating in the virtual visit: patient, provider  I discussed the limitations, risks, security and privacy concerns of performing an evaluation and management service by telephone and the availability of in person appointments. I also discussed with the patient that there may be a patient responsible charge related to this service. The patient expressed understanding and agreed to proceed.   Reason for visit: 6 month follow up on fatty liver,  Hypertension,  Recurrent PAF with history of embolic CVA   HPI:  69 yr old female with history of hypertension, hepatic steatosis  embolic CVA right MCA territory in 2020 , recurrent paroxysmal atrial fibrillation detected by 4 yr implantable loop recorder presents for 6 month follow up .  She feels generally well, and denies and symptoms of arrhythmia despite the 2.5 hour run detected by the loop recorder recently. She is sleeping well, staying active and denies falls. 4   Patient has received threedoses of the available COVID 19 vaccine without complications.  Patient continues to mask when outside of the home except when walking in yard or at safe distances from others .  Patient denies any change in mood or development of unhealthy behaviors resuting from the pandemic's restriction of activities and socialization.     Patient is taking her medications as prescribed and notes no adverse effects.  Home BP readings have been done about once per week and are  generally >140/80 .  She is avoiding added salt in her diet and walking regularly about 3 times per week for exercise  .   ROS: See pertinent positives and negatives per HPI.  Past Medical History:  Diagnosis Date  . Diverticulosis 2013   colonoscopy, [place]  . Hyperlipidemia   . Obesity (BMI 30.0-34.9)   . Stroke [facility]) 11/17/2018   R MCA cortical, cryptogenic    Past Surgical History:  Procedure Laterality Date  . ABDOMINAL HYSTERECTOMY     with wedge resection of right ovary , normal path  . BUBBLE STUDY  11/18/2018   Procedure: BUBBLE STUDY;  Surgeon: [name], MD;  Location: [facility] ENDOSCOPY;  Service: Cardiovascular;;  . [place]  . CESAREAN SECTION  1983  . COSMETIC SURGERY    . ENDOVENOUS ABLATION SAPHENOUS VEIN W/ LASER  2008   for varicose veins,  [place], [place]  . LOOP RECORDER INSERTION N/A 11/18/2018   Procedure: LOOP RECORDER INSERTION;  Surgeon: Thompson Grayer, MD;  Location: [facility] LAB;  Service: Cardiovascular;  Laterality: N/A;  . TEE WITHOUT CARDIOVERSION N/A 11/18/2018   Procedure: TRANSESOPHAGEAL ECHOCARDIOGRAM (TEE);  Surgeon: [name], MD;  Location: [facility] ENDOSCOPY;  Service: Cardiovascular;  Laterality: N/A;    Family History  Problem Relation Age of Onset  . Coronary artery disease Father 62  . Stroke Father   . Coronary artery disease Paternal Uncle   .  Heart disease Paternal Uncle   . Coronary artery disease Paternal Grandmother   . Heart disease Paternal Grandmother   . Coronary artery disease Paternal Grandfather   . Heart disease Paternal Grandfather   . Heart disease Mother   . Cancer Sister 32       ovarian ca  . Heart disease Brother   . Breast cancer Neg Hx     SOCIAL HX:  reports that she has never smoked. She has never used smokeless tobacco. She  reports current alcohol use. She reports that she does not use drugs.   Current Outpatient Medications:  .  ELIQUIS 5 MG TABS tablet, TAKE 1 TABLET BY MOUTH 2 TIMES DAILY., Disp: 60 tablet, Rfl: 6 .  rosuvastatin (CRESTOR) 20 MG tablet, TAKE 1 TABLET(20 MG) BY MOUTH DAILY, Disp: 90 tablet, Rfl: 1 .  telmisartan (MICARDIS) 80 MG tablet, TAKE 1 TABLET(80 MG) BY MOUTH AT BEDTIME, Disp: 90 tablet, Rfl: 1 .  hydrochlorothiazide (HYDRODIURIL) 25 MG tablet, Take 1 tablet (25 mg total) by mouth daily., Disp: 90 tablet, Rfl: 3  EXAM:  VITALS per patient if applicable:  GENERAL: alert, oriented, appears well and in no acute distress  HEENT: atraumatic, conjunttiva clear, no obvious abnormalities on inspection of external nose and ears  NECK: normal movements of the head and neck  LUNGS: on inspection no signs of respiratory distress, breathing rate appears normal, no obvious gross SOB, gasping or wheezing  CV: no obvious cyanosis  MS: moves all visible extremities without noticeable abnormality  PSYCH/NEURO: pleasant and cooperative, no obvious depression or anxiety, speech and thought processing grossly intact  ASSESSMENT AND PLAN:  Discussed the following assessment and plan:  Hepatic steatosis - Plan: Comprehensive metabolic panel, Hemoglobin A1c  PAF (paroxysmal atrial fibrillation) (HCC)  Hyperlipidemia LDL goal <70 - Plan: Direct LDL  Acquired thrombophilia ([place]) - Plan: CBC with Differential/Platelet  Sequelae of cerebral infarction  Primary hypertension  Morbid obesity (HCC)  PAF (paroxysmal atrial fibrillation) (HCC) Detected by loop recorder implanted after her right mca embolic cva which occurred in 2020.  She had a 2.5 hr asymptomatic nocturnal episode.  She is taking eliquis.  She has deferred a sleep study at this point in time (although her husband who wears CPAP , has commented on her snoring) because the quality of her sleep is good (as reported by her "Sleep  Number " bed)   Hyperlipidemia LDL goal <70 Managed with Crestor at lower than optimal dose due to persistent myalgias at 20 mg dose.  Currently her direct LDL is < 100; stroke was embolic, with no evidence of atherosclerosis on imaging of brain and carotids.  No changes today   Lab Results  Component Value Date   CHOL 167 11/17/2019   HDL 47.00 11/17/2019   LDLCALC 96 11/17/2019   LDLDIRECT 97.0 05/20/2020   TRIG 122.0 11/17/2019   CHOLHDL 4 11/17/2019    Acquired thrombophilia ([place]) She is tolerating  Eliquis for secondary prevention of  embolic stroke risk  due to paroxysmal atrial fibrillation detected on implantable loop recorder. . Patient has no signs of bleeding and is advised to notify her specialists prior to any procedure that may required suspension of Eliquis .  Lab Results  Component Value Date   WBC 6.8 05/20/2020   HGB 13.6 05/20/2020   HCT 39.9 05/20/2020   MCV 93.8 05/20/2020   PLT 243.0 05/20/2020     Hepatic steatosis Secondary to obesity and lifestyle with prediabetes also noted.  lfts have normalized and a1c is trending down.  She is taking a statin.   HepA/B vaccination recommended .  Will recommend trial of metformin if A1c is > 6.0  Lab Results  Component Value Date   ALT 34 05/20/2020   AST 27 05/20/2020   ALKPHOS 68 05/20/2020   BILITOT 0.4 05/20/2020     Sequelae of cerebral infarction Her dysarthria and facial weakness has resolved   Hypertension Home readings are not at goal.  hctz was increased to 25 mg ; however her renal function has declined compared to 6 months ago.  Will repeat in 2 weeks and if still < 60 will dc hctz.   Morbid obesity ([place]) Complicated by hypertension,  Fatty liver and prediabetes.  I have addressed  BMI and recommended a low glycemic index diet utilizing smaller more frequent meals to increase metabolism.  I have also recommended that patient start exercising with a goal of 30 minutes of aerobic exercise a minimum  of 5 days per week.   Lab Results  Component Value Date   HGBA1C 5.7 05/20/2020       I discussed the assessment and treatment plan with the patient. The patient was provided an opportunity to ask questions and all were answered. The patient agreed with the plan and demonstrated an understanding of the instructions.   The patient was advised to call back or seek an in-person evaluation if the symptoms worsen or if the condition fails to improve as anticipated.  I provided 30 minutes of face-to-face time during this encounter.   Crecencio Mc, MD

## 2020-05-20 NOTE — Assessment & Plan Note (Addendum)
She is tolerating  Eliquis for secondary prevention of  embolic stroke risk  due to paroxysmal atrial fibrillation detected on implantable loop recorder. . Patient has no signs of bleeding and is advised to notify her specialists prior to any procedure that may required suspension of Eliquis .  Lab Results  Component Value Date   WBC 6.8 05/20/2020   HGB 13.6 05/20/2020   HCT 39.9 05/20/2020   MCV 93.8 05/20/2020   PLT 243.0 05/20/2020

## 2020-05-22 NOTE — Assessment & Plan Note (Signed)
Her dysarthria and facial weakness has resolved

## 2020-05-22 NOTE — Addendum Note (Signed)
Addended by: Crecencio Mc on: 05/22/2020 12:22 PM   Modules accepted: Orders

## 2020-05-22 NOTE — Progress Notes (Signed)
Lisa Green,  Your labs are fine with the following exception:   The slight drop in kidney function is new and may be due to the hctz since it is a diuretic,  or due to inadequate hydration  on the morning you had the labs drawn. You do not need any medication changes, but I would like to repeat the non fasting test in 2 weeks,  on a day when you are well hydrated. (remember that 60 ounces of water per day is your minimum necessary intake ). Please call the office to schedule this lab appt at your convenience   Regards,   Deborra Medina, MD

## 2020-05-22 NOTE — Assessment & Plan Note (Signed)
Complicated by hypertension,  Fatty liver and prediabetes.  I have addressed  BMI and recommended a low glycemic index diet utilizing smaller more frequent meals to increase metabolism.  I have also recommended that patient start exercising with a goal of 30 minutes of aerobic exercise a minimum of 5 days per week.   Lab Results  Component Value Date   HGBA1C 5.7 05/20/2020

## 2020-05-22 NOTE — Assessment & Plan Note (Signed)
Secondary to obesity and lifestyle with prediabetes also noted.   lfts have normalized and a1c is trending down.  She is taking a statin.   HepA/B vaccination recommended .  Will recommend trial of metformin if A1c is > 6.0  Lab Results  Component Value Date   ALT 34 05/20/2020   AST 27 05/20/2020   ALKPHOS 68 05/20/2020   BILITOT 0.4 05/20/2020

## 2020-05-22 NOTE — Assessment & Plan Note (Signed)
Home readings are not at goal.  hctz was increased to 25 mg ; however her renal function has declined compared to 6 months ago.  Will repeat in 2 weeks and if still < 60 will dc hctz.

## 2020-05-30 LAB — CUP PACEART REMOTE DEVICE CHECK
Date Time Interrogation Session: 20211229230820
Implantable Pulse Generator Implant Date: 20200619

## 2020-06-03 ENCOUNTER — Ambulatory Visit (INDEPENDENT_AMBULATORY_CARE_PROVIDER_SITE_OTHER): Payer: Medicare Other

## 2020-06-03 DIAGNOSIS — I480 Paroxysmal atrial fibrillation: Secondary | ICD-10-CM

## 2020-06-18 NOTE — Progress Notes (Signed)
Carelink Summary Report / Loop Recorder 

## 2020-07-03 LAB — CUP PACEART REMOTE DEVICE CHECK
Date Time Interrogation Session: 20220131235434
Implantable Pulse Generator Implant Date: 20200619

## 2020-07-08 ENCOUNTER — Ambulatory Visit (INDEPENDENT_AMBULATORY_CARE_PROVIDER_SITE_OTHER): Payer: Medicare Other

## 2020-07-08 ENCOUNTER — Other Ambulatory Visit: Payer: Self-pay | Admitting: Internal Medicine

## 2020-07-08 DIAGNOSIS — I639 Cerebral infarction, unspecified: Secondary | ICD-10-CM | POA: Diagnosis not present

## 2020-07-15 NOTE — Progress Notes (Signed)
Carelink Summary Report / Loop Recorder 

## 2020-08-12 ENCOUNTER — Ambulatory Visit (INDEPENDENT_AMBULATORY_CARE_PROVIDER_SITE_OTHER): Payer: Medicare Other

## 2020-08-12 DIAGNOSIS — I639 Cerebral infarction, unspecified: Secondary | ICD-10-CM

## 2020-08-12 LAB — CUP PACEART REMOTE DEVICE CHECK
Date Time Interrogation Session: 20220305235159
Implantable Pulse Generator Implant Date: 20200619

## 2020-08-13 ENCOUNTER — Encounter: Payer: Self-pay | Admitting: Adult Health

## 2020-08-13 NOTE — Telephone Encounter (Signed)
I believe so but I would advise her to contact [employer] to ensure

## 2020-08-20 NOTE — Progress Notes (Signed)
Carelink Summary Report / Loop Recorder 

## 2020-09-06 ENCOUNTER — Other Ambulatory Visit: Payer: Self-pay

## 2020-09-06 MED FILL — Apixaban Tab 5 MG: ORAL | 30 days supply | Qty: 60 | Fill #0 | Status: AC

## 2020-09-07 LAB — CUP PACEART REMOTE DEVICE CHECK
Date Time Interrogation Session: 20220408010255
Implantable Pulse Generator Implant Date: 20200619

## 2020-09-27 ENCOUNTER — Other Ambulatory Visit (HOSPITAL_COMMUNITY): Payer: Self-pay

## 2020-10-07 ENCOUNTER — Other Ambulatory Visit: Payer: Self-pay

## 2020-10-07 MED FILL — Apixaban Tab 5 MG: ORAL | 30 days supply | Qty: 60 | Fill #1 | Status: AC

## 2020-10-09 ENCOUNTER — Ambulatory Visit (INDEPENDENT_AMBULATORY_CARE_PROVIDER_SITE_OTHER): Payer: Medicare Other

## 2020-10-09 DIAGNOSIS — I639 Cerebral infarction, unspecified: Secondary | ICD-10-CM

## 2020-10-09 LAB — CUP PACEART REMOTE DEVICE CHECK
Date Time Interrogation Session: 20220511010423
Implantable Pulse Generator Implant Date: 20200619

## 2020-10-31 NOTE — Progress Notes (Signed)
Carelink Summary Report / Loop Recorder 

## 2020-11-07 ENCOUNTER — Other Ambulatory Visit: Payer: Self-pay | Admitting: Internal Medicine

## 2020-11-07 ENCOUNTER — Other Ambulatory Visit: Payer: Self-pay

## 2020-11-07 MED FILL — Apixaban Tab 5 MG: ORAL | 30 days supply | Qty: 60 | Fill #0 | Status: AC

## 2020-11-11 ENCOUNTER — Ambulatory Visit (INDEPENDENT_AMBULATORY_CARE_PROVIDER_SITE_OTHER): Payer: Medicare Other

## 2020-11-11 DIAGNOSIS — I480 Paroxysmal atrial fibrillation: Secondary | ICD-10-CM | POA: Diagnosis not present

## 2020-11-12 LAB — CUP PACEART REMOTE DEVICE CHECK
Date Time Interrogation Session: 20220613012456
Implantable Pulse Generator Implant Date: 20200619

## 2020-11-29 ENCOUNTER — Telehealth: Payer: Self-pay | Admitting: Internal Medicine

## 2020-11-29 NOTE — Telephone Encounter (Signed)
Left message for patient to call back and schedule Medicare Annual Wellness Visit (AWV) in office.   If not able to come in office, please offer to do virtually or by telephone.   Due for AWVI  Please schedule at anytime with Nurse Health Advisor.   

## 2020-12-03 NOTE — Progress Notes (Signed)
Carelink Summary Report / Loop Recorder 

## 2020-12-06 ENCOUNTER — Other Ambulatory Visit: Payer: Self-pay

## 2020-12-06 MED FILL — Apixaban Tab 5 MG: ORAL | 30 days supply | Qty: 60 | Fill #1 | Status: AC

## 2020-12-16 ENCOUNTER — Ambulatory Visit (INDEPENDENT_AMBULATORY_CARE_PROVIDER_SITE_OTHER): Payer: Medicare Other

## 2020-12-16 DIAGNOSIS — I639 Cerebral infarction, unspecified: Secondary | ICD-10-CM

## 2020-12-17 LAB — CUP PACEART REMOTE DEVICE CHECK
Date Time Interrogation Session: 20220716012628
Implantable Pulse Generator Implant Date: 20200619

## 2020-12-31 ENCOUNTER — Encounter: Payer: Self-pay | Admitting: Internal Medicine

## 2020-12-31 ENCOUNTER — Other Ambulatory Visit: Payer: Self-pay

## 2020-12-31 ENCOUNTER — Ambulatory Visit (INDEPENDENT_AMBULATORY_CARE_PROVIDER_SITE_OTHER): Payer: Medicare Other | Admitting: Internal Medicine

## 2020-12-31 VITALS — BP 120/82 | HR 82 | Temp 96.5°F | Ht 65.0 in | Wt 214.0 lb

## 2020-12-31 DIAGNOSIS — Z1231 Encounter for screening mammogram for malignant neoplasm of breast: Secondary | ICD-10-CM

## 2020-12-31 DIAGNOSIS — D6869 Other thrombophilia: Secondary | ICD-10-CM | POA: Diagnosis not present

## 2020-12-31 DIAGNOSIS — Z0000 Encounter for general adult medical examination without abnormal findings: Secondary | ICD-10-CM | POA: Diagnosis not present

## 2020-12-31 DIAGNOSIS — K760 Fatty (change of) liver, not elsewhere classified: Secondary | ICD-10-CM

## 2020-12-31 DIAGNOSIS — I6930 Unspecified sequelae of cerebral infarction: Secondary | ICD-10-CM | POA: Diagnosis not present

## 2020-12-31 DIAGNOSIS — R7303 Prediabetes: Secondary | ICD-10-CM

## 2020-12-31 DIAGNOSIS — E785 Hyperlipidemia, unspecified: Secondary | ICD-10-CM

## 2020-12-31 DIAGNOSIS — I10 Essential (primary) hypertension: Secondary | ICD-10-CM

## 2020-12-31 MED ORDER — TETANUS-DIPHTH-ACELL PERTUSSIS 5-2.5-18.5 LF-MCG/0.5 IM SUSY: 0.5000 mL | PREFILLED_SYRINGE | Freq: Once | INTRAMUSCULAR | 0 refills | Status: AC

## 2020-12-31 MED ORDER — APIXABAN 5 MG PO TABS
ORAL_TABLET | ORAL | 1 refills | Status: DC
  Filled 2021-01-07: qty 60, 30d supply, fill #0
  Filled 2021-02-11: qty 60, 30d supply, fill #1
  Filled 2021-03-10: qty 60, 30d supply, fill #2
  Filled 2021-06-03: qty 60, 30d supply, fill #3
  Filled 2021-07-07: qty 60, 30d supply, fill #4
  Filled 2021-08-04: qty 60, 30d supply, fill #5

## 2020-12-31 NOTE — Progress Notes (Signed)
Patient ID: Lisa Green, female    DOB: [birth year]  Age: 70 y.o. MRN: [id number]  The patient is here for annual preventive  examination and management of other chronic and acute problems.   The risk factors are reflected in the social history.  The roster of all physicians providing medical care to patient - is listed in the Snapshot section of the chart.  Activities of daily living:  The patient is 100% independent in all ADLs: dressing, toileting, feeding as well as independent mobility  Home safety : The patient has smoke detectors in the home. They wear seatbelts.  There are no firearms at home. There is no violence in the home.   There is no risks for hepatitis, STDs or HIV. There is no   history of blood transfusion. They have no travel history to infectious disease endemic areas of the world.  The patient has seen their dentist in the last six month. They have seen their eye doctor in the last year. She denies  hearing difficulty with regard to whispered voices and some television programs.  They have deferred audiologic testing in the last year.  They do not  have excessive sun exposure. Discussed the need for sun protection: hats, long sleeves and use of sunscreen if there is significant sun exposure.   Diet: the importance of a healthy diet is discussed. They do have a healthy diet.  The benefits of regular aerobic exercise were discussed. She walks 4 times per week ,  20 minutes.   Depression screen: there are no signs or vegative symptoms of depression- irritability, change in appetite, anhedonia, sadness/tearfullness.  Cognitive assessment: the patient manages all their financial and personal affairs and is actively engaged. They could relate day,date,year and events; recalled 2/3 objects at 3 minutes; performed clock-face test normally.  The following portions of the patient's history were reviewed and updated as appropriate: allergies, current medications, past family history,  past medical history,  past surgical history, past social history  and problem list.  Visual acuity was not assessed per patient preference since she has regular follow up with her ophthalmologist. Hearing and body mass index were assessed and reviewed.   During the course of the visit the patient was educated and counseled about appropriate screening and preventive services including : fall prevention , diabetes screening, nutrition counseling, colorectal cancer screening, and recommended immunizations.    CC: The primary encounter diagnosis was Hepatic steatosis. Diagnoses of Hyperlipidemia LDL goal <70, Primary hypertension, Acquired thrombophilia ([place]), Breast cancer screening by mammogram, Sequelae of cerebral infarction, Morbid obesity ([place]), Prediabetes, and Encounter for preventive health examination were also pertinent to this visit.  1) post stroke survivor : now 2 years out.  Only remaining symptom is left hand numbness without weakness   History Lisa Green has a past medical history of Diverticulosis (2013), Hyperlipidemia, Obesity (BMI 30.0-34.9), and Stroke ([place]) (11/17/2018).   She has a past surgical history that includes Cesarean section (1981); Cesarean section (1983); Abdominal hysterectomy; Cosmetic surgery; Endovenous ablation saphenous vein w/ laser (2008); LOOP RECORDER INSERTION (N/A, 11/18/2018); TEE without cardioversion (N/A, 11/18/2018); and Bubble study (11/18/2018).   Her family history includes Cancer (age of onset: 39) in her sister; Coronary artery disease in her paternal grandfather, paternal grandmother, and paternal uncle; Coronary artery disease (age of onset: 95) in her father; Heart disease in her brother, mother, paternal grandfather, paternal grandmother, and paternal uncle; Stroke in her father.She reports that she has never smoked. She has never used  smokeless tobacco. She reports current alcohol use. She reports that she does not use drugs.  Outpatient Medications  Prior to Visit  Medication Sig Dispense Refill   hydrochlorothiazide (HYDRODIURIL) 25 MG tablet Take 1 tablet (25 mg total) by mouth daily. 90 tablet 3   rosuvastatin (CRESTOR) 20 MG tablet TAKE 1 TABLET(20 MG) BY MOUTH DAILY 90 tablet 1   telmisartan (MICARDIS) 80 MG tablet TAKE 1 TABLET(80 MG) BY MOUTH AT BEDTIME 90 tablet 1   apixaban (ELIQUIS) 5 MG TABS tablet TAKE 1 TABLET BY MOUTH 2 TIMES DAILY. 60 tablet 6   hydrochlorothiazide (MICROZIDE) 12.5 MG capsule TAKE 1 CAPSULE(12.5 MG) BY MOUTH DAILY 90 capsule 1   No facility-administered medications prior to visit.    Review of Systems  Patient denies headache, fevers, malaise, unintentional weight loss, skin rash, eye pain, sinus congestion and sinus pain, sore throat, dysphagia,  hemoptysis , cough, dyspnea, wheezing, chest pain, palpitations, orthopnea, edema, abdominal pain, nausea, melena, diarrhea, constipation, flank pain, dysuria, hematuria, urinary  Frequency, nocturia, numbness, tingling, seizures,  Focal weakness, Loss of consciousness,  Tremor, insomnia, depression, anxiety, and suicidal ideation.     Objective:  BP 120/82 (BP Location: Left Arm, Patient Position: Sitting, Cuff Size: Large)   Pulse 82   Temp (!) 96.5 F (35.8 C) (Temporal)   Ht '5\' 5"'$  (1.651 m)   Wt 214 lb (97.1 kg)   SpO2 98%   BMI 35.61 kg/m   Physical Exam  General appearance: alert, cooperative and appears stated age Head: Normocephalic, without obvious abnormality, atraumatic Eyes: conjunctivae/corneas clear. PERRL, EOM's intact. Fundi benign. Ears: normal TM's and external ear canals both ears Nose: Nares normal. Septum midline. Mucosa normal. No drainage or sinus tenderness. Throat: lips, mucosa, and tongue normal; teeth and gums normal Neck: no adenopathy, no carotid bruit, no JVD, supple, symmetrical, trachea midline and thyroid not enlarged, symmetric, no tenderness/mass/nodules Lungs: clear to auscultation bilaterally Breasts: normal  appearance, no masses or tenderness Heart: regular rate and rhythm, S1, S2 normal, no murmur, click, rub or gallop Abdomen: soft, non-tender; bowel sounds normal; no masses,  no organomegaly Extremities: extremities normal, atraumatic, no cyanosis or edema Pulses: 2+ and symmetric Skin: Skin color, texture, turgor normal. No rashes or lesions Neurologic: Alert and oriented X 3, normal strength and tone. Normal symmetric reflexes. Normal coordination and gait.     Assessment & Plan:   Problem List Items Addressed This Visit       Unprioritized   Hepatic steatosis - Primary    Secondary to obesity and lifestyle with prediabetes also noted.   lfts have normalized.  She is taking a statin.   HepA/B vaccination again recommended .  Will recommend trial of metformin vs ozempic   For  A1c  > 6.0  Lab Results  Component Value Date   ALT 32 12/31/2020   AST 23 12/31/2020   ALKPHOS 63 12/31/2020   BILITOT 0.5 12/31/2020         Relevant Orders   Comprehensive metabolic panel (Completed)   Hemoglobin A1c (Completed)   Hyperlipidemia LDL goal <70    Managed with Crestor at lower than optimal dose due to persistent myalgias at 20 mg dose.  Currently her direct LDL  Is 80; stroke was embolic, with no evidence of atherosclerosis on imaging of brain and carotids.  No changes today   Lab Results  Component Value Date   CHOL 151 12/31/2020   HDL 35.90 (L) 12/31/2020   LDLCALC 96 11/17/2019  LDLDIRECT 80.0 12/31/2020   TRIG 268.0 (H) 12/31/2020   CHOLHDL 4 12/31/2020        Relevant Medications   apixaban (ELIQUIS) 5 MG TABS tablet   Other Relevant Orders   Lipid panel (Completed)   Encounter for preventive health examination    age appropriate education and counseling updated, referrals for preventative services and immunizations addressed, dietary and smoking counseling addressed, most recent labs reviewed.  I have personally reviewed and have noted:   1) the patient's medical and  social history 2) The pt's use of alcohol, tobacco, and illicit drugs 3) The patient's current medications and supplements 4) Functional ability including ADL's, fall risk, home safety risk, hearing and visual impairment 5) Diet and physical activities 6) Evidence for depression or mood disorder 7) The patient's height, weight, and BMI have been recorded in the chart   I have made referrals, and provided counseling and education based on review of the above       Hypertension    Well controlled on current regimen on telmisartan and hctz.  Renal function stable, no changes today.  Lab Results  Component Value Date   CREATININE 0.86 12/31/2020   Lab Results  Component Value Date   NA 140 12/31/2020   K 4.0 12/31/2020   CL 101 12/31/2020   CO2 29 12/31/2020         Relevant Medications   apixaban (ELIQUIS) 5 MG TABS tablet   Sequelae of cerebral infarction    Occurred June 2020.. symptoms currently limited to numbness of left hand.        Acquired thrombophilia ([place])    She is tolerating  Eliquis for secondary prevention of  embolic stroke risk  due to paroxysmal atrial fibrillation detected on implantable loop recorder. . Patient has no signs of bleeding and is advised to notify her specialists prior to any procedure that may required suspension of Eliquis .  Lab Results  Component Value Date   WBC 5.8 12/31/2020   HGB 13.4 12/31/2020   HCT 39.2 12/31/2020   MCV 95.8 12/31/2020   PLT 232.0 12/31/2020         Relevant Orders   CBC with Differential/Platelet (Completed)   Morbid obesity ([place])    Complicated by hypertension,  Fatty liver and prediabetes.  I have addressed  BMI and recommended a low glycemic index diet utilizing smaller more frequent meals to increase metabolism.  I have also recommended that patient start exercising with a goal of 30 minutes of aerobic exercise a minimum of 5 days per week.   Lab Results  Component Value Date   HGBA1C 6.0  12/31/2020         Prediabetes    a1c has risen to 6.0  Will recommend that she consider Ozempic given conurrnet obesity and fatty liver.  Continue statin as well.  Lab Results  Component Value Date   HGBA1C 6.0 12/31/2020         Other Visit Diagnoses     Breast cancer screening by mammogram       Relevant Orders   MM DIGITAL SCREENING BILATERAL       I am having Lisa Green start on Tdap. I am also having her maintain her hydrochlorothiazide, telmisartan, rosuvastatin, and apixaban.  Meds ordered this encounter  Medications   Tdap (BOOSTRIX) 5-2.5-18.5 LF-MCG/0.5 injection    Sig: Inject 0.5 mLs into the muscle once for 1 dose.    Dispense:  0.5 mL  Refill:  0   apixaban (ELIQUIS) 5 MG TABS tablet    Sig: TAKE 1 TABLET BY MOUTH 2 TIMES DAILY.    Dispense:  180 tablet    Refill:  1    Medications Discontinued During This Encounter  Medication Reason   hydrochlorothiazide (MICROZIDE) 12.5 MG capsule    apixaban (ELIQUIS) 5 MG TABS tablet Reorder    Follow-up: No follow-ups on file.   Crecencio Mc, MD

## 2020-12-31 NOTE — Patient Instructions (Signed)
Your annual mammogram has been ordered.  You are encouraged (required) to call to make your appointment at [facility]   You    need your tetanus-diptheria-pertussis vaccine (TDaP) but you can get it for less $$$ at a local pharmacy with the script I have provided you.

## 2021-01-01 ENCOUNTER — Telehealth: Payer: Self-pay | Admitting: Internal Medicine

## 2021-01-01 LAB — CBC WITH DIFFERENTIAL/PLATELET
Basophils Absolute: 0.1 10*3/uL (ref 0.0–0.1)
Basophils Relative: 1 % (ref 0.0–3.0)
Eosinophils Absolute: 0.1 10*3/uL (ref 0.0–0.7)
Eosinophils Relative: 1.6 % (ref 0.0–5.0)
HCT: 39.2 % (ref 36.0–46.0)
Hemoglobin: 13.4 g/dL (ref 12.0–15.0)
Lymphocytes Relative: 36.1 % (ref 12.0–46.0)
Lymphs Abs: 2.1 10*3/uL (ref 0.7–4.0)
MCHC: 34.3 g/dL (ref 30.0–36.0)
MCV: 95.8 fl (ref 78.0–100.0)
Monocytes Absolute: 0.4 10*3/uL (ref 0.1–1.0)
Monocytes Relative: 6.9 % (ref 3.0–12.0)
Neutro Abs: 3.1 10*3/uL (ref 1.4–7.7)
Neutrophils Relative %: 54.4 % (ref 43.0–77.0)
Platelets: 232 10*3/uL (ref 150.0–400.0)
RBC: 4.09 Mil/uL (ref 3.87–5.11)
RDW: 13 % (ref 11.5–15.5)
WBC: 5.8 10*3/uL (ref 4.0–10.5)

## 2021-01-01 LAB — COMPREHENSIVE METABOLIC PANEL
ALT: 32 U/L (ref 0–35)
AST: 23 U/L (ref 0–37)
Albumin: 4.6 g/dL (ref 3.5–5.2)
Alkaline Phosphatase: 63 U/L (ref 39–117)
BUN: 14 mg/dL (ref 6–23)
CO2: 29 mEq/L (ref 19–32)
Calcium: 10.1 mg/dL (ref 8.4–10.5)
Chloride: 101 mEq/L (ref 96–112)
Creatinine, Ser: 0.86 mg/dL (ref 0.40–1.20)
GFR: 69.25 mL/min (ref >60.00)
Glucose, Bld: 96 mg/dL (ref 70–99)
Potassium: 4 mEq/L (ref 3.5–5.1)
Sodium: 140 mEq/L (ref 135–145)
Total Bilirubin: 0.5 mg/dL (ref 0.2–1.2)
Total Protein: 7 g/dL (ref 6.0–8.3)

## 2021-01-01 LAB — HEMOGLOBIN A1C: Hgb A1c MFr Bld: 6 % (ref 4.6–6.5)

## 2021-01-01 LAB — LIPID PANEL
Cholesterol: 151 mg/dL (ref 0–200)
HDL: 35.9 mg/dL — ABNORMAL LOW (ref >39.00)
NonHDL: 115.22
Total CHOL/HDL Ratio: 4
Triglycerides: 268 mg/dL — ABNORMAL HIGH (ref 0.0–149.0)
VLDL: 53.6 mg/dL — ABNORMAL HIGH (ref 0.0–40.0)

## 2021-01-01 LAB — LDL CHOLESTEROL, DIRECT: Direct LDL: 80 mg/dL

## 2021-01-01 NOTE — Telephone Encounter (Signed)
Left message for patient to call back and schedule Medicare Annual Wellness Visit (AWV) in office.   If not able to come in office, please offer to do virtually or by telephone.   Due for AWVI  Please schedule at anytime with Nurse Health Advisor.   

## 2021-01-02 DIAGNOSIS — R7303 Prediabetes: Secondary | ICD-10-CM | POA: Insufficient documentation

## 2021-01-02 NOTE — Assessment & Plan Note (Signed)
a1c has risen to 6.0  Will recommend that she consider Ozempic given conurrnet obesity and fatty liver.  Continue statin as well.  Lab Results  Component Value Date   HGBA1C 6.0 12/31/2020

## 2021-01-02 NOTE — Assessment & Plan Note (Signed)

## 2021-01-02 NOTE — Assessment & Plan Note (Signed)
She is tolerating  Eliquis for secondary prevention of  embolic stroke risk  due to paroxysmal atrial fibrillation detected on implantable loop recorder. . Patient has no signs of bleeding and is advised to notify her specialists prior to any procedure that may required suspension of Eliquis .  Lab Results  Component Value Date   WBC 5.8 12/31/2020   HGB 13.4 12/31/2020   HCT 39.2 12/31/2020   MCV 95.8 12/31/2020   PLT 232.0 12/31/2020

## 2021-01-02 NOTE — Assessment & Plan Note (Addendum)
Secondary to obesity and lifestyle with prediabetes also noted.   lfts have normalized.  She is taking a statin.   HepA/B vaccination again recommended .  Will recommend trial of metformin vs ozempic   For  A1c  > 6.0  Lab Results  Component Value Date   ALT 32 12/31/2020   AST 23 12/31/2020   ALKPHOS 63 12/31/2020   BILITOT 0.5 12/31/2020    

## 2021-01-02 NOTE — Assessment & Plan Note (Signed)
Occurred June 2020.. symptoms currently limited to numbness of left hand.

## 2021-01-02 NOTE — Assessment & Plan Note (Signed)
Well controlled on current regimen on telmisartan and hctz.  Renal function stable, no changes today.  Lab Results  Component Value Date   CREATININE 0.86 12/31/2020   Lab Results  Component Value Date   NA 140 12/31/2020   K 4.0 12/31/2020   CL 101 12/31/2020   CO2 29 12/31/2020

## 2021-01-02 NOTE — Assessment & Plan Note (Signed)
Complicated by hypertension,  Fatty liver and prediabetes.  I have addressed  BMI and recommended a low glycemic index diet utilizing smaller more frequent meals to increase metabolism.  I have also recommended that patient start exercising with a goal of 30 minutes of aerobic exercise a minimum of 5 days per week.   Lab Results  Component Value Date   HGBA1C 6.0 12/31/2020    

## 2021-01-02 NOTE — Assessment & Plan Note (Addendum)
Managed with Crestor at lower than optimal dose due to persistent myalgias at 20 mg dose.  Currently her direct LDL  Is 80; stroke was embolic, with no evidence of atherosclerosis on imaging of brain and carotids.  No changes today   Lab Results  Component Value Date   CHOL 151 12/31/2020   HDL 35.90 (L) 12/31/2020   LDLCALC 96 11/17/2019   LDLDIRECT 80.0 12/31/2020   TRIG 268.0 (H) 12/31/2020   CHOLHDL 4 12/31/2020

## 2021-01-06 ENCOUNTER — Other Ambulatory Visit: Payer: Self-pay | Admitting: Internal Medicine

## 2021-01-06 DIAGNOSIS — Z1231 Encounter for screening mammogram for malignant neoplasm of breast: Secondary | ICD-10-CM

## 2021-01-07 ENCOUNTER — Other Ambulatory Visit: Payer: Self-pay

## 2021-01-07 NOTE — Progress Notes (Signed)
Carelink Summary Report / Loop Recorder 

## 2021-01-13 ENCOUNTER — Other Ambulatory Visit: Payer: Self-pay | Admitting: Internal Medicine

## 2021-01-16 LAB — CUP PACEART REMOTE DEVICE CHECK
Date Time Interrogation Session: 20220818014456
Implantable Pulse Generator Implant Date: 20200619

## 2021-01-20 ENCOUNTER — Ambulatory Visit (INDEPENDENT_AMBULATORY_CARE_PROVIDER_SITE_OTHER): Payer: Medicare Other

## 2021-01-20 ENCOUNTER — Other Ambulatory Visit: Payer: Self-pay

## 2021-01-20 ENCOUNTER — Ambulatory Visit
Admission: RE | Admit: 2021-01-20 | Discharge: 2021-01-20 | Disposition: A | Discharge status: Home / Self Care | Payer: Medicare Other | Source: Ambulatory Visit | Attending: Internal Medicine | Admitting: Internal Medicine

## 2021-01-20 DIAGNOSIS — I639 Cerebral infarction, unspecified: Secondary | ICD-10-CM

## 2021-01-20 DIAGNOSIS — Z1231 Encounter for screening mammogram for malignant neoplasm of breast: Secondary | ICD-10-CM

## 2021-02-06 NOTE — Progress Notes (Signed)
Carelink Summary Report / Loop Recorder 

## 2021-02-11 ENCOUNTER — Other Ambulatory Visit: Payer: Self-pay

## 2021-02-12 ENCOUNTER — Other Ambulatory Visit: Payer: Self-pay

## 2021-02-19 ENCOUNTER — Other Ambulatory Visit: Payer: Self-pay

## 2021-02-19 ENCOUNTER — Ambulatory Visit: Payer: Medicare Other | Admitting: Dermatology

## 2021-02-19 DIAGNOSIS — Z808 Family history of malignant neoplasm of other organs or systems: Secondary | ICD-10-CM

## 2021-02-19 DIAGNOSIS — L738 Other specified follicular disorders: Secondary | ICD-10-CM | POA: Diagnosis not present

## 2021-02-19 DIAGNOSIS — L821 Other seborrheic keratosis: Secondary | ICD-10-CM

## 2021-02-19 DIAGNOSIS — D1800 Hemangioma unspecified site: Secondary | ICD-10-CM

## 2021-02-19 DIAGNOSIS — D2339 Other benign neoplasm of skin of other parts of face: Secondary | ICD-10-CM | POA: Diagnosis not present

## 2021-02-19 DIAGNOSIS — D229 Melanocytic nevi, unspecified: Secondary | ICD-10-CM

## 2021-02-19 DIAGNOSIS — L578 Other skin changes due to chronic exposure to nonionizing radiation: Secondary | ICD-10-CM

## 2021-02-19 LAB — CUP PACEART REMOTE DEVICE CHECK
Date Time Interrogation Session: 20220920014559
Implantable Pulse Generator Implant Date: 20200619

## 2021-02-19 NOTE — Progress Notes (Addendum)
   New Patient Visit   Subjective  Lisa Green is a 70 y.o. female who presents for the following: Follow-up (Patient here today to check 6 spots. She reports 2 spots and bilateral sides of face and 2 spots at chest. Patient states her daughter has been recently diagnosed with melanoma. ).  The following portions of the chart were reviewed this encounter and updated as appropriate:  Tobacco  Allergies  Meds  Problems  Med Hx  Surg Hx  Fam Hx      Review of Systems: No other skin or systemic complaints except as noted in HPI or Assessment and Plan.   Objective  Well appearing patient in no apparent distress; mood and affect are within normal limits.  A focused examination was performed including face, chest, back, bilateral arms, neck , feet,  and top of head. Relevant physical exam findings are noted in the Assessment and Plan. FBSE declined today.  left ala nose Small smooth pink papule without features suspicious for malignancy on dermoscopy  Assessment & Plan  Angiofibroma of skin of nose left ala nose  Benign-appearing.  Observation.  Call clinic for new or changing lesions.   Seborrheic Keratoses - Stuck-on, waxy, tan-brown papules and/or plaques chest, bilateral face, back - Benign-appearing - Discussed benign etiology and prognosis. - Observe - Call for any changes  Sebaceous Hyperplasia - Small yellow papules with a central dell at left cheek  - Benign - Observe  Hemangiomas - Red papules at left side face  - Discussed benign nature - Observe - Call for any changes  Melanocytic Nevi - Tan-brown and/or pink-flesh-colored symmetric macules and papules at back - Benign appearing on exam today - Observation - Call clinic for new or changing moles - Recommend daily use of broad spectrum spf 30+ sunscreen to sun-exposed areas.   Actinic Damage - chronic, secondary to cumulative UV radiation exposure/sun exposure over time - diffuse scaly erythematous  macules with underlying dyspigmentation - Recommend daily broad spectrum sunscreen SPF 30+ to sun-exposed areas, reapply every 2 hours as needed.  - Recommend staying in the shade or wearing long sleeves, sun glasses (UVA+UVB protection) and wide brim hats (4-inch brim around the entire circumference of the hat). - Call for new or changing lesions.  Return if symptoms worsen or fail to improve. I, Ruthell Rummage, CMA, am acting as scribe for Forest Gleason, MD.  Documentation: I have reviewed the above documentation for accuracy and completeness, and I agree with the above.  Forest Gleason, MD

## 2021-02-19 NOTE — Patient Instructions (Addendum)
Recommend taking Heliocare sun protection supplement daily in sunny weather for additional sun protection. For maximum protection on the sunniest days, you can take up to 2 capsules of regular Heliocare OR take 1 capsule of Heliocare Ultra. For prolonged exposure (such as a full day in the sun), you can repeat your dose of the supplement 4 hours after your first dose. Heliocare can be purchased at [facility] or at [email or web address].    Seborrheic Keratosis  What causes seborrheic keratoses? Seborrheic keratoses are harmless, common skin growths that first appear during adult life.  As time goes by, more growths appear.  Some people may develop a large number of them.  Seborrheic keratoses appear on both covered and uncovered body parts.  They are not caused by sunlight.  The tendency to develop seborrheic keratoses can be inherited.  They vary in color from skin-colored to gray, brown, or even black.  They can be either smooth or have a rough, warty surface.   Seborrheic keratoses are superficial and look as if they were stuck on the skin.  Under the microscope this type of keratosis looks like layers upon layers of skin.  That is why at times the top layer may seem to fall off, but the rest of the growth remains and re-grows.    Treatment Seborrheic keratoses do not need to be treated, but can easily be removed in the office.  Seborrheic keratoses often cause symptoms when they rub on clothing or jewelry.  Lesions can be in the way of shaving.  If they become inflamed, they can cause itching, soreness, or burning.  Removal of a seborrheic keratosis can be accomplished by freezing, burning, or surgery. If any spot bleeds, scabs, or grows rapidly, please return to have it checked, as these can be an indication of a skin cancer.   Melanoma ABCDEs  Melanoma is the most dangerous type of skin cancer, and is the leading cause of death from skin disease.  You are more likely to develop melanoma if  you: Have light-colored skin, light-colored eyes, or red or blond hair Spend a lot of time in the sun Tan regularly, either outdoors or in a tanning bed Have had blistering sunburns, especially during childhood Have a close family member who has had a melanoma Have atypical moles or large birthmarks  Early detection of melanoma is key since treatment is typically straightforward and cure rates are extremely high if we catch it early.   The first sign of melanoma is often a change in a mole or a new dark spot.  The ABCDE system is a way of remembering the signs of melanoma.  A for asymmetry:  The two halves do not match. B for border:  The edges of the growth are irregular. C for color:  A mixture of colors are present instead of an even brown color. D for diameter:  Melanomas are usually (but not always) greater than 52mm - the size of a pencil eraser. E for evolution:  The spot keeps changing in size, shape, and color.  Please check your skin once per month between visits. You can use a small mirror in front and a large mirror behind you to keep an eye on the back side or your body.   If you see any new or changing lesions before your next follow-up, please call to schedule a visit.  Please continue daily skin protection including broad spectrum sunscreen SPF 30+ to sun-exposed areas, reapplying every 2 hours as  needed when you're outdoors.   Staying in the shade or wearing long sleeves, sun glasses (UVA+UVB protection) and wide brim hats (4-inch brim around the entire circumference of the hat) are also recommended for sun protection.    If you have any questions or concerns for your doctor, please call our main line at [phone] and press option 4 to reach your doctor's medical assistant. If no one answers, please leave a voicemail as directed and we will return your call as soon as possible. Messages left after 4 pm will be answered the following business day.   You may also send [place]  a message via [place]. We typically respond to MyChart messages within 1-2 business days.  For prescription refills, please ask your pharmacy to contact our office. Our fax number is [phone].  If you have an urgent issue when the clinic is closed that cannot wait until the next business day, you can page your doctor at the number below.    Please note that while we do our best to be available for urgent issues outside of office hours, we are not available 24/7.   If you have an urgent issue and are unable to reach [place], you may choose to seek medical care at your doctor's office, retail clinic, urgent care center, or emergency room.  If you have a medical emergency, please immediately call 911 or go to the emergency department.  Pager Numbers  - Dr. Nehemiah Massed: [phone]  - Dr. Laurence Ferrari: [phone]  - Dr. Nicole Kindred: [phone]  In the event of inclement weather, please call our main line at [phone] for an update on the status of any delays or closures.  Dermatology Medication Tips: Please keep the boxes that topical medications come in in order to help keep track of the instructions about where and how to use these. Pharmacies typically print the medication instructions only on the boxes and not directly on the medication tubes.   If your medication is too expensive, please contact our office at [phone] option 4 or send [place] a message through [place].   We are unable to tell what your co-pay for medications will be in advance as this is different depending on your insurance coverage. However, we may be able to find a substitute medication at lower cost or fill out paperwork to get insurance to cover a needed medication.   If a prior authorization is required to get your medication covered by your insurance company, please allow [place] 1-2 business days to complete this process.  Drug prices often vary depending on where the prescription is filled and some pharmacies may offer  cheaper prices.  The website www.goodrx.com contains coupons for medications through different pharmacies. The prices here do not account for what the cost may be with help from insurance (it may be cheaper with your insurance), but the website can give you the price if you did not use any insurance.  - You can print the associated coupon and take it with your prescription to the pharmacy.  - You may also stop by our office during regular business hours and pick up a GoodRx coupon card.  - If you need your prescription sent electronically to a different pharmacy, notify our office through [facility] or by phone at [phone] option 4.

## 2021-02-24 ENCOUNTER — Ambulatory Visit (INDEPENDENT_AMBULATORY_CARE_PROVIDER_SITE_OTHER): Payer: Medicare Other

## 2021-02-24 DIAGNOSIS — I639 Cerebral infarction, unspecified: Secondary | ICD-10-CM | POA: Diagnosis not present

## 2021-02-25 ENCOUNTER — Encounter: Payer: Self-pay | Admitting: Dermatology

## 2021-02-26 NOTE — Addendum Note (Signed)
Addended by: Alfonso Patten on: 02/26/2021 12:07 PM   Modules accepted: Level of Service

## 2021-03-03 NOTE — Progress Notes (Signed)
Carelink Summary Report / Loop Recorder 

## 2021-03-10 ENCOUNTER — Other Ambulatory Visit: Payer: Self-pay

## 2021-03-25 LAB — CUP PACEART REMOTE DEVICE CHECK
Date Time Interrogation Session: 20221023014839
Implantable Pulse Generator Implant Date: 20200619

## 2021-03-31 ENCOUNTER — Ambulatory Visit (INDEPENDENT_AMBULATORY_CARE_PROVIDER_SITE_OTHER): Payer: Medicare Other

## 2021-03-31 DIAGNOSIS — I639 Cerebral infarction, unspecified: Secondary | ICD-10-CM | POA: Diagnosis not present

## 2021-04-07 NOTE — Progress Notes (Signed)
Carelink Summary Report / Loop Recorder 

## 2021-04-25 ENCOUNTER — Ambulatory Visit (INDEPENDENT_AMBULATORY_CARE_PROVIDER_SITE_OTHER): Payer: Medicare Other

## 2021-04-25 DIAGNOSIS — I639 Cerebral infarction, unspecified: Secondary | ICD-10-CM | POA: Diagnosis not present

## 2021-04-27 LAB — CUP PACEART REMOTE DEVICE CHECK
Date Time Interrogation Session: 20221125005228
Implantable Pulse Generator Implant Date: 20200619

## 2021-04-29 NOTE — Progress Notes (Signed)
Carelink Summary Report / Loop Recorder 

## 2021-05-28 ENCOUNTER — Ambulatory Visit (INDEPENDENT_AMBULATORY_CARE_PROVIDER_SITE_OTHER): Payer: Medicare Other

## 2021-05-28 DIAGNOSIS — I639 Cerebral infarction, unspecified: Secondary | ICD-10-CM | POA: Diagnosis not present

## 2021-05-28 LAB — CUP PACEART REMOTE DEVICE CHECK
Date Time Interrogation Session: 20221228005412
Implantable Pulse Generator Implant Date: 20200619

## 2021-06-03 ENCOUNTER — Other Ambulatory Visit: Payer: Self-pay

## 2021-06-10 NOTE — Progress Notes (Signed)
Carelink Summary Report / Loop Recorder 

## 2021-06-30 ENCOUNTER — Ambulatory Visit (INDEPENDENT_AMBULATORY_CARE_PROVIDER_SITE_OTHER): Payer: Medicare Other

## 2021-06-30 DIAGNOSIS — I639 Cerebral infarction, unspecified: Secondary | ICD-10-CM | POA: Diagnosis not present

## 2021-06-30 LAB — CUP PACEART REMOTE DEVICE CHECK
Date Time Interrogation Session: 20230130005838
Implantable Pulse Generator Implant Date: 20200619

## 2021-07-07 ENCOUNTER — Other Ambulatory Visit: Payer: Self-pay

## 2021-07-08 NOTE — Progress Notes (Signed)
Carelink Summary Report / Loop Recorder 

## 2021-07-12 ENCOUNTER — Other Ambulatory Visit: Payer: Self-pay | Admitting: Internal Medicine

## 2021-08-04 ENCOUNTER — Other Ambulatory Visit: Payer: Self-pay

## 2021-08-04 ENCOUNTER — Ambulatory Visit (INDEPENDENT_AMBULATORY_CARE_PROVIDER_SITE_OTHER): Payer: Medicare Other

## 2021-08-04 DIAGNOSIS — I639 Cerebral infarction, unspecified: Secondary | ICD-10-CM | POA: Diagnosis not present

## 2021-08-05 LAB — CUP PACEART REMOTE DEVICE CHECK
Date Time Interrogation Session: 20230304005859
Implantable Pulse Generator Implant Date: 20200619

## 2021-08-15 NOTE — Progress Notes (Signed)
Carelink Summary Report / Loop Recorder 

## 2021-09-08 ENCOUNTER — Ambulatory Visit (INDEPENDENT_AMBULATORY_CARE_PROVIDER_SITE_OTHER): Payer: Medicare Other

## 2021-09-08 ENCOUNTER — Other Ambulatory Visit: Payer: Self-pay | Admitting: Internal Medicine

## 2021-09-08 ENCOUNTER — Other Ambulatory Visit: Payer: Self-pay

## 2021-09-08 DIAGNOSIS — I639 Cerebral infarction, unspecified: Secondary | ICD-10-CM | POA: Diagnosis not present

## 2021-09-08 DIAGNOSIS — E785 Hyperlipidemia, unspecified: Secondary | ICD-10-CM

## 2021-09-09 ENCOUNTER — Other Ambulatory Visit: Payer: Self-pay

## 2021-09-09 ENCOUNTER — Other Ambulatory Visit: Payer: Self-pay | Admitting: Internal Medicine

## 2021-09-09 DIAGNOSIS — E785 Hyperlipidemia, unspecified: Secondary | ICD-10-CM

## 2021-09-09 MED FILL — Apixaban Tab 5 MG: ORAL | 90 days supply | Qty: 180 | Fill #0 | Status: AC

## 2021-09-10 LAB — CUP PACEART REMOTE DEVICE CHECK
Date Time Interrogation Session: 20230406020124
Implantable Pulse Generator Implant Date: 20200619

## 2021-09-25 NOTE — Progress Notes (Signed)
Carelink Summary Report / Loop Recorder 

## 2021-10-13 ENCOUNTER — Ambulatory Visit (INDEPENDENT_AMBULATORY_CARE_PROVIDER_SITE_OTHER): Payer: Medicare Other

## 2021-10-13 DIAGNOSIS — I639 Cerebral infarction, unspecified: Secondary | ICD-10-CM

## 2021-10-15 LAB — CUP PACEART REMOTE DEVICE CHECK
Date Time Interrogation Session: 20230510231551
Implantable Pulse Generator Implant Date: 20200619

## 2021-11-03 NOTE — Progress Notes (Signed)
Carelink Summary Report / Loop Recorder 

## 2021-11-04 ENCOUNTER — Ambulatory Visit (INDEPENDENT_AMBULATORY_CARE_PROVIDER_SITE_OTHER): Payer: Medicare Other

## 2021-11-04 VITALS — Ht 65.0 in | Wt 188.0 lb

## 2021-11-04 DIAGNOSIS — Z0000 Encounter for general adult medical examination without abnormal findings: Secondary | ICD-10-CM

## 2021-11-04 NOTE — Patient Instructions (Addendum)
  Ms. Kissick , Thank you for taking time to come for your Medicare Wellness Visit. I appreciate your ongoing commitment to your health goals. Please review the following plan we discussed and let me know if I can assist you in the future.   These are the goals we discussed:  Goals      Weight (lb) < 188 lb (85.3 kg)     Weight loss goal 170lb        This is a list of the screening recommended for you and due dates:  Health Maintenance  Topic Date Due   COVID-19 Vaccine (4 - Booster for Pfizer series) 11/20/2021*   Colon Cancer Screening  01/18/2022*   Zoster (Shingles) Vaccine (1 of 2) 02/04/2022*   Flu Shot  12/30/2021   Mammogram  01/20/2022   Tetanus Vaccine  12/14/2030   Pneumonia Vaccine  Completed   DEXA scan (bone density measurement)  Completed   HPV Vaccine  Aged Out  *Topic was postponed. The date shown is not the original due date.

## 2021-11-04 NOTE — Progress Notes (Signed)
Subjective:   Lisa Green is a 71 y.o. female who presents for an Initial Medicare Annual Wellness Visit.  Review of Systems    No ROS.  Medicare Wellness Virtual Visit.  Visual/audio telehealth visit, UTA vital signs.   See social history for additional risk factors.   Cardiac Risk Factors include: advanced age (>63mn, >>89women)     Objective:    Today's Vitals   11/04/21 1056  Weight: 188 lb (85.3 kg)  Height: '5\' 5"'$  (1.651 m)   Body mass index is 31.28 kg/m.     11/04/2021   10:54 AM 11/16/2018   10:00 PM  Advanced Directives  Does Patient Have a Medical Advance Directive? Yes Yes  Type of AParamedicof AParklineLiving will Living will;Healthcare Power of Attorney  Does patient want to make changes to medical advance directive? No - Patient declined No - Patient declined  Copy of [place] Chart? No - copy requested     Current Medications (verified) Outpatient Encounter Medications as of 11/04/2021  Medication Sig   apixaban (ELIQUIS) 5 MG TABS tablet TAKE 1 TABLET BY MOUTH 2 TIMES DAILY.   hydrochlorothiazide (HYDRODIURIL) 25 MG tablet Take 1 tablet (25 mg total) by mouth daily.   rosuvastatin (CRESTOR) 20 MG tablet TAKE 1 TABLET(20 MG) BY MOUTH DAILY   telmisartan (MICARDIS) 80 MG tablet TAKE 1 TABLET(80 MG) BY MOUTH AT BEDTIME   No facility-administered encounter medications on file as of 11/04/2021.    Allergies (verified) Wasp venom protein and Insect extract allergy skin test   History: Past Medical History:  Diagnosis Date   Diverticulosis 2013   colonoscopy, [place]   Hyperlipidemia    Obesity (BMI 30.0-34.9)    Stroke ([place] 11/17/2018   R MCA cortical, cryptogenic   Past Surgical History:  Procedure Laterality Date   ABDOMINAL HYSTERECTOMY     with wedge resection of right ovary , normal path   BUBBLE STUDY  11/18/2018   Procedure: BUBBLE STUDY;  Surgeon: HPixie Casino MD;  [facility];  Service: Cardiovascular;;   [place]    ENDOVENOUS ABLATION SAPHENOUS VEIN W/ LASER  2008   for varicose veins,  [place], [place]   LOOP RECORDER INSERTION N/A 11/18/2018   Procedure: LOOP RECORDER INSERTION;  Surgeon: AThompson Grayer MD;  [facility];  Service: Cardiovascular;  Laterality: N/A;   TEE WITHOUT CARDIOVERSION N/A 11/18/2018   Procedure: TRANSESOPHAGEAL ECHOCARDIOGRAM (TEE);  Surgeon: HPixie Casino MD;  [facility];  Service: Cardiovascular;  Laterality: N/A;   Family History  Problem Relation Age of Onset   Coronary artery disease Father 565  Stroke Father    Coronary artery disease Paternal Uncle    Heart disease Paternal Uncle    Coronary artery disease Paternal Grandmother    Heart disease Paternal Grandmother    Coronary artery disease Paternal Grandfather    Heart disease Paternal Grandfather    Heart disease Mother    Cancer Sister 458      ovarian ca   Heart disease Brother    Breast cancer Neg Hx    Social History   Socioeconomic History   Marital status: Married    Spouse name: Not on file   Number of children: Not on file   Years of education: Not on file   Highest education  level: Not on file  Occupational History   Occupation: Production assistant, radio    Comment: OT at [employer] Use   Smoking status: Never   Smokeless tobacco: Never  Substance and Sexual Activity   Alcohol use: Yes    Comment: 2 oz red wine daily   Drug use: No   Sexual activity: Not on file  Other Topics Concern   Not on file  Social History Narrative   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   Social Determinants of Health   Financial Resource Strain: Low Risk    Difficulty of Paying Living Expenses: Not hard at all  Food Insecurity: No Food Insecurity   Worried About Charity fundraiser in the Last Year: Never true   [place] in the Last Year:  Never true  Transportation Needs: No Transportation Needs   Lack of Transportation (Medical): No   Lack of Transportation (Non-Medical): No  Physical Activity: Insufficiently Active   Days of Exercise per Week: 3 days   Minutes of Exercise per Session: 40 min  Stress: No Stress Concern Present   Feeling of Stress : Not at all  Social Connections: Unknown   Frequency of Communication with Friends and Family: Not on file   Frequency of Social Gatherings with Friends and Family: Not on file   Attends Religious Services: Not on Electrical engineer or Organizations: Not on file   Attends Archivist Meetings: Not on file   Marital Status: Married   Tobacco Counseling Counseling given: Not Answered  Clinical Intake: Pre-visit preparation completed: Yes       Diabetes: No  How often do you need to have someone help you when you read instructions, pamphlets, or other written materials from your doctor or pharmacy?: 1 - Never    Interpreter Needed?: No    Activities of Daily Living    11/04/2021   10:51 AM  In your present state of health, do you have any difficulty performing the following activities:  Hearing? 0  Vision? 0  Difficulty concentrating or making decisions? 0  Walking or climbing stairs? 0  Dressing or bathing? 0  Doing errands, shopping? 0  Preparing Food and eating ? N  Using the Toilet? N  In the past six months, have you accidently leaked urine? N  Do you have problems with loss of bowel control? N  Managing your Medications? N  Managing your Finances? N  Housekeeping or managing your Housekeeping? N   Patient Care Team: Crecencio Mc, MD as PCP - General (Internal Medicine) Crecencio Mc, MD (Internal Medicine)  Indicate any recent Medical Services you may have received from other than [facility] providers in the past year (date may be approximate).     Assessment:   This is a routine wellness examination for Lisa Green.  Virtual  Visit via Telephone Note  I connected with  Lisa Green on 11/04/21 at 10:45 AM EDT by telephone and verified that I am speaking with the correct person using two identifiers.  Persons participating in the virtual visit: patient/Nurse Health Advisor   I discussed the limitations of performing an evaluation and management service by telehealth. We continued and completed visit with audio only. Some vital signs may be absent or patient reported.   Hearing/Vision screen Hearing Screening - Comments:: Patient is able to hear conversational tones without difficulty.  No issues reported.  Vision Screening - Comments:: Followed by [facility] Wears corrective lenses They have seen their ophthalmologist in the last 12 months.   Dietary issues and exercise activities discussed: Current Exercise Habits: Home exercise routine, Type of exercise: walking, Time (Minutes): 35, Frequency (Times/Week): 3, Weekly Exercise (Minutes/Week): 105, Intensity: Mild    Goals Addressed             This Visit's Progress    Weight (lb) < 188 lb (85.3 kg)   188 lb (85.3 kg)    Weight loss goal 170lb       Depression Screen    11/04/2021   10:52 AM 12/31/2020    3:28 PM 05/20/2020    8:12 AM 12/22/2018   10:10 AM 05/30/2018   11:16 AM 04/07/2017    9:36 AM  PHQ 2/9 Scores  PHQ - 2 Score 0 0 0 0 2 0  PHQ- 9 Score     4 1    Fall Risk    11/04/2021   10:52 AM 12/31/2020    3:28 PM 05/20/2020    8:12 AM 11/17/2019    8:49 AM 05/30/2018   11:17 AM  Fall Risk   Falls in the past year? 0 0 0 0 0  Number falls in past yr: 0  0    Injury with Fall?   0    Follow up Falls evaluation completed Falls evaluation completed Falls evaluation completed Falls evaluation completed    [place]: Home free of loose throw rugs in walkways, pet beds, electrical cords, etc? Yes  Adequate lighting in your home to reduce risk of falls? Yes   ASSISTIVE DEVICES UTILIZED TO  PREVENT FALLS: Life alert? No  Use of a cane, walker or w/c? No   TIMED UP AND GO: Was the test performed? No .   Cognitive Function:  Patient is alert and oriented x3.       Immunizations Immunization History  Administered Date(s) Administered   Influenza Split 02/23/2013, 02/21/2015   Influenza-Unspecified 03/01/2014, 02/15/2017, 02/21/2018, 02/27/2019, 04/01/2020   PFIZER(Purple Top)SARS-COV-2 Vaccination 06/23/2019, 07/21/2019, 03/22/2020   Pneumococcal Conjugate-13 04/07/2017   Pneumococcal Polysaccharide-23 05/30/2018   Tdap 06/19/2009, 12/13/2020   Zoster, Live 10/30/2012   Shingrix Completed?: No.    Education has been provided regarding the importance of this vaccine. Patient has been advised to call insurance company to determine out of pocket expense if they have not yet received this vaccine. Advised may also receive vaccine at local pharmacy or Health Dept. Verbalized acceptance and understanding.  Screening Tests Health Maintenance  Topic Date Due   COVID-19 Vaccine (4 - Booster for Pfizer series) 11/20/2021 (Originally 05/17/2020)   COLONOSCOPY (Pts 45-76yr Insurance coverage will need to be confirmed)  01/18/2022 (Originally 06/16/2021)   Zoster Vaccines- Shingrix (1 of 2) 02/04/2022 (Originally 04/02/1970)   INFLUENZA VACCINE  12/30/2021   MAMMOGRAM  01/20/2022   TETANUS/TDAP  12/14/2030   Pneumonia Vaccine 70 Years old  Completed   DEXA SCAN  Completed   HPV VACCINES  Aged Out   Health Maintenance There are no preventive care reminders to display for this patient.  Colonoscopy- deferred for further discussion with PCP per patient preference.   Lung Cancer Screening: (Low Dose CT Chest recommended if Age 70-80years, 30 pack-year currently smoking OR have quit w/in 15years.) does not qualify.   Vision Screening: Recommended annual ophthalmology exams for early detection of glaucoma and other disorders of the eye.  Dental Screening: Recommended annual  dental exams for proper  oral hygiene  Community Resource Referral / Chronic Care Management: CRR required this visit?  No   CCM required this visit?  No      Plan:     I have personally reviewed and noted the following in the patient's chart:   Medical and social history Use of alcohol, tobacco or illicit drugs  Current medications and supplements including opioid prescriptions. Patient is not currently taking opioid prescriptions. Functional ability and status Nutritional status Physical activity Advanced directives List of other physicians Hospitalizations, surgeries, and ER visits in previous 12 months Vitals Screenings to include cognitive, depression, and falls Referrals and appointments  In addition, I have reviewed and discussed with patient certain preventive protocols, quality metrics, and best practice recommendations. A written personalized care plan for preventive services as well as general preventive health recommendations were provided to patient.     Varney Biles, LPN   1/0/3159

## 2021-11-05 ENCOUNTER — Other Ambulatory Visit: Payer: Self-pay | Admitting: Internal Medicine

## 2021-11-17 ENCOUNTER — Ambulatory Visit (INDEPENDENT_AMBULATORY_CARE_PROVIDER_SITE_OTHER): Payer: Medicare Other

## 2021-11-17 DIAGNOSIS — I639 Cerebral infarction, unspecified: Secondary | ICD-10-CM

## 2021-11-19 LAB — CUP PACEART REMOTE DEVICE CHECK
Date Time Interrogation Session: 20230612231920
Implantable Pulse Generator Implant Date: 20200619

## 2021-12-09 NOTE — Progress Notes (Signed)
Carelink Summary Report / Loop Recorder 

## 2021-12-22 ENCOUNTER — Ambulatory Visit (INDEPENDENT_AMBULATORY_CARE_PROVIDER_SITE_OTHER): Payer: Medicare Other

## 2021-12-22 DIAGNOSIS — I639 Cerebral infarction, unspecified: Secondary | ICD-10-CM

## 2021-12-23 LAB — CUP PACEART REMOTE DEVICE CHECK
Date Time Interrogation Session: 20230715232214
Implantable Pulse Generator Implant Date: 20200619

## 2022-01-19 ENCOUNTER — Ambulatory Visit (INDEPENDENT_AMBULATORY_CARE_PROVIDER_SITE_OTHER): Payer: Medicare Other | Admitting: Internal Medicine

## 2022-01-19 ENCOUNTER — Encounter: Payer: Self-pay | Admitting: Internal Medicine

## 2022-01-19 VITALS — BP 110/72 | HR 60 | Temp 98.2°F | Ht 65.0 in | Wt 184.0 lb

## 2022-01-19 DIAGNOSIS — Z0000 Encounter for general adult medical examination without abnormal findings: Secondary | ICD-10-CM

## 2022-01-19 DIAGNOSIS — Z1231 Encounter for screening mammogram for malignant neoplasm of breast: Secondary | ICD-10-CM | POA: Diagnosis not present

## 2022-01-19 DIAGNOSIS — I6930 Unspecified sequelae of cerebral infarction: Secondary | ICD-10-CM | POA: Diagnosis not present

## 2022-01-19 DIAGNOSIS — E785 Hyperlipidemia, unspecified: Secondary | ICD-10-CM

## 2022-01-19 DIAGNOSIS — I480 Paroxysmal atrial fibrillation: Secondary | ICD-10-CM

## 2022-01-19 DIAGNOSIS — R5383 Other fatigue: Secondary | ICD-10-CM | POA: Diagnosis not present

## 2022-01-19 DIAGNOSIS — R7303 Prediabetes: Secondary | ICD-10-CM

## 2022-01-19 DIAGNOSIS — D6869 Other thrombophilia: Secondary | ICD-10-CM

## 2022-01-19 DIAGNOSIS — I10 Essential (primary) hypertension: Secondary | ICD-10-CM

## 2022-01-19 DIAGNOSIS — Z1211 Encounter for screening for malignant neoplasm of colon: Secondary | ICD-10-CM

## 2022-01-19 DIAGNOSIS — K760 Fatty (change of) liver, not elsewhere classified: Secondary | ICD-10-CM

## 2022-01-19 LAB — CBC WITH DIFFERENTIAL/PLATELET
Basophils Absolute: 0 10*3/uL (ref 0.0–0.1)
Basophils Relative: 0.3 % (ref 0.0–3.0)
Eosinophils Absolute: 0 10*3/uL (ref 0.0–0.7)
Eosinophils Relative: 1 % (ref 0.0–5.0)
HCT: 39.2 % (ref 36.0–46.0)
Hemoglobin: 13.6 g/dL (ref 12.0–15.0)
Lymphocytes Relative: 37.4 % (ref 12.0–46.0)
Lymphs Abs: 1.5 10*3/uL (ref 0.7–4.0)
MCHC: 34.6 g/dL (ref 30.0–36.0)
MCV: 94.2 fl (ref 78.0–100.0)
Monocytes Absolute: 0.3 10*3/uL (ref 0.1–1.0)
Monocytes Relative: 6.7 % (ref 3.0–12.0)
Neutro Abs: 2.2 10*3/uL (ref 1.4–7.7)
Neutrophils Relative %: 54.6 % (ref 43.0–77.0)
Platelets: 235 10*3/uL (ref 150.0–400.0)
RBC: 4.16 Mil/uL (ref 3.87–5.11)
RDW: 12.8 % (ref 11.5–15.5)
WBC: 4 10*3/uL (ref 4.0–10.5)

## 2022-01-19 LAB — TSH: TSH: 1.28 u[IU]/mL (ref 0.35–5.50)

## 2022-01-19 LAB — COMPREHENSIVE METABOLIC PANEL
ALT: 28 U/L (ref 0–35)
AST: 22 U/L (ref 0–37)
Albumin: 4.7 g/dL (ref 3.5–5.2)
Alkaline Phosphatase: 73 U/L (ref 39–117)
BUN: 14 mg/dL (ref 6–23)
CO2: 31 mEq/L (ref 19–32)
Calcium: 10.4 mg/dL (ref 8.4–10.5)
Chloride: 99 mEq/L (ref 96–112)
Creatinine, Ser: 0.68 mg/dL (ref 0.40–1.20)
GFR: 88.62 mL/min (ref >60.00)
Glucose, Bld: 95 mg/dL (ref 70–99)
Potassium: 3.8 mEq/L (ref 3.5–5.1)
Sodium: 139 mEq/L (ref 135–145)
Total Bilirubin: 0.7 mg/dL (ref 0.2–1.2)
Total Protein: 6.8 g/dL (ref 6.0–8.3)

## 2022-01-19 LAB — LIPID PANEL
Cholesterol: 164 mg/dL (ref 0–200)
HDL: 53 mg/dL (ref >39.00)
LDL Cholesterol: 92 mg/dL (ref 0–99)
NonHDL: 110.75
Total CHOL/HDL Ratio: 3
Triglycerides: 96 mg/dL (ref 0.0–149.0)
VLDL: 19.2 mg/dL (ref 0.0–40.0)

## 2022-01-19 LAB — MICROALBUMIN / CREATININE URINE RATIO
Creatinine,U: 58.9 mg/dL
Microalb Creat Ratio: 1.2 mg/g (ref 0.0–30.0)
Microalb, Ur: <0.7 mg/dL (ref 0.0–1.9)

## 2022-01-19 LAB — HEMOGLOBIN A1C: Hgb A1c MFr Bld: 5.8 % (ref 4.6–6.5)

## 2022-01-19 LAB — LDL CHOLESTEROL, DIRECT: Direct LDL: 92 mg/dL

## 2022-01-19 NOTE — Assessment & Plan Note (Signed)
Numbness of left hand without pain or loss of function.  Can feel heat and cold

## 2022-01-19 NOTE — Assessment & Plan Note (Signed)
a1c hadrisen to 6.0  She has deferred use of r Ozempic  For management of obesity and fatty liver.  Continue statin as well.  Lab Results  Component Value Date   HGBA1C 6.0 12/31/2020

## 2022-01-19 NOTE — Assessment & Plan Note (Signed)
Complicated by hypertension,  Fatty liver and prediabetes.  I have addressed  BMI and recommended a low glycemic index diet utilizing smaller more frequent meals to increase metabolism.  I have also recommended that patient start exercising with a goal of 30 minutes of aerobic exercise a minimum of 5 days per week.   Lab Results  Component Value Date   HGBA1C 6.0 12/31/2020

## 2022-01-19 NOTE — Assessment & Plan Note (Signed)
Secondary to obesity and lifestyle with prediabetes also noted.   lfts have normalized.  She is taking a statin.   HepA/B vaccination again recommended .  Will recommend trial of metformin vs ozempic   For  A1c  > 6.0  Lab Results  Component Value Date   ALT 32 12/31/2020   AST 23 12/31/2020   ALKPHOS 63 12/31/2020   BILITOT 0.5 12/31/2020

## 2022-01-19 NOTE — Assessment & Plan Note (Addendum)
She is tolerating  Eliquis for secondary prevention of  embolic stroke risk  due to paroxysmal atrial fibrillation detected on implantable loop recorder following the event .  However, she  has had no episodes of AF in over 2 years and would like to stop the medication and start a daily ASA . Marland Kitchen Patient has no signs of bleeding; will discuss with cardiology

## 2022-01-19 NOTE — Assessment & Plan Note (Signed)
Managed with crestor 20 mg daily

## 2022-01-19 NOTE — Patient Instructions (Signed)
I will let you know what the cardiologist recommend about stopping Eliquis ASAP   Your annual mammogram has  been ordered.  Please call [place] to call to make your appointments  .  The phone number for [place] is  [phone]

## 2022-01-19 NOTE — Progress Notes (Signed)
Patient ID: Lisa Green, female    DOB: 27-May-1951  Age: 71 y.o. MRN: [id number]  The patient is here for annual preventive examination and management of other chronic and acute problems.   The risk factors are reflected in the social history.  The roster of all physicians providing medical care to patient - is listed in the Snapshot section of the chart.  Activities of daily living:  The patient is 100% independent in all ADLs: dressing, toileting, feeding as well as independent mobility  Home safety : The patient has smoke detectors in the home. They wear seatbelts.  There are no firearms at home. There is no violence in the home.   There is no risks for hepatitis, STDs or HIV. There is no   history of blood transfusion. They have no travel history to infectious disease endemic areas of the world.  The patient has seen their dentist in the last six month. They have seen their eye doctor in the last year. They admit to slight hearing difficulty with regard to whispered voices and some television programs.  They have deferred audiologic testing in the last year.  They do not  have excessive sun exposure. Discussed the need for sun protection: hats, long sleeves and use of sunscreen if there is significant sun exposure.   Diet: the importance of a healthy diet is discussed. They do have a healthy diet.  The benefits of regular aerobic exercise were discussed. She walks 4 times per week ,  20 minutes.   Depression screen: there are no signs or vegative symptoms of depression- irritability, change in appetite, anhedonia, sadness/tearfullness.  Cognitive assessment: the patient manages all their financial and personal affairs and is actively engaged. They could relate day,date,year and events; recalled 2/3 objects at 3 minutes; performed clock-face test normally.  The following portions of the patient's history were reviewed and updated as appropriate: allergies, current medications, past family  history, past medical history,  past surgical history, past social history  and problem list.  Visual acuity was not assessed per patient preference since she has regular follow up with her ophthalmologist. Hearing and body mass index were assessed and reviewed.   During the course of the visit the patient was educated and counseled about appropriate screening and preventive services including : fall prevention , diabetes screening, nutrition counseling, colorectal cancer screening, and recommended immunizations.    CC: The primary encounter diagnosis was Screening for colon cancer. Diagnoses of Primary hypertension, Hyperlipidemia LDL goal <70, Prediabetes, Other fatigue, Encounter for screening mammogram for malignant neoplasm of breast, Acquired thrombophilia ([place]), Sequelae of cerebral infarction, Hepatic steatosis, Encounter for preventive health examination, Morbid obesity ([place]), and PAF (paroxysmal atrial fibrillation) ([place]) were also pertinent to this visit.  1) H/o embolic CVA involving the right MCA , occurred 3 years ago in a June 2020 during a period of increased work stress. No embolic source found. CVA Occurred after taking a shower. Implantable loop recorder planted  had 2 documented episodes of AF lasting 7-8 hours each , occurring during sleep)  but has not had any episodes in 2 years Has not seen a  cardiologist since discharge from hospitalization. She has monthly reports form loop recorder (see chart)  . She is requesting to to stop Eliquis and start aspirin.  She was not taking aspirin at the time of the CVA  .  She has never had a SLEEP STUDY.  Husband states that she does not snore and does not have  apneic events (husband has OSA and wears CPAP)    History Lisa Green has a past medical history of Diverticulosis (2013), Hyperlipidemia, Obesity (BMI 30.0-34.9), and Stroke ([place]) (11/17/2018).   She has a past surgical history that includes Cesarean section (1981); Cesarean section  (1983); Abdominal hysterectomy; Cosmetic surgery; Endovenous ablation saphenous vein w/ laser (2008); LOOP RECORDER INSERTION (N/A, 11/18/2018); TEE without cardioversion (N/A, 11/18/2018); and Bubble study (11/18/2018).   Her family history includes Cancer (age of onset: 66) in her sister; Coronary artery disease in her paternal grandfather, paternal grandmother, and paternal uncle; Coronary artery disease (age of onset: 49) in her father; Heart disease in her brother, mother, paternal grandfather, paternal grandmother, and paternal uncle; Stroke in her father.She reports that she has never smoked. She has never used smokeless tobacco. She reports current alcohol use. She reports that she does not use drugs.  Outpatient Medications Prior to Visit  Medication Sig Dispense Refill   apixaban (ELIQUIS) 5 MG TABS tablet TAKE 1 TABLET BY MOUTH 2 TIMES DAILY. 180 tablet 1   hydrochlorothiazide (HYDRODIURIL) 25 MG tablet TAKE 1 TABLET(25 MG) BY MOUTH DAILY 90 tablet 3   rosuvastatin (CRESTOR) 20 MG tablet TAKE 1 TABLET(20 MG) BY MOUTH DAILY 90 tablet 1   telmisartan (MICARDIS) 80 MG tablet TAKE 1 TABLET(80 MG) BY MOUTH AT BEDTIME 90 tablet 1   No facility-administered medications prior to visit.    Review of Systems  Patient denies headache, fevers, malaise, unintentional weight loss, skin rash, eye pain, sinus congestion and sinus pain, sore throat, dysphagia,  hemoptysis , cough, dyspnea, wheezing, chest pain, palpitations, orthopnea, edema, abdominal pain, nausea, melena, diarrhea, constipation, flank pain, dysuria, hematuria, urinary  Frequency, nocturia, numbness, tingling, seizures,  Focal weakness, Loss of consciousness,  Tremor, insomnia, depression, anxiety, and suicidal ideation.     Objective:  BP 110/72 (BP Location: Left Arm, Patient Position: Sitting, Cuff Size: Large)   Pulse 60   Temp 98.2 F (36.8 C) (Oral)   Ht 5' 5"  (1.651 m)   Wt 184 lb (83.5 kg)   SpO2 99%   BMI 30.62 kg/m    Physical Exam  General appearance: alert, cooperative and appears stated age Head: Normocephalic, without obvious abnormality, atraumatic Eyes: conjunctivae/corneas clear. PERRL, EOM's intact. Fundi benign. Ears: normal TM's and external ear canals both ears Nose: Nares normal. Septum midline. Mucosa normal. No drainage or sinus tenderness. Throat: lips, mucosa, and tongue normal; teeth and gums normal Neck: no adenopathy, no carotid bruit, no JVD, supple, symmetrical, trachea midline and thyroid not enlarged, symmetric, no tenderness/mass/nodules Lungs: clear to auscultation bilaterally Breasts: normal appearance, no masses or tenderness Heart: regular rate and rhythm, S1, S2 normal, no murmur, click, rub or gallop Abdomen: soft, non-tender; bowel sounds normal; no masses,  no organomegaly Extremities: extremities normal, atraumatic, no cyanosis or edema Pulses: 2+ and symmetric Skin: Skin color, texture, turgor normal. No rashes or lesions Neurologic: Alert and oriented X 3, normal strength and tone. Normal symmetric reflexes. Normal coordination and gait.     Assessment & Plan:   Problem List Items Addressed This Visit     Acquired thrombophilia ([place])    She is tolerating  Eliquis for secondary prevention of  embolic stroke risk  due to paroxysmal atrial fibrillation detected on implantable loop recorder following the event .  However, she  has had no episodes of AF in over 2 years and would like to stop the medication and start a daily ASA . Marland Kitchen Patient has no signs  of bleeding; will discuss with cardiology      Encounter for preventive health examination    age appropriate education and counseling updated, referrals for preventative services and immunizations addressed, dietary and smoking counseling addressed, most recent labs reviewed.  I have personally reviewed and have noted:   1) the patient's medical and social history 2) The pt's use of alcohol, tobacco, and illicit  drugs 3) The patient's current medications and supplements 4) Functional ability including ADL's, fall risk, home safety risk, hearing and visual impairment 5) Diet and physical activities 6) Evidence for depression or mood disorder 7) The patient's height, weight, and BMI have been recorded in the chart    I have made referrals, and provided counseling and education based on review of the above      Hepatic steatosis    Secondary to obesity and lifestyle with prediabetes also noted.   lfts have normalized.  She is taking a statin.   HepA/B vaccination again recommended .  Will recommend trial of metformin vs ozempic   For  A1c  > 6.0  Lab Results  Component Value Date   ALT 32 12/31/2020   AST 23 12/31/2020   ALKPHOS 63 12/31/2020   BILITOT 0.5 12/31/2020         Hyperlipidemia LDL goal <70    Managed with crestor 20 mg daily      Relevant Orders   Lipid Profile (Completed)   Direct LDL (Completed)   Hypertension    Well controlled on current regimen of telmisartan hctz . Renal function stable, no changes today.      Relevant Orders   Comp Met (CMET) (Completed)   Urine Microalbumin w/creat. ratio (Completed)   Morbid obesity (HCC)    Complicated by hypertension,  Fatty liver and prediabetes.  I have addressed  BMI and recommended a low glycemic index diet utilizing smaller more frequent meals to increase metabolism.  I have also recommended that patient start exercising with a goal of 30 minutes of aerobic exercise a minimum of 5 days per week.   Lab Results  Component Value Date   HGBA1C 6.0 12/31/2020         PAF (paroxysmal atrial fibrillation) (HCC)    She is wearing an implantable loop recorder and  has had no episodes in over 2 years .  First episode was found during workup for a stroke In June 2020       Relevant Orders   Ambulatory referral to Cardiology   Prediabetes    a1c hadrisen to 6.0  She has deferred use of r Ozempic  For management of obesity  and fatty liver.  Continue statin as well.  Lab Results  Component Value Date   HGBA1C 6.0 12/31/2020         Relevant Orders   HgB A1c (Completed)   Screening for breast cancer   Relevant Orders   MM 3D SCREEN BREAST BILATERAL   Screening for colon cancer - Primary   Relevant Orders   Ambulatory referral to Gastroenterology   Sequelae of cerebral infarction    Numbness of left hand without pain or loss of function.  Can feel heat and cold       Relevant Orders   Ambulatory referral to Cardiology   Other Visit Diagnoses     Other fatigue       Relevant Orders   TSH (Completed)   CBC with Differential/Platelet (Completed)       I am having Mariann Laster  ATamala Julian maintain her rosuvastatin, telmisartan, Eliquis, and hydrochlorothiazide.  No orders of the defined types were placed in this encounter.   There are no discontinued medications.  Follow-up: No follow-ups on file.   Crecencio Mc, MD

## 2022-01-19 NOTE — Assessment & Plan Note (Addendum)

## 2022-01-19 NOTE — Assessment & Plan Note (Signed)
She is wearing an implantable loop recorder and  has had no episodes in over 2 years .  First episode was found during workup for a stroke In June 2020

## 2022-01-19 NOTE — Assessment & Plan Note (Signed)
Well controlled on current regimen of telmisartan hctz . Renal function stable, no changes today.

## 2022-01-26 ENCOUNTER — Ambulatory Visit (INDEPENDENT_AMBULATORY_CARE_PROVIDER_SITE_OTHER): Payer: Medicare Other

## 2022-01-26 DIAGNOSIS — I639 Cerebral infarction, unspecified: Secondary | ICD-10-CM | POA: Diagnosis not present

## 2022-01-28 LAB — CUP PACEART REMOTE DEVICE CHECK
Date Time Interrogation Session: 20230819000500
Date Time Interrogation Session: 20230829081022
Implantable Pulse Generator Implant Date: 20200619
Implantable Pulse Generator Implant Date: 20200619

## 2022-01-30 NOTE — Progress Notes (Signed)
Carelink Summary Report / Loop Recorder 

## 2022-02-13 ENCOUNTER — Ambulatory Visit
Admission: RE | Admit: 2022-02-13 | Discharge: 2022-02-13 | Disposition: A | Discharge status: Home / Self Care | Payer: Medicare Other | Source: Ambulatory Visit | Attending: Internal Medicine | Admitting: Internal Medicine

## 2022-02-13 DIAGNOSIS — Z1231 Encounter for screening mammogram for malignant neoplasm of breast: Secondary | ICD-10-CM | POA: Diagnosis not present

## 2022-02-19 NOTE — Progress Notes (Signed)
Carelink Summary Report / Loop Recorder 

## 2022-02-26 ENCOUNTER — Other Ambulatory Visit: Payer: Self-pay | Admitting: Family

## 2022-03-02 ENCOUNTER — Ambulatory Visit (INDEPENDENT_AMBULATORY_CARE_PROVIDER_SITE_OTHER): Payer: Medicare Other

## 2022-03-02 DIAGNOSIS — I639 Cerebral infarction, unspecified: Secondary | ICD-10-CM | POA: Diagnosis not present

## 2022-03-03 LAB — CUP PACEART REMOTE DEVICE CHECK
Date Time Interrogation Session: 20231001231456
Implantable Pulse Generator Implant Date: 20200619

## 2022-03-18 NOTE — Progress Notes (Signed)
Carelink Summary Report / Loop Recorder 

## 2022-04-06 ENCOUNTER — Ambulatory Visit (INDEPENDENT_AMBULATORY_CARE_PROVIDER_SITE_OTHER): Payer: Medicare Other

## 2022-04-06 DIAGNOSIS — I639 Cerebral infarction, unspecified: Secondary | ICD-10-CM | POA: Diagnosis not present

## 2022-04-08 LAB — CUP PACEART REMOTE DEVICE CHECK
Date Time Interrogation Session: 20231103231705
Implantable Pulse Generator Implant Date: 20200619

## 2022-05-11 ENCOUNTER — Ambulatory Visit: Payer: Medicare Other

## 2022-05-12 LAB — CUP PACEART REMOTE DEVICE CHECK
Date Time Interrogation Session: 20231210231404
Implantable Pulse Generator Implant Date: 20200619

## 2022-05-13 ENCOUNTER — Telehealth: Payer: Self-pay

## 2022-05-13 NOTE — Telephone Encounter (Signed)
ILR @ [facility]   ILR reached [facility]:  Patient called, discussed options to leave device in or explanted.  Patient would like to have device explanted.  Marked "I" in Paceart: Yes Enter note in Paceart: Yes Canceled future remotes:Yes Discontinued from website: Yes  Patient advised to unplug remote monitor and bring in when she comes in for explant and we will return for patient.  Advised if further questions arise to please call the device clinic at [phone].

## 2022-05-13 NOTE — Progress Notes (Signed)
Carelink Summary Report / Loop Recorder 

## 2022-06-09 ENCOUNTER — Telehealth: Payer: Self-pay | Admitting: Family

## 2022-06-09 NOTE — Progress Notes (Unsigned)
Cardiology Office Note  Date:  06/09/2022   ID:  Lisa Green, DOB May 29, 1951, MRN [id number]  PCP:  Crecencio Mc, MD   No chief complaint on file.   HPI:  [name] is a 72 year old woman with past medical history of mild obesity,  hyperlipidemia,  strong family history of coronary artery disease with father having his first heart attack at age 39 as well as a stroke, brother with an MI coronary artery disease as well as many other relatives,   HTN,  CVA in June 2020 with a Linq  implanted at that time.  who presents by referral for evaluation for stroke, PAF  Loop monitor in place    PMH:   has a past medical history of Diverticulosis (2013), Hyperlipidemia, Obesity (BMI 30.0-34.9), and Stroke ([place]) (11/17/2018).  PSH:    Past Surgical History:  Procedure Laterality Date   ABDOMINAL HYSTERECTOMY     with wedge resection of right ovary , normal path   BUBBLE STUDY  11/18/2018   Procedure: BUBBLE STUDY;  Surgeon: [name], MD;  Location: [place] [facility];  Service: Cardiovascular;;   [place]     ENDOVENOUS ABLATION SAPHENOUS VEIN W/ LASER  2008   for varicose veins,  [place], [place]   LOOP RECORDER INSERTION N/A 11/18/2018   Procedure: LOOP RECORDER INSERTION;  Surgeon: Thompson Grayer, MD;  Location: [facility];  Service: Cardiovascular;  Laterality: N/A;   TEE WITHOUT CARDIOVERSION N/A 11/18/2018   Procedure: TRANSESOPHAGEAL ECHOCARDIOGRAM (TEE);  Surgeon: [name], MD;  Location: [facility] ENDOSCOPY;  Service: Cardiovascular;  Laterality: N/A;    Current Outpatient Medications  Medication Sig Dispense Refill   apixaban (ELIQUIS) 5 MG TABS tablet TAKE 1 TABLET BY MOUTH 2 TIMES DAILY. 180 tablet 1   hydrochlorothiazide (HYDRODIURIL) 25 MG tablet TAKE 1 TABLET(25 MG) BY MOUTH DAILY 90 tablet 3   rosuvastatin (CRESTOR) 20 MG tablet TAKE 1 TABLET(20 MG) BY MOUTH DAILY 90 tablet 1    telmisartan (MICARDIS) 80 MG tablet TAKE 1 TABLET(80 MG) BY MOUTH AT BEDTIME 90 tablet 1   No current facility-administered medications for this visit.     Allergies:   Wasp venom protein and Insect extract   Social History:  The patient  reports that she has never smoked. She has never used smokeless tobacco. She reports current alcohol use. She reports that she does not use drugs.   Family History:   family history includes Cancer (age of onset: 40) in her sister; Coronary artery disease in her paternal grandfather, paternal grandmother, and paternal uncle; Coronary artery disease (age of onset: 45) in her father; Heart disease in her brother, mother, paternal grandfather, paternal grandmother, and paternal uncle; Stroke in her father.    Review of Systems: ROS   PHYSICAL EXAM: VS:  There were no vitals taken for this visit. , BMI There is no height or weight on file to calculate BMI. GEN: Well nourished, well developed, in no acute distress HEENT: normal Neck: no JVD, carotid bruits, or masses Cardiac: RRR; no murmurs, rubs, or gallops,no edema  Respiratory:  clear to auscultation bilaterally, normal work of breathing GI: soft, nontender, nondistended, + BS MS: no deformity or atrophy Skin: warm and dry, no rash Neuro:  Strength and sensation are intact Psych: euthymic mood, full affect    Recent Labs: 01/19/2022: ALT 28; BUN 14; Creatinine, Ser 0.68;  Hemoglobin 13.6; Platelets 235.0; Potassium 3.8; Sodium 139; TSH 1.28    Lipid Panel Lab Results  Component Value Date   CHOL 164 01/19/2022   HDL 53.00 01/19/2022   LDLCALC 92 01/19/2022   TRIG 96.0 01/19/2022      Wt Readings from Last 3 Encounters:  01/19/22 184 lb (83.5 kg)  11/04/21 188 lb (85.3 kg)  12/31/20 214 lb (97.1 kg)       ASSESSMENT AND PLAN:  Problem List Items Addressed This Visit   None    Disposition:   F/U  12 months   Total encounter time more than 30 minutes  Greater than 50% was  spent in counseling and coordination of care with the patient    Signed, Esmond Plants, M.D., Ph.D. [place], [place]

## 2022-06-10 ENCOUNTER — Ambulatory Visit: Payer: Medicare Other | Attending: Cardiovascular Disease | Admitting: Cardiovascular Disease

## 2022-06-10 ENCOUNTER — Encounter: Payer: Self-pay | Admitting: Cardiovascular Disease

## 2022-06-10 VITALS — BP 128/64 | HR 67 | Ht 65.0 in | Wt 200.5 lb

## 2022-06-10 DIAGNOSIS — I480 Paroxysmal atrial fibrillation: Secondary | ICD-10-CM

## 2022-06-10 DIAGNOSIS — I10 Essential (primary) hypertension: Secondary | ICD-10-CM

## 2022-06-10 DIAGNOSIS — I6930 Unspecified sequelae of cerebral infarction: Secondary | ICD-10-CM

## 2022-06-10 DIAGNOSIS — R7303 Prediabetes: Secondary | ICD-10-CM

## 2022-06-10 DIAGNOSIS — E785 Hyperlipidemia, unspecified: Secondary | ICD-10-CM

## 2022-06-10 MED ORDER — APIXABAN 5 MG PO TABS: ORAL_TABLET | ORAL | 3 refills | Status: DC

## 2022-06-10 NOTE — Patient Instructions (Signed)
Medication Instructions:  No changes  If you need a refill on your cardiac medications before your next appointment, please call your pharmacy.   Lab work: No new labs needed  Testing/Procedures: No new testing needed  Follow-Up: At [facility], you and your health needs are our priority.  As part of our continuing mission to provide you with exceptional heart care, we have created designated Provider Care Teams.  These Care Teams include your primary Cardiologist (physician) and Advanced Practice Providers (APPs -  Physician Assistants and Nurse Practitioners) who all work together to provide you with the care you need, when you need it.  You will need a follow up appointment in 12 months  Providers on your designated Care Team:   Christopher Berge, NP Ryan Dunn, PA-C Cadence Furth, PA-C  COVID-19 Vaccine Information can be found at: [email or web address] For questions related to vaccine distribution or appointments, please email [email or web address] or call [phone].   

## 2022-06-11 ENCOUNTER — Encounter: Payer: Self-pay | Admitting: Cardiovascular Disease

## 2022-06-11 ENCOUNTER — Ambulatory Visit: Payer: Medicare Other | Attending: Cardiovascular Disease | Admitting: Cardiovascular Disease

## 2022-06-11 VITALS — BP 132/78 | HR 86 | Ht 65.0 in | Wt 200.0 lb

## 2022-06-11 DIAGNOSIS — Z01812 Encounter for preprocedural laboratory examination: Secondary | ICD-10-CM

## 2022-06-11 DIAGNOSIS — I480 Paroxysmal atrial fibrillation: Secondary | ICD-10-CM | POA: Diagnosis not present

## 2022-06-11 DIAGNOSIS — I63311 Cerebral infarction due to thrombosis of right middle cerebral artery: Secondary | ICD-10-CM | POA: Diagnosis not present

## 2022-06-11 LAB — CBC
Hematocrit: 42.1 % (ref 34.0–46.6)
Hemoglobin: 14.3 g/dL (ref 11.1–15.9)
MCH: 32.1 pg (ref 26.6–33.0)
MCHC: 34 g/dL (ref 31.5–35.7)
MCV: 95 fL (ref 79–97)
Platelets: 219 10*3/uL (ref 150–450)
RBC: 4.45 x10E6/uL (ref 3.77–5.28)
RDW: 13.2 % (ref 11.7–15.4)
WBC: 4.3 10*3/uL (ref 3.4–10.8)

## 2022-06-11 LAB — BASIC METABOLIC PANEL
BUN/Creatinine Ratio: 19 (ref 12–28)
BUN: 14 mg/dL (ref 8–27)
CO2: 30 mmol/L — ABNORMAL HIGH (ref 20–29)
Calcium: 10.7 mg/dL — ABNORMAL HIGH (ref 8.7–10.3)
Chloride: 100 mmol/L (ref 96–106)
Creatinine, Ser: 0.75 mg/dL (ref 0.57–1.00)
Glucose: 101 mg/dL — ABNORMAL HIGH (ref 70–99)
Potassium: 4.3 mmol/L (ref 3.5–5.2)
Sodium: 141 mmol/L (ref 134–144)
eGFR: 86 mL/min/{1.73_m2} (ref >59)

## 2022-06-11 NOTE — Progress Notes (Signed)
Electrophysiology Office Note:    Date:  06/11/2022   ID:  Lisa Green, DOB 1951-01-12, MRN [id number]  PCP:  Crecencio Mc, MD   [place] Providers Cardiologist:  None Electrophysiologist:  Melida Quitter, MD     Referring MD: Crecencio Mc, MD   History of Present Illness:    Lisa Green is a 72 y.o. female with a hx listed below, significant for stroke, AF detected by loop recorder, who presents for device and arrhythmia follow-up.  She had a stroke in June 2020.  Medtronic Linq was implanted at that time.  Atrial fibrillation was subsequently detected on the loop monitor.  The patient was asymptomatic and unaware of the A-fib.  Mean heart rate during atrial fibrillation was modestly controlled.  Eliquis was started. There were no plans for rate or rhythm control because burden was so low.  She presents today to have her loop recorder removed.   Past Medical History:  Diagnosis Date   Diverticulosis 2013   colonoscopy, [place]   Hyperlipidemia    Obesity (BMI 30.0-34.9)    Stroke ([place]) 11/17/2018   R MCA cortical, cryptogenic    Past Surgical History:  Procedure Laterality Date   ABDOMINAL HYSTERECTOMY     with wedge resection of right ovary , normal path   BUBBLE STUDY  11/18/2018   Procedure: BUBBLE STUDY;  Surgeon: [name], MD;  Location: [place];  Service: Cardiovascular;;   [place]     ENDOVENOUS ABLATION SAPHENOUS VEIN W/ LASER  2008   for varicose veins,  [place], [place]   LOOP RECORDER INSERTION N/A 11/18/2018   Procedure: LOOP RECORDER INSERTION;  Surgeon: Thompson Grayer, MD;  Location: [place] CV LAB;  Service: Cardiovascular;  Laterality: N/A;   TEE WITHOUT CARDIOVERSION N/A 11/18/2018   Procedure: TRANSESOPHAGEAL ECHOCARDIOGRAM (TEE);  Surgeon: [name], MD;  Location: [facility] ENDOSCOPY;  Service: Cardiovascular;  Laterality: N/A;    Current  Medications: Current Meds  Medication Sig   apixaban (ELIQUIS) 5 MG TABS tablet TAKE 1 TABLET BY MOUTH 2 TIMES DAILY.   hydrochlorothiazide (HYDRODIURIL) 25 MG tablet TAKE 1 TABLET(25 MG) BY MOUTH DAILY   rosuvastatin (CRESTOR) 20 MG tablet TAKE 1 TABLET(20 MG) BY MOUTH DAILY   telmisartan (MICARDIS) 80 MG tablet TAKE 1 TABLET(80 MG) BY MOUTH AT BEDTIME     Allergies:   Wasp venom protein and Insect extract   Social History   Socioeconomic History   Marital status: Married    Spouse name: Not on file   Number of children: Not on file   Years of education: Not on file   Highest education level: Not on file  Occupational History   Occupation: Production assistant, radio    Comment: OT at [employer] Use   Smoking status: Never   Smokeless tobacco: Never  Vaping Use   Vaping Use: Never used  Substance and Sexual Activity   Alcohol use: Yes    Comment: 2 oz red wine daily   Drug use: No   Sexual activity: Not on file  Other Topics Concern   Not on file  Social History Narrative   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   Social Determinants of Health   Financial Resource Strain: Low Risk  (11/04/2021)   Overall Financial Resource Strain (CARDIA)    Difficulty of Paying Living Expenses: Not hard  at all  Food Insecurity: No Food Insecurity (11/04/2021)   Hunger Vital Sign    Worried About Running Out of Food in the Last Year: Never true    Ran Out of Food in the Last Year: Never true  Transportation Needs: No Transportation Needs (11/04/2021)   PRAPARE - Hydrologist (Medical): No    Lack of Transportation (Non-Medical): No  Physical Activity: Insufficiently Active (11/04/2021)   Exercise Vital Sign    Days of Exercise per Week: 3 days    Minutes of Exercise per Session: 40 min  Stress: No Stress Concern Present (11/04/2021)   [place]    Feeling of Stress : Not at all  Social  Connections: Unknown (11/04/2021)   Social Connection and Isolation Panel [NHANES]    Frequency of Communication with Friends and Family: Not on file    Frequency of Social Gatherings with Friends and Family: Not on file    Attends Religious Services: Not on file    Active Member of Clubs or Organizations: Not on file    Attends Archivist Meetings: Not on file    Marital Status: Married     Family History: The patient's family history includes Cancer (age of onset: 32) in her sister; Coronary artery disease in her paternal grandfather, paternal grandmother, and paternal uncle; Coronary artery disease (age of onset: 53) in her father; Heart attack (age of onset: 78) in her brother; Heart disease in her brother, mother, paternal grandfather, paternal grandmother, and paternal uncle; Stroke in her brother and father. There is no history of Breast cancer.  ROS:   Please see the history of present illness.    All other systems reviewed and are negative.  EKGs/Labs/Other Studies Reviewed Today:     Recent Labs: 01/19/2022: ALT 28; TSH 1.28 06/11/2022: BUN 14; Creatinine, Ser 0.75; Hemoglobin 14.3; Platelets 219; Potassium 4.3; Sodium 141     Physical Exam:    VS:  BP 132/78 (BP Location: Right Arm, Cuff Size: Large)   Pulse 86   Ht '5\' 5"'$  (1.651 m)   Wt 200 lb (90.7 kg)   SpO2 98%   BMI 33.28 kg/m     Wt Readings from Last 3 Encounters:  06/11/22 200 lb (90.7 kg)  06/10/22 200 lb 8 oz (90.9 kg)  01/19/22 184 lb (83.5 kg)     GEN: Well nourished, well developed in no acute distress CARDIAC: RRR, no murmurs, rubs, gallops RESPIRATORY:  Normal work of breathing MUSCULOSKELETAL: no edema    ASSESSMENT & PLAN:    Paroxysmal atrial fibrillation: burden is low but rates are borderline controlled, and she is asymptomatic. I think ongoing monitoring is appropriate because she is asymptomatic at present but at risk for progression to persistent AF and CHF due to borderline  rate control. We discussed monitoring options. AliveCor is a suboptimal option because is does not give any sense of AF burden. Apple watch or similar device or replacement of ILR are preferable. She would prefer replacement of the loop recorder. We will schedule her to have the device replaced. Secondary hypercoagulable state: continue eliquis. CBC and CMET 4 mo ago were ok.        Medication Adjustments/Labs and Tests Ordered: Current medicines are reviewed at length with the patient today.  Concerns regarding medicines are outlined above.  Orders Placed This Encounter  Procedures   CBC   Basic metabolic panel   No orders  of the defined types were placed in this encounter.    Signed, Melida Quitter, MD  06/11/2022 12:44 PM    [place]

## 2022-06-11 NOTE — H&P (View-Only) (Signed)
Electrophysiology Office Note:    Date:  06/11/2022   ID:  Lisa Green, DOB 12/17/1950, MRN [id number]  PCP:  Crecencio Mc, MD   [place] Providers Cardiologist:  None Electrophysiologist:  Melida Quitter, MD     Referring MD: Crecencio Mc, MD   History of Present Illness:    Lisa Green is a 72 y.o. female with a hx listed below, significant for stroke, AF detected by loop recorder, who presents for device and arrhythmia follow-up.  She had a stroke in June 2020.  Medtronic Linq was implanted at that time.  Atrial fibrillation was subsequently detected on the loop monitor.  The patient was asymptomatic and unaware of the A-fib.  Mean heart rate during atrial fibrillation was modestly controlled.  Eliquis was started. There were no plans for rate or rhythm control because burden was so low.  She presents today to have her loop recorder removed.   Past Medical History:  Diagnosis Date   Diverticulosis 2013   colonoscopy, [place]   Hyperlipidemia    Obesity (BMI 30.0-34.9)    Stroke ([place]) 11/17/2018   R MCA cortical, cryptogenic    Past Surgical History:  Procedure Laterality Date   ABDOMINAL HYSTERECTOMY     with wedge resection of right ovary , normal path   BUBBLE STUDY  11/18/2018   Procedure: BUBBLE STUDY;  Surgeon: [name], MD;  Location: [place] ENDOSCOPY;  Service: Cardiovascular;;   [place]     ENDOVENOUS ABLATION SAPHENOUS VEIN W/ LASER  2008   for varicose veins,  [place], [place]   LOOP RECORDER INSERTION N/A 11/18/2018   Procedure: LOOP RECORDER INSERTION;  Surgeon: Thompson Grayer, MD;  Location: [place] CV LAB;  Service: Cardiovascular;  Laterality: N/A;   TEE WITHOUT CARDIOVERSION N/A 11/18/2018   Procedure: TRANSESOPHAGEAL ECHOCARDIOGRAM (TEE);  Surgeon: [name], MD;  Location: [facility] ENDOSCOPY;  Service: Cardiovascular;  Laterality: N/A;    Current  Medications: Current Meds  Medication Sig   apixaban (ELIQUIS) 5 MG TABS tablet TAKE 1 TABLET BY MOUTH 2 TIMES DAILY.   hydrochlorothiazide (HYDRODIURIL) 25 MG tablet TAKE 1 TABLET(25 MG) BY MOUTH DAILY   rosuvastatin (CRESTOR) 20 MG tablet TAKE 1 TABLET(20 MG) BY MOUTH DAILY   telmisartan (MICARDIS) 80 MG tablet TAKE 1 TABLET(80 MG) BY MOUTH AT BEDTIME     Allergies:   Wasp venom protein and Insect extract   Social History   Socioeconomic History   Marital status: Married    Spouse name: Not on file   Number of children: Not on file   Years of education: Not on file   Highest education level: Not on file  Occupational History   Occupation: Production assistant, radio    Comment: OT at [employer] Use   Smoking status: Never   Smokeless tobacco: Never  Vaping Use   Vaping Use: Never used  Substance and Sexual Activity   Alcohol use: Yes    Comment: 2 oz red wine daily   Drug use: No   Sexual activity: Not on file  Other Topics Concern   Not on file  Social History Narrative   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   Social Determinants of Health   Financial Resource Strain: Low Risk  (11/04/2021)   Overall Financial Resource Strain (CARDIA)    Difficulty of Paying Living Expenses: Not hard  at all  Food Insecurity: No Food Insecurity (11/04/2021)   Hunger Vital Sign    Worried About Running Out of Food in the Last Year: Never true    Ran Out of Food in the Last Year: Never true  Transportation Needs: No Transportation Needs (11/04/2021)   PRAPARE - Hydrologist (Medical): No    Lack of Transportation (Non-Medical): No  Physical Activity: Insufficiently Active (11/04/2021)   Exercise Vital Sign    Days of Exercise per Week: 3 days    Minutes of Exercise per Session: 40 min  Stress: No Stress Concern Present (11/04/2021)   [place]    Feeling of Stress : Not at all  Social  Connections: Unknown (11/04/2021)   Social Connection and Isolation Panel [NHANES]    Frequency of Communication with Friends and Family: Not on file    Frequency of Social Gatherings with Friends and Family: Not on file    Attends Religious Services: Not on file    Active Member of Clubs or Organizations: Not on file    Attends Archivist Meetings: Not on file    Marital Status: Married     Family History: The patient's family history includes Cancer (age of onset: 62) in her sister; Coronary artery disease in her paternal grandfather, paternal grandmother, and paternal uncle; Coronary artery disease (age of onset: 47) in her father; Heart attack (age of onset: 74) in her brother; Heart disease in her brother, mother, paternal grandfather, paternal grandmother, and paternal uncle; Stroke in her brother and father. There is no history of Breast cancer.  ROS:   Please see the history of present illness.    All other systems reviewed and are negative.  EKGs/Labs/Other Studies Reviewed Today:     Recent Labs: 01/19/2022: ALT 28; TSH 1.28 06/11/2022: BUN 14; Creatinine, Ser 0.75; Hemoglobin 14.3; Platelets 219; Potassium 4.3; Sodium 141     Physical Exam:    VS:  BP 132/78 (BP Location: Right Arm, Cuff Size: Large)   Pulse 86   Ht '5\' 5"'$  (1.651 m)   Wt 200 lb (90.7 kg)   SpO2 98%   BMI 33.28 kg/m     Wt Readings from Last 3 Encounters:  06/11/22 200 lb (90.7 kg)  06/10/22 200 lb 8 oz (90.9 kg)  01/19/22 184 lb (83.5 kg)     GEN: Well nourished, well developed in no acute distress CARDIAC: RRR, no murmurs, rubs, gallops RESPIRATORY:  Normal work of breathing MUSCULOSKELETAL: no edema    ASSESSMENT & PLAN:    Paroxysmal atrial fibrillation: burden is low but rates are borderline controlled, and she is asymptomatic. I think ongoing monitoring is appropriate because she is asymptomatic at present but at risk for progression to persistent AF and CHF due to borderline  rate control. We discussed monitoring options. AliveCor is a suboptimal option because is does not give any sense of AF burden. Apple watch or similar device or replacement of ILR are preferable. She would prefer replacement of the loop recorder. We will schedule her to have the device replaced. Secondary hypercoagulable state: continue eliquis. CBC and CMET 4 mo ago were ok.        Medication Adjustments/Labs and Tests Ordered: Current medicines are reviewed at length with the patient today.  Concerns regarding medicines are outlined above.  Orders Placed This Encounter  Procedures   CBC   Basic metabolic panel   No orders  of the defined types were placed in this encounter.    Signed, Melida Quitter, MD  06/11/2022 12:44 PM    [place]

## 2022-06-11 NOTE — Patient Instructions (Addendum)
Medication Instructions:  Your physician recommends that you continue on your current medications as directed. Please refer to the Current Medication list given to you today.  *If you need a refill on your cardiac medications before your next appointment, please call your pharmacy*  Lab Work: TODAY: CBC, BMET If you have labs (blood work) drawn today and your tests are completely normal, you will receive your results only by: [place] (if you have MyChart) OR A paper copy in the mail If you have any lab test that is abnormal or we need to change your treatment, we will call you to review the results.  Testing/Procedures: You have been scheduled for a loop recorder explant and re-implantation, see instructions below.  Follow-Up: At [facility], you and your health needs are our priority.  As part of our continuing mission to provide you with exceptional heart care, we have created designated Provider Care Teams.  These Care Teams include your primary Cardiologist (physician) and Advanced Practice Providers (APPs -  Physician Assistants and Nurse Practitioners) who all work together to provide you with the care you need, when you need it.  Your next appointment:   6 month(s)  Provider:   You will see one of the following Advanced Practice Providers on your designated Care Team:   Tommye Standard, [place] Beryle Beams" Chalmers Cater, [place]  Other Instructions You are scheduled for a loop recorder explant with re-implantation on Wednesday June 17, 2022 with Dr. Doralee Albino at 11:30 am. You will need to arrive at [facility] at 10:30 am.  Do not eat or drink anything after midnight.  You will need to hold your Eliquis the night before (Tuesday January 16th) and the morning of your procedure.      Implantable Loop Recorder Removal, Care After This sheet gives you information about how to care for yourself after your procedure. Your health care provider may also  give you more specific instructions. If you have problems or questions, contact your health care provider. What can I expect after the procedure? After the procedure, it is common to have: Soreness or discomfort near the incision. Some swelling or bruising near the incision.  Follow these instructions at home: Incision care  Monitor your cardiac device site for redness, swelling, and drainage. Call the device clinic at [phone] if you experience these symptoms or fever/chills.  Keep the large square bandage on your site for 24 hours and then you may remove it yourself. Keep the steri-strips underneath in place.   You may shower after 72 hours / 3 days from your procedure with the steri-strips in place. They will usually fall off on their own, or may be removed after 10 days. Pat dry.   Avoid lotions, ointments, or perfumes over your incision until it is well-healed.  Please do not submerge in water until your site is completely healed.   If your wound site starts to bleed apply pressure.       If you have any questions/concerns please call the device clinic at [phone].  Activity  Return to your normal activities.  Contact a health care provider if: You have redness, swelling, or pain around your incision. You have a fever.

## 2022-06-12 NOTE — Telephone Encounter (Signed)
Medication has been refilled.

## 2022-06-12 NOTE — Telephone Encounter (Signed)
Prescription Request    Patient is out of her Medication   06/12/2022  Is this a "Controlled Substance" medicine? No  LOV: Visit date not found  What is the name of the medication or equipment? telmisartan (MICARDIS) 80 MG tablet  Have you contacted your pharmacy to request a refill? Yes   Which pharmacy would you like this sent to?   [id number] [address], [place] AT [facility] OF [address] [place] [address] Phone: [phone] Fax: [phone]   Patient notified that their request is being sent to the clinical staff for review and that they should receive a response within 2 business days.   Please advise at Mobile [phone] (mobile)

## 2022-06-17 ENCOUNTER — Other Ambulatory Visit: Payer: Self-pay

## 2022-06-17 ENCOUNTER — Ambulatory Visit (HOSPITAL_COMMUNITY)
Admission: RE | Admit: 2022-06-17 | Discharge: 2022-06-17 | Disposition: A | Discharge status: Home / Self Care | Payer: Medicare Other | Attending: Cardiovascular Disease | Admitting: Cardiovascular Disease

## 2022-06-17 ENCOUNTER — Encounter (HOSPITAL_COMMUNITY)
Admission: RE | Disposition: A | Discharge status: Home / Self Care | Payer: Self-pay | Source: Home / Self Care | Attending: Cardiovascular Disease

## 2022-06-17 DIAGNOSIS — D6869 Other thrombophilia: Secondary | ICD-10-CM | POA: Diagnosis not present

## 2022-06-17 DIAGNOSIS — Z7901 Long term (current) use of anticoagulants: Secondary | ICD-10-CM | POA: Insufficient documentation

## 2022-06-17 DIAGNOSIS — Z8673 Personal history of transient ischemic attack (TIA), and cerebral infarction without residual deficits: Secondary | ICD-10-CM | POA: Insufficient documentation

## 2022-06-17 DIAGNOSIS — I4891 Unspecified atrial fibrillation: Secondary | ICD-10-CM | POA: Diagnosis not present

## 2022-06-17 DIAGNOSIS — I480 Paroxysmal atrial fibrillation: Secondary | ICD-10-CM | POA: Diagnosis not present

## 2022-06-17 HISTORY — PX: LOOP RECORDER INSERTION: EP1214

## 2022-06-17 HISTORY — PX: LOOP RECORDER REMOVAL: EP1215

## 2022-06-17 SURGERY — LOOP RECORDER INSERTION

## 2022-06-17 MED ORDER — LIDOCAINE-EPINEPHRINE 1 %-1:100000 IJ SOLN
INTRAMUSCULAR | Status: DC | PRN
  Administered 2022-06-17: 10 mL

## 2022-06-17 MED ORDER — LIDOCAINE-EPINEPHRINE 1 %-1:100000 IJ SOLN
INTRAMUSCULAR | Status: AC
  Filled 2022-06-17: qty 1

## 2022-06-17 SURGICAL SUPPLY — 2 items
PACK LOOP INSERTION (CUSTOM PROCEDURE TRAY) ×1 IMPLANT
SYSTEM MONITOR REVEAL LINQ II (Prosthesis & Implant Heart) IMPLANT

## 2022-06-17 NOTE — Interval H&P Note (Signed)
History and Physical Interval Note:  06/17/2022 11:12 AM  Lisa Green  has presented today for surgery, with the diagnosis of afib.  The various methods of treatment have been discussed with the patient and family. After consideration of risks, benefits and other options for treatment, the patient has consented to  Procedure(s): LOOP RECORDER INSERTION (N/A) LOOP RECORDER REMOVAL (N/A) as a surgical intervention.  The patient's history has been reviewed, patient examined, no change in status, stable for surgery.  I have reviewed the patient's chart and labs.  Questions were answered to the patient's satisfaction.     Myda Detwiler E Abem Shaddix  Mallampati II, ASA I -- no anesthesia issues if we need to use moderate sedation.

## 2022-06-18 ENCOUNTER — Encounter (HOSPITAL_COMMUNITY): Payer: Self-pay | Admitting: Cardiovascular Disease

## 2022-06-22 DIAGNOSIS — K080 Exfoliation of teeth due to systemic causes: Secondary | ICD-10-CM | POA: Diagnosis not present

## 2022-07-09 ENCOUNTER — Encounter (HOSPITAL_COMMUNITY): Payer: Self-pay | Admitting: *Deleted

## 2022-08-04 ENCOUNTER — Ambulatory Visit (INDEPENDENT_AMBULATORY_CARE_PROVIDER_SITE_OTHER): Payer: Medicare Other

## 2022-08-04 DIAGNOSIS — I63311 Cerebral infarction due to thrombosis of right middle cerebral artery: Secondary | ICD-10-CM

## 2022-08-05 LAB — CUP PACEART REMOTE DEVICE CHECK
Date Time Interrogation Session: 20240305174355
Implantable Pulse Generator Implant Date: 20240117

## 2022-08-11 DIAGNOSIS — Z1211 Encounter for screening for malignant neoplasm of colon: Secondary | ICD-10-CM | POA: Diagnosis not present

## 2022-08-11 DIAGNOSIS — Z7901 Long term (current) use of anticoagulants: Secondary | ICD-10-CM | POA: Diagnosis not present

## 2022-08-13 ENCOUNTER — Telehealth: Payer: Self-pay

## 2022-08-13 ENCOUNTER — Encounter: Payer: Self-pay | Admitting: Internal Medicine

## 2022-08-13 NOTE — Telephone Encounter (Signed)
Sent through [employer] directions for stopping Eliquis for her colonoscopy.

## 2022-08-13 NOTE — Telephone Encounter (Signed)
-----   Message from Crecencio Mc, MD sent at 08/13/2022  2:05 PM EDT ----- Regarding: eliquis suspension Lisa Green,  Please suspend your Eliquis on April 20th (last dose on April  20) in preparation for your colonoscopy on April 22.  You can plan to resume it on April 24, unless the gastroenterologist advised you differently   Regards,   Deborra Medina, MD

## 2022-09-07 ENCOUNTER — Ambulatory Visit (INDEPENDENT_AMBULATORY_CARE_PROVIDER_SITE_OTHER): Payer: Medicare Other

## 2022-09-07 DIAGNOSIS — I63311 Cerebral infarction due to thrombosis of right middle cerebral artery: Secondary | ICD-10-CM

## 2022-09-07 LAB — CUP PACEART REMOTE DEVICE CHECK
Date Time Interrogation Session: 20240407174213
Implantable Pulse Generator Implant Date: 20240117

## 2022-09-11 NOTE — Progress Notes (Signed)
Carelink Summary Report / Loop Recorder 

## 2022-09-21 ENCOUNTER — Ambulatory Visit: Payer: Medicare Other

## 2022-09-21 ENCOUNTER — Other Ambulatory Visit: Payer: Self-pay | Admitting: Internal Medicine

## 2022-09-21 DIAGNOSIS — Z1211 Encounter for screening for malignant neoplasm of colon: Secondary | ICD-10-CM | POA: Diagnosis not present

## 2022-09-21 DIAGNOSIS — K640 First degree hemorrhoids: Secondary | ICD-10-CM | POA: Diagnosis not present

## 2022-10-12 ENCOUNTER — Other Ambulatory Visit: Payer: Self-pay

## 2022-10-12 ENCOUNTER — Ambulatory Visit (INDEPENDENT_AMBULATORY_CARE_PROVIDER_SITE_OTHER): Payer: Medicare Other

## 2022-10-12 DIAGNOSIS — I63311 Cerebral infarction due to thrombosis of right middle cerebral artery: Secondary | ICD-10-CM

## 2022-10-12 MED ORDER — TELMISARTAN 80 MG PO TABS: ORAL_TABLET | ORAL | 1 refills | Status: DC

## 2022-10-13 LAB — CUP PACEART REMOTE DEVICE CHECK
Date Time Interrogation Session: 20240510174214
Implantable Pulse Generator Implant Date: 20240117

## 2022-10-15 NOTE — Progress Notes (Signed)
Carelink Summary Report / Loop Recorder 

## 2022-10-28 ENCOUNTER — Telehealth: Payer: Self-pay | Admitting: Internal Medicine

## 2022-10-28 NOTE — Telephone Encounter (Signed)
Copied from CRM [id number]. Topic: Medicare AWV >> Oct 28, 2022  3:20 PM Payton Doughty wrote: Reason for CRM: LM 10/28/2022 to schedule AWV   Verlee Rossetti; Care Guide Ambulatory Clinical Support [place] [facility] Direct Dial: [phone]

## 2022-11-09 NOTE — Progress Notes (Signed)
Carelink Summary Report / Loop Recorder 

## 2022-11-16 ENCOUNTER — Ambulatory Visit (INDEPENDENT_AMBULATORY_CARE_PROVIDER_SITE_OTHER): Payer: Medicare Other

## 2022-11-16 DIAGNOSIS — I63311 Cerebral infarction due to thrombosis of right middle cerebral artery: Secondary | ICD-10-CM

## 2022-11-16 LAB — CUP PACEART REMOTE DEVICE CHECK
Date Time Interrogation Session: 20240616230911
Implantable Pulse Generator Implant Date: 20240117

## 2022-12-08 NOTE — Progress Notes (Signed)
Carelink Summary Report / Loop Recorder 

## 2022-12-21 ENCOUNTER — Ambulatory Visit (INDEPENDENT_AMBULATORY_CARE_PROVIDER_SITE_OTHER): Payer: Medicare Other

## 2022-12-21 DIAGNOSIS — I639 Cerebral infarction, unspecified: Secondary | ICD-10-CM | POA: Diagnosis not present

## 2022-12-22 LAB — CUP PACEART REMOTE DEVICE CHECK
Date Time Interrogation Session: 20240719230325
Implantable Pulse Generator Implant Date: 20240117

## 2022-12-24 DIAGNOSIS — K080 Exfoliation of teeth due to systemic causes: Secondary | ICD-10-CM | POA: Diagnosis not present

## 2023-01-08 NOTE — Progress Notes (Signed)
Carelink Summary Report / Loop Recorder 

## 2023-01-12 DIAGNOSIS — H43813 Vitreous degeneration, bilateral: Secondary | ICD-10-CM | POA: Diagnosis not present

## 2023-01-12 DIAGNOSIS — H2513 Age-related nuclear cataract, bilateral: Secondary | ICD-10-CM | POA: Diagnosis not present

## 2023-01-12 DIAGNOSIS — H35373 Puckering of macula, bilateral: Secondary | ICD-10-CM | POA: Diagnosis not present

## 2023-01-21 LAB — CUP PACEART REMOTE DEVICE CHECK
Date Time Interrogation Session: 20240821230606
Implantable Pulse Generator Implant Date: 20240117

## 2023-01-25 ENCOUNTER — Ambulatory Visit (INDEPENDENT_AMBULATORY_CARE_PROVIDER_SITE_OTHER): Payer: Medicare Other

## 2023-01-25 DIAGNOSIS — I639 Cerebral infarction, unspecified: Secondary | ICD-10-CM | POA: Diagnosis not present

## 2023-02-03 NOTE — Progress Notes (Signed)
Carelink Summary Report / Loop Recorder 

## 2023-02-04 ENCOUNTER — Telehealth: Payer: Self-pay | Admitting: Internal Medicine

## 2023-02-04 NOTE — Telephone Encounter (Signed)
Copied from CRM [id number]. Topic: Medicare AWV >> Feb 04, 2023  9:41 AM Lisa Green wrote: Reason for CRM: LM 02/04/2023 to schedule AWV   Lisa Green; Care Guide Ambulatory Clinical Support [place] [facility] Direct Dial: [phone]

## 2023-02-12 ENCOUNTER — Ambulatory Visit (INDEPENDENT_AMBULATORY_CARE_PROVIDER_SITE_OTHER): Payer: Medicare Other | Admitting: *Deleted

## 2023-02-12 VITALS — Ht 65.0 in | Wt 210.0 lb

## 2023-02-12 DIAGNOSIS — Z1231 Encounter for screening mammogram for malignant neoplasm of breast: Secondary | ICD-10-CM

## 2023-02-12 DIAGNOSIS — Z0000 Encounter for general adult medical examination without abnormal findings: Secondary | ICD-10-CM | POA: Diagnosis not present

## 2023-02-12 NOTE — Patient Instructions (Signed)
Lisa Green , Thank you for taking time to come for your Medicare Wellness Visit. I appreciate your ongoing commitment to your health goals. Please review the following plan we discussed and let me know if I can assist you in the future.   Referrals/Orders/Follow-Ups/Clinician Recommendations: Mammogram  This is a list of the screening recommended for you and due dates:  Health Maintenance  Topic Date Due   Zoster (Shingles) Vaccine (2 of 2) 02/04/2022   Flu Shot  12/31/2022   COVID-19 Vaccine (4 - 2023-24 season) 01/31/2023   Mammogram  02/14/2023   Medicare Annual Wellness Visit  02/12/2024   DTaP/Tdap/Td vaccine (3 - Td or Tdap) 12/14/2030   Colon Cancer Screening  09/21/2032   Pneumonia Vaccine  Completed   DEXA scan (bone density measurement)  Completed   HPV Vaccine  Aged Out    Advanced directives: (Copy Requested) Please bring a copy of your health care power of attorney and living will to the office to be added to your chart at your convenience.  Next Medicare Annual Wellness Visit scheduled for next year: Yes 02/16/24 @ 9:00

## 2023-02-12 NOTE — Progress Notes (Signed)
Subjective:   Lisa Green is a 72 y.o. female who presents for Medicare Annual (Subsequent) preventive examination.  Visit Complete: Virtual  I connected with  Andy Gauss on 02/12/23 by a audio enabled telemedicine application and verified that I am speaking with the correct person using two identifiers.  Patient Location: Home  Provider Location: Home Office  I discussed the limitations of evaluation and management by telemedicine. The patient expressed understanding and agreed to proceed.  Vital Signs: Unable to obtain new vitals due to this being a telehealth visit.    Systems Review Cardiac Risk Factors include: advanced age (>35men, >51 women);dyslipidemia;hypertension;Other (see comment), Risk factor comments: PAF     Objective:    Today's Vitals   02/12/23 0933  Weight: 210 lb (95.3 kg)  Height: 5\' 5"  (1.651 m)   Body mass index is 34.95 kg/m.     02/12/2023    9:45 AM 06/17/2022   10:45 AM 11/04/2021   10:54 AM 11/16/2018   10:00 PM  Advanced Directives  Does Patient Have a Medical Advance Directive? Yes Yes Yes Yes  Type of Estate agent of [place];Living will Healthcare Power of [place];Living will Healthcare Power of [place];Living will Living will;Healthcare Power of Attorney  Does patient want to make changes to medical advance directive?  No - Patient declined No - Patient declined No - Patient declined  Copy of Healthcare Power of Attorney in Chart? No - copy requested No - copy requested No - copy requested     Current Medications (verified) Outpatient Encounter Medications as of 02/12/2023  Medication Sig   apixaban (ELIQUIS) 5 MG TABS tablet TAKE 1 TABLET BY MOUTH 2 TIMES DAILY.   Cholecalciferol (VITAMIN D-3 PO) Take 1 tablet by mouth every evening.   hydrochlorothiazide (HYDRODIURIL) 25 MG tablet TAKE 1 TABLET(25 MG) BY MOUTH DAILY   rosuvastatin (CRESTOR) 20 MG tablet TAKE 1 TABLET(20 MG) BY MOUTH DAILY (Patient taking  differently: Take 20 mg by mouth at bedtime.)   telmisartan (MICARDIS) 80 MG tablet TAKE 1 TABLET(80 MG) BY MOUTH AT BEDTIME   No facility-administered encounter medications on file as of 02/12/2023.    Allergies (verified) Wasp venom protein and Insect extract   History: Past Medical History:  Diagnosis Date   Diverticulosis 2013   colonoscopy, [place]   Hyperlipidemia    Obesity (BMI 30.0-34.9)    Stroke (HCC) 11/17/2018   R MCA cortical, cryptogenic   Past Surgical History:  Procedure Laterality Date   ABDOMINAL HYSTERECTOMY     with wedge resection of right ovary , normal path   BUBBLE STUDY  11/18/2018   Procedure: BUBBLE STUDY;  Surgeon: Chrystie Nose, MD;  Location: [facility] ENDOSCOPY;  Service: Cardiovascular;;   CESAREAN SECTION  1981   Lost Baby   CESAREAN SECTION  1983   COSMETIC SURGERY     ENDOVENOUS ABLATION SAPHENOUS VEIN W/ LASER  2008   for varicose veins,  [place], [place]   LOOP RECORDER INSERTION N/A 11/18/2018   Procedure: LOOP RECORDER INSERTION;  Surgeon: Hillis Range, MD;  Location: [facility] INVASIVE CV LAB;  Service: Cardiovascular;  Laterality: N/A;   LOOP RECORDER INSERTION N/A 06/17/2022   Procedure: LOOP RECORDER INSERTION;  Surgeon: Maurice Small, MD;  Location: [facility];  Service: Cardiovascular;  Laterality: N/A;   LOOP RECORDER REMOVAL N/A 06/17/2022   Procedure: LOOP RECORDER REMOVAL;  Surgeon: Maurice Small, MD;  Location: [facility]VAS[facility];  Service: Cardiovascular;  Laterality:  N/A;   TEE WITHOUT CARDIOVERSION N/A 11/18/2018   Procedure: TRANSESOPHAGEAL ECHOCARDIOGRAM (TEE);  Surgeon: Chrystie Nose, MD;  Location[facility] ENDOSCOPY;  Service: Cardiovascular;  Laterality: N/A;   Family History  Problem Relation Age of Onset   Heart disease Mother    Coronary artery disease Father 62   Stroke Father    Cancer Sister 27       ovarian ca   Heart attack Brother 76   Heart disease Brother    Stroke Brother    Coronary artery disease  Paternal Uncle    Heart disease Paternal Uncle    Coronary artery disease Paternal Grandmother    Heart disease Paternal Grandmother    Coronary artery disease Paternal Grandfather    Heart disease Paternal Grandfather    Breast cancer Neg Hx    Social History   Socioeconomic History   Marital status: Married    Spouse name: Not on file   Number of children: Not on file   Years of education: Not on file   Highest education level: Not on file  Occupational History   Occupation: Teacher, adult education    Comment: OT at [employer]  Tobacco Use   Smoking status: Never   Smokeless tobacco: Never  Vaping Use   Vaping status: Never Used  Substance and Sexual Activity   Alcohol use: Yes    Comment: 2 oz red wine daily   Drug use: No   Sexual activity: Not on file  Other Topics Concern   Not on file  Social History Narrative   Retired Engineer, 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   Married, works part time   Chemical engineer Strain: Low Risk  (02/12/2023)   Overall Financial Resource Strain (CARDIA)    Difficulty of Paying Living Expenses: Not hard at all  Food Insecurity: No Food Insecurity (02/12/2023)   Hunger Vital Sign    Worried About Running Out of Food in the Last Year: Never true    Ran Out of Food in the Last Year: Never true  Transportation Needs: No Transportation Needs (02/12/2023)   PRAPARE - Administrator, Civil Service (Medical): No    Lack of Transportation (Non-Medical): No  Physical Activity: Sufficiently Active (02/12/2023)   Exercise Vital Sign    Days of Exercise per Week: 7 days    Minutes of Exercise per Session: 40 min  Stress: No Stress Concern Present (02/12/2023)   [employer] of Occupational Health - Occupational Stress Questionnaire    Feeling of Stress : Not at all  Social Connections: Socially Integrated (02/12/2023)   Social Connection and Isolation Panel [NHANES]    Frequency of Communication with Friends  and Family: More than three times a week    Frequency of Social Gatherings with Friends and Family: Three times a week    Attends Religious Services: More than 4 times per year    Active Member of Clubs or Organizations: Yes    Attends Engineer, structural: More than 4 times per year    Marital Status: Married    Tobacco Counseling Counseling given: Not Answered   Clinical Intake:  Pre-visit preparation completed: Yes  Pain : No/denies pain     BMI - recorded: 34.95 Nutritional Status: BMI > 30  Obese Nutritional Risks: None Diabetes: No  How often do you need to have someone help you when you read instructions, pamphlets, or other written materials from your doctor or pharmacy?: 1 -  Never  Interpreter Needed?: No  Information entered by :: R. Dezmon Conover LPN   Activities of Daily Living    02/12/2023    9:34 AM  In your present state of health, do you have any difficulty performing the following activities:  Hearing? 0  Vision? 0  Difficulty concentrating or making decisions? 0  Walking or climbing stairs? 0  Dressing or bathing? 0  Doing errands, shopping? 0  Preparing Food and eating ? N  Using the Toilet? N  In the past six months, have you accidently leaked urine? N  Do you have problems with loss of bowel control? N  Managing your Medications? N  Managing your Finances? N  Housekeeping or managing your Housekeeping? N    Patient Care Team: Sherlene Shams, MD as PCP - General (Internal Medicine) Mealor, Roberts Gaudy, MD as PCP - Electrophysiology (Cardiology) Sherlene Shams, MD (Internal Medicine)  Indicate any recent Medical Services you may have received from other than [facility] providers in the past year (date may be approximate).     Assessment:   This is a routine wellness examination for Kyler.  Hearing/Vision screen Hearing Screening - Comments:: No issues Vision Screening - Comments:: No issues   Goals Addressed             This  Visit's Progress    Patient Stated       Wants to lose about 15 pounds       Depression Screen    02/12/2023    9:41 AM 01/19/2022   10:18 AM 11/04/2021   10:52 AM 12/31/2020    3:28 PM 05/20/2020    8:12 AM 12/22/2018   10:10 AM 05/30/2018   11:16 AM  PHQ 2/9 Scores  PHQ - 2 Score 0 0 0 0 0 0 2  PHQ- 9 Score 0      4    Fall Risk    02/12/2023    9:36 AM 01/19/2022   10:18 AM 11/04/2021   10:52 AM 12/31/2020    3:28 PM 05/20/2020    8:12 AM  Fall Risk   Falls in the past year? 1 0 0 0 0  Number falls in past yr: 0  0  0  Injury with Fall? 1    0  Comment brusing      Risk for fall due to : History of fall(s);Impaired balance/gait No Fall Risks     Follow up Falls prevention discussed;Falls evaluation completed Falls evaluation completed Falls evaluation completed Falls evaluation completed Falls evaluation completed    MEDICARE RISK AT HOME: Medicare Risk at Home Any stairs in or around the home?: No If so, are there any without handrails?: No Home free of loose throw rugs in walkways, pet beds, electrical cords, etc?: Yes Adequate lighting in your home to reduce risk of falls?: Yes Life alert?: No Use of a cane, walker or w/c?: No Grab bars in the bathroom?: No Shower chair or bench in shower?: No Elevated toilet seat or a handicapped toilet?: No     Cognitive Function:        02/12/2023    9:46 AM  6CIT Screen  What Year? 0 points  What month? 0 points  What time? 0 points  Count back from 20 0 points  Months in reverse 0 points  Repeat phrase 0 points  Total Score 0 points    Immunizations Immunization History  Administered Date(s) Administered   Influenza Split 02/23/2013, 02/21/2015   Influenza-Unspecified  03/01/2014, 02/15/2017, 02/21/2018, 02/27/2019, 04/01/2020   PFIZER(Purple Top)SARS-COV-2 Vaccination 06/23/2019, 07/21/2019, 03/22/2020   Pneumococcal Conjugate-13 04/07/2017   Pneumococcal Polysaccharide-23 05/30/2018   Tdap 06/19/2009,  12/13/2020   Zoster Recombinant(Shingrix) 12/10/2021   Zoster, Live 10/30/2012    TDAP status: Up to date  Flu Vaccine status: Due, Education has been provided regarding the importance of this vaccine. Advised may receive this vaccine at local pharmacy or Health Dept. Aware to provide a copy of the vaccination record if obtained from local pharmacy or Health Dept. Verbalized acceptance and understanding.  Pneumococcal vaccine status: Up to date  Covid-19 vaccine status: Information provided on how to obtain vaccines.   Qualifies for Shingles Vaccine? Yes   Zostavax completed Yes   Shingrix Completed?: Yes  Screening Tests Health Maintenance  Topic Date Due   Zoster Vaccines- Shingrix (2 of 2) 02/04/2022   Medicare Annual Wellness (AWV)  11/05/2022   INFLUENZA VACCINE  12/31/2022   COVID-19 Vaccine (4 - 2023-24 season) 01/31/2023   MAMMOGRAM  02/14/2023   DTaP/Tdap/Td (3 - Td or Tdap) 12/14/2030   Colonoscopy  09/21/2032   Pneumonia Vaccine 62+ Years old  Completed   DEXA SCAN  Completed   HPV VACCINES  Aged Out    Health Maintenance  Health Maintenance Due  Topic Date Due   Zoster Vaccines- Shingrix (2 of 2) 02/04/2022   Medicare Annual Wellness (AWV)  11/05/2022   INFLUENZA VACCINE  12/31/2022   COVID-19 Vaccine (4 - 2023-24 season) 01/31/2023    Colorectal cancer screening: Type of screening: Colonoscopy. Completed 4/24. Repeat every 10 years  No longer required because of age  Mammogram status: Ordered 02/12/23. Pt provided with contact info and advised to call to schedule appt.   Bone Density status: Completed 7/21. Results reflect: Bone density results: OSTEOPENIA. Repeat every 2 years. Wants to discuss with PCP  Lung Cancer Screening: (Low Dose CT Chest recommended if Age 21-80 years, 20 pack-year currently smoking OR have quit w/in 15years.) does not qualify.     Additional Screening:  Hepatitis C Screening: does qualify; Completed Will discuss with  PCP  Vision Screening: Recommended annual ophthalmology exams for early detection of glaucoma and other disorders of the eye. Is the patient up to date with their annual eye exam?  Yes  Who is the provider or what is the name of the office in which the patient attends annual eye exams? [facility] If pt is not established with a provider, would they like to be referred to a provider to establish care? No .   Dental Screening: Recommended annual dental exams for proper oral hygiene   Community Resource Referral / Chronic Care Management: CRR required this visit?  No   CCM required this visit?  No     Plan:     I have personally reviewed and noted the following in the patient's chart:   Medical and social history Use of alcohol, tobacco or illicit drugs  Current medications and supplements including opioid prescriptions. Patient is not currently taking opioid prescriptions. Functional ability and status Nutritional status Physical activity Advanced directives List of other physicians Hospitalizations, surgeries, and ER visits in previous 12 months Vitals Screenings to include cognitive, depression, and falls Referrals and appointments  In addition, I have reviewed and discussed with patient certain preventive protocols, quality metrics, and best practice recommendations. A written personalized care plan for preventive services as well as general preventive health recommendations were provided to patient.     Sydell Axon, LPN  02/12/2023   After Visit Summary: (MyChart) Due to this being a telephonic visit, the after visit summary with patients personalized plan was offered to patient via MyChart   Nurse Notes: None

## 2023-02-24 LAB — CUP PACEART REMOTE DEVICE CHECK
Date Time Interrogation Session: 20240923230600
Implantable Pulse Generator Implant Date: 20240117

## 2023-02-24 NOTE — Addendum Note (Signed)
Addended by: Sydell Axon C on: 02/24/2023 03:08 PM   Modules accepted: Orders

## 2023-03-01 ENCOUNTER — Ambulatory Visit (INDEPENDENT_AMBULATORY_CARE_PROVIDER_SITE_OTHER): Payer: Medicare Other

## 2023-03-01 DIAGNOSIS — I639 Cerebral infarction, unspecified: Secondary | ICD-10-CM | POA: Diagnosis not present

## 2023-03-16 NOTE — Progress Notes (Signed)
Carelink Summary Report / Loop Recorder 

## 2023-03-30 LAB — CUP PACEART REMOTE DEVICE CHECK
Date Time Interrogation Session: 20241026230902
Implantable Pulse Generator Implant Date: 20240117

## 2023-04-05 ENCOUNTER — Ambulatory Visit (INDEPENDENT_AMBULATORY_CARE_PROVIDER_SITE_OTHER): Payer: Medicare Other

## 2023-04-05 DIAGNOSIS — I639 Cerebral infarction, unspecified: Secondary | ICD-10-CM | POA: Diagnosis not present

## 2023-04-06 ENCOUNTER — Ambulatory Visit
Admission: RE | Admit: 2023-04-06 | Discharge: 2023-04-06 | Disposition: A | Discharge status: Home / Self Care | Payer: Medicare Other | Source: Ambulatory Visit | Attending: Internal Medicine | Admitting: Internal Medicine

## 2023-04-06 DIAGNOSIS — Z1231 Encounter for screening mammogram for malignant neoplasm of breast: Secondary | ICD-10-CM | POA: Diagnosis not present

## 2023-04-08 ENCOUNTER — Ambulatory Visit: Payer: Medicare Other | Admitting: Internal Medicine

## 2023-04-08 ENCOUNTER — Encounter: Payer: Self-pay | Admitting: Internal Medicine

## 2023-04-08 VITALS — BP 118/66 | HR 66 | Ht 65.0 in | Wt 211.0 lb

## 2023-04-08 DIAGNOSIS — E785 Hyperlipidemia, unspecified: Secondary | ICD-10-CM

## 2023-04-08 DIAGNOSIS — Z780 Asymptomatic menopausal state: Secondary | ICD-10-CM

## 2023-04-08 DIAGNOSIS — M8580 Other specified disorders of bone density and structure, unspecified site: Secondary | ICD-10-CM | POA: Insufficient documentation

## 2023-04-08 DIAGNOSIS — K760 Fatty (change of) liver, not elsewhere classified: Secondary | ICD-10-CM

## 2023-04-08 DIAGNOSIS — D6869 Other thrombophilia: Secondary | ICD-10-CM

## 2023-04-08 DIAGNOSIS — R7303 Prediabetes: Secondary | ICD-10-CM

## 2023-04-08 DIAGNOSIS — I480 Paroxysmal atrial fibrillation: Secondary | ICD-10-CM

## 2023-04-08 DIAGNOSIS — I10 Essential (primary) hypertension: Secondary | ICD-10-CM | POA: Diagnosis not present

## 2023-04-08 DIAGNOSIS — Z0000 Encounter for general adult medical examination without abnormal findings: Secondary | ICD-10-CM

## 2023-04-08 LAB — CBC WITH DIFFERENTIAL/PLATELET
Basophils Absolute: 0 10*3/uL (ref 0.0–0.1)
Basophils Relative: 0.2 % (ref 0.0–3.0)
Eosinophils Absolute: 0.1 10*3/uL (ref 0.0–0.7)
Eosinophils Relative: 2 % (ref 0.0–5.0)
HCT: 40.8 % (ref 36.0–46.0)
Hemoglobin: 13.6 g/dL (ref 12.0–15.0)
Lymphocytes Relative: 42 % (ref 12.0–46.0)
Lymphs Abs: 1.7 10*3/uL (ref 0.7–4.0)
MCHC: 33.2 g/dL (ref 30.0–36.0)
MCV: 97.2 fL (ref 78.0–100.0)
Monocytes Absolute: 0.3 10*3/uL (ref 0.1–1.0)
Monocytes Relative: 6.9 % (ref 3.0–12.0)
Neutro Abs: 2 10*3/uL (ref 1.4–7.7)
Neutrophils Relative %: 48.9 % (ref 43.0–77.0)
Platelets: 230 10*3/uL (ref 150.0–400.0)
RBC: 4.2 Mil/uL (ref 3.87–5.11)
RDW: 13 % (ref 11.5–15.5)
WBC: 4.1 10*3/uL (ref 4.0–10.5)

## 2023-04-08 LAB — COMPREHENSIVE METABOLIC PANEL
ALT: 20 U/L (ref 0–35)
AST: 19 U/L (ref 0–37)
Albumin: 4.6 g/dL (ref 3.5–5.2)
Alkaline Phosphatase: 53 U/L (ref 39–117)
BUN: 18 mg/dL (ref 6–23)
CO2: 30 meq/L (ref 19–32)
Calcium: 9.7 mg/dL (ref 8.4–10.5)
Chloride: 103 meq/L (ref 96–112)
Creatinine, Ser: 0.78 mg/dL (ref 0.40–1.20)
GFR: 76.63 mL/min (ref >60.00)
Glucose, Bld: 100 mg/dL — ABNORMAL HIGH (ref 70–99)
Potassium: 3.6 meq/L (ref 3.5–5.1)
Sodium: 140 meq/L (ref 135–145)
Total Bilirubin: 0.6 mg/dL (ref 0.2–1.2)
Total Protein: 7.1 g/dL (ref 6.0–8.3)

## 2023-04-08 LAB — LIPID PANEL
Cholesterol: 174 mg/dL (ref 0–200)
HDL: 51.1 mg/dL (ref >39.00)
LDL Cholesterol: 98 mg/dL (ref 0–99)
NonHDL: 122.83
Total CHOL/HDL Ratio: 3
Triglycerides: 123 mg/dL (ref 0.0–149.0)
VLDL: 24.6 mg/dL (ref 0.0–40.0)

## 2023-04-08 LAB — MICROALBUMIN / CREATININE URINE RATIO
Creatinine,U: 104 mg/dL
Microalb Creat Ratio: 0.7 mg/g (ref 0.0–30.0)
Microalb, Ur: <0.7 mg/dL (ref 0.0–1.9)

## 2023-04-08 LAB — TSH: TSH: 1.29 u[IU]/mL (ref 0.35–5.50)

## 2023-04-08 LAB — LDL CHOLESTEROL, DIRECT: Direct LDL: 100 mg/dL

## 2023-04-08 LAB — HEMOGLOBIN A1C: Hgb A1c MFr Bld: 6 % (ref 4.6–6.5)

## 2023-04-08 MED ORDER — TELMISARTAN 80 MG PO TABS: ORAL_TABLET | ORAL | 1 refills | Status: DC

## 2023-04-08 MED ORDER — ROSUVASTATIN CALCIUM 20 MG PO TABS: 20.0000 mg | ORAL_TABLET | Freq: Every day | ORAL | 2 refills | Status: DC

## 2023-04-08 NOTE — Assessment & Plan Note (Signed)
Well controlled on current regimen of telmisartan hctz . Renal function stable, no changes today.

## 2023-04-08 NOTE — Assessment & Plan Note (Signed)
With history of embolic CVA/  She is wearing an implantable loop recorder which has recorded several episodes .  Continue Eliquis

## 2023-04-08 NOTE — Assessment & Plan Note (Addendum)
Managed with crestor 20 mg daily. LDL is not at goal.  She did not tolerate higher doses.  Will recommend adding zetia   Lab Results  Component Value Date   CHOL 174 04/08/2023   HDL 51.10 04/08/2023   LDLCALC 98 04/08/2023   LDLDIRECT 100.0 04/08/2023   TRIG 123.0 04/08/2023   CHOLHDL 3 04/08/2023

## 2023-04-08 NOTE — Patient Instructions (Signed)
Preventing progression to diabetes can be achieved with low glycemic index diet, weight loss and exercise  Health[employer] Choice "low carb power bowl"  entrees and  "Steamer" entrees are are great low carb entrees that microwave in 5 minutes  I recommend substituting them for one meal each day to lower your caloric and carbohydrate intake  30 minutes of exercise DAILY is also KEY.   WE WILL REPEAT YOUR BONE DENSITY TEST IN 2026  CONSIDER REQUESTING A SLEEP STUDY TO RULE OUT SLEEP APNEA AS A CAUSE FOR THE ATRIAL FIB EPISODES

## 2023-04-08 NOTE — Assessment & Plan Note (Signed)
Complicated by hypertension,  Fatty liver and prediabetes.  I have addressed  BMI and recommended a low glycemic index diet utilizing smaller more frequent meals to increase metabolism.  I have also recommended that patient start exercising with a goal of 30 minutes of aerobic exercise a minimum of 5 days per week. Repeat A1c ordered   Lab Results  Component Value Date   HGBA1C 5.8 01/19/2022

## 2023-04-08 NOTE — Progress Notes (Signed)
Patient ID: Lisa Green, female    DOB: 1950-11-13  Age: 72 y.o. MRN: [id number]  The patient is here for annual preventive examination and management of other chronic and acute problems.   The risk factors are reflected in the social history.  The roster of all physicians providing medical care to patient - is listed in the Snapshot section of the chart.  Activities of daily living:  The patient is 100% independent in all ADLs: dressing, toileting, feeding as well as independent mobility  Home safety : The patient has smoke detectors in the home. They wear seatbelts.  There are no firearms at home. There is no violence in the home.   There is no risks for hepatitis, STDs or HIV. There is no   history of blood transfusion. They have no travel history to infectious disease endemic areas of the world.  The patient has seen their dentist in the last six month. They have seen their eye doctor in the last year. They admit to slight hearing difficulty with regard to whispered voices and some television programs.  They have deferred audiologic testing in the last year.  They do not  have excessive sun exposure. Discussed the need for sun protection: hats, long sleeves and use of sunscreen if there is significant sun exposure.   Diet: the importance of a healthy diet is discussed. They do have a healthy diet.  The benefits of regular aerobic exercise were discussed. She walks 4 times per week ,  20 minutes.   Depression screen: there are no signs or vegative symptoms of depression- irritability, change in appetite, anhedonia, sadness/tearfullness.  Cognitive assessment: the patient manages all their financial and personal affairs and is actively engaged. They could relate day,date,year and events; recalled 2/3 objects at 3 minutes; performed clock-face test normally.  The following portions of the patient's history were reviewed and updated as appropriate: allergies, current medications, past family  history, past medical history,  past surgical history, past social history  and problem list.  Visual acuity was not assessed per patient preference since she has regular follow up with her ophthalmologist. Hearing and body mass index were assessed and reviewed.   During the course of the visit the patient was educated and counseled about appropriate screening and preventive services including : fall prevention , diabetes screening, nutrition counseling, colorectal cancer screening, and recommended immunizations.    CC: The primary encounter diagnosis was Primary hypertension. Diagnoses of Hyperlipidemia LDL goal <70, Prediabetes, Morbid obesity (HCC), Acquired thrombophilia (HCC), Osteopenia after menopause, Encounter for preventive health examination, PAF (paroxysmal atrial fibrillation) (HCC), and Hepatic steatosis were also pertinent to this visit.  History Lisa Green has a past medical history of Diverticulosis (2013), Hyperlipidemia, Obesity (BMI 30.0-34.9), and Stroke (HCC) (11/17/2018).   She has a past surgical history that includes Cesarean section (1981); Cesarean section (1983); Abdominal hysterectomy; Cosmetic surgery; Endovenous ablation saphenous vein w/ laser (2008); LOOP RECORDER INSERTION (N/A, 11/18/2018); TEE without cardioversion (N/A, 11/18/2018); Bubble study (11/18/2018); LOOP RECORDER INSERTION (N/A, 06/17/2022); and LOOP RECORDER REMOVAL (N/A, 06/17/2022).   Her family history includes Cancer (age of onset: 62) in her sister; Coronary artery disease in her paternal grandfather, paternal grandmother, and paternal uncle; Coronary artery disease (age of onset: 14) in her father; Heart attack (age of onset: 107) in her brother; Heart disease in her brother, mother, paternal grandfather, paternal grandmother, and paternal uncle; Stroke in her brother and father.She reports that she has never smoked. She has never used smokeless  tobacco. She reports current alcohol use. She reports that she  does not use drugs.  Outpatient Medications Prior to Visit  Medication Sig Dispense Refill   apixaban (ELIQUIS) 5 MG TABS tablet TAKE 1 TABLET BY MOUTH 2 TIMES DAILY. 180 tablet 3   Cholecalciferol (VITAMIN D-3 PO) Take 1 tablet by mouth every evening.     hydrochlorothiazide (HYDRODIURIL) 25 MG tablet TAKE 1 TABLET(25 MG) BY MOUTH DAILY 90 tablet 3   rosuvastatin (CRESTOR) 20 MG tablet TAKE 1 TABLET(20 MG) BY MOUTH DAILY (Patient taking differently: Take 20 mg by mouth at bedtime.) 90 tablet 1   telmisartan (MICARDIS) 80 MG tablet TAKE 1 TABLET(80 MG) BY MOUTH AT BEDTIME 90 tablet 1   No facility-administered medications prior to visit.    Review of Systems  Patient denies headache, fevers, malaise, unintentional weight loss, skin rash, eye pain, sinus congestion and sinus pain, sore throat, dysphagia,  hemoptysis , cough, dyspnea, wheezing, chest pain, palpitations, orthopnea, edema, abdominal pain, nausea, melena, diarrhea, constipation, flank pain, dysuria, hematuria, urinary  Frequency, nocturia, numbness, tingling, seizures,  Focal weakness, Loss of consciousness,  Tremor, insomnia, depression, anxiety, and suicidal ideation.     Objective:  BP 118/66   Pulse 66   Ht 5\' 5"  (1.651 m)   Wt 211 lb (95.7 kg)   SpO2 98%   BMI 35.11 kg/m   Physical Exam Vitals reviewed.  Constitutional:      General: She is not in acute distress.    Appearance: Normal appearance. She is normal weight. She is not ill-appearing, toxic-appearing or diaphoretic.  HENT:     Head: Normocephalic.  Eyes:     General: No scleral icterus.       Right eye: No discharge.        Left eye: No discharge.     Conjunctiva/sclera: Conjunctivae normal.  Cardiovascular:     Rate and Rhythm: Normal rate and regular rhythm.     Heart sounds: Normal heart sounds.  Pulmonary:     Effort: Pulmonary effort is normal. No respiratory distress.     Breath sounds: Normal breath sounds.  Musculoskeletal:         General: Normal range of motion.  Skin:    General: Skin is warm and dry.  Neurological:     General: No focal deficit present.     Mental Status: She is alert and oriented to person, place, and time. Mental status is at baseline.  Psychiatric:        Mood and Affect: Mood normal.        Behavior: Behavior normal.        Thought Content: Thought content normal.        Judgment: Judgment normal.      Assessment & Plan:  Primary hypertension Assessment & Plan: Well controlled on current regimen of telmisartan hctz . Renal function stable, no changes today.  Orders: -     Comprehensive metabolic panel -     Microalbumin / creatinine urine ratio  Hyperlipidemia LDL goal <70 Assessment & Plan: Managed with crestor 20 mg daily. LDL is not at goal.  She did not tolerate higher doses.  Will recommend adding zetia   Lab Results  Component Value Date   CHOL 174 04/08/2023   HDL 51.10 04/08/2023   LDLCALC 98 04/08/2023   LDLDIRECT 100.0 04/08/2023   TRIG 123.0 04/08/2023   CHOLHDL 3 04/08/2023     Orders: -     Lipid panel -  LDL cholesterol, direct  Prediabetes Assessment & Plan: REC LOW GLYCEMIC INDEX DIET   Orders: -     Comprehensive metabolic panel -     Hemoglobin A1c -     Microalbumin / creatinine urine ratio  Morbid obesity (HCC) Assessment & Plan: Complicated by hypertension,  Fatty liver and prediabetes.  I have addressed  BMI and recommended a low glycemic index diet utilizing smaller more frequent meals to increase metabolism.  I have also recommended that patient start exercising with a goal of 30 minutes of aerobic exercise a minimum of 5 days per week. Repeat A1c ordered   Lab Results  Component Value Date   HGBA1C 5.8 01/19/2022     Orders: -     CBC with Differential/Platelet -     TSH  Acquired thrombophilia (HCC) Assessment & Plan: She has been advised by cardiology to continue  Eliquis for secondary prevention of  embolic stroke risk   due to recurret episodes of paroxysmal atrial fibrillation 3 hours by  recently replaced  implantable loop recorder following  an embolic event;  her CHADS score is 5.  She is taking Eliquis    Osteopenia after menopause Assessment & Plan: T score -1.2 in hip in 2021 ,  normal elsewhere, repeat in 2026    Encounter for preventive health examination Assessment & Plan: age appropriate education and counseling updated, referrals for preventative services and immunizations addressed, dietary and smoking counseling addressed, most recent labs reviewed.  I have personally reviewed and have noted:   1) the patient's medical and social history 2) The pt's use of alcohol, tobacco, and illicit drugs 3) The patient's current medications and supplements 4) Functional ability including ADL's, fall risk, home safety risk, hearing and visual impairment 5) Diet and physical activities 6) Evidence for depression or mood disorder 7) The patient's height, weight, and BMI have been recorded in the chart  I have made referrals, and provided counseling and education based on review of the above    PAF (paroxysmal atrial fibrillation) (HCC) Assessment & Plan: With history of embolic CVA/  She is wearing an implantable loop recorder which has recorded several episodes .  Continue Eliquis    Hepatic steatosis Assessment & Plan: Secondary to obesity and lifestyle with prediabetes also noted.   LFTs  have normalized.  She is taking a statin.   HepA/B vaccination again recommended .  Will recommend trial of metformin vs ozempic   For  A1c  > 6.0  Lab Results  Component Value Date   ALT 20 04/08/2023   AST 19 04/08/2023   ALKPHOS 53 04/08/2023   BILITOT 0.6 04/08/2023      Other orders -     Rosuvastatin Calcium; Take 1 tablet (20 mg total) by mouth at bedtime.  Dispense: 90 tablet; Refill: 2 -     Telmisartan; TAKE 1 TABLET(80 MG) BY MOUTH AT BEDTIME  Dispense: 90 tablet; Refill: 1      I  provided 40 minutes of  face-to-face time during this encounter reviewing patient's current problems and past surgeries,  recent labs and imaging studies, providing counseling on the above mentioned problems , and coordination  of care .   Follow-up: Return in about 6 months (around 10/06/2023) for hypertension.   Sherlene Shams, MD

## 2023-04-08 NOTE — Assessment & Plan Note (Addendum)
She has been advised by cardiology to continue  Eliquis for secondary prevention of  embolic stroke risk  due to recurret episodes of paroxysmal atrial fibrillation 3 hours by  recently replaced  implantable loop recorder following  an embolic event;  her CHADS score is 5.  She is taking Eliquis

## 2023-04-08 NOTE — Assessment & Plan Note (Signed)
Secondary to obesity and lifestyle with prediabetes also noted.   LFTs  have normalized.  She is taking a statin.   HepA/B vaccination again recommended .  Will recommend trial of metformin vs ozempic   For  A1c  > 6.0  Lab Results  Component Value Date   ALT 20 04/08/2023   AST 19 04/08/2023   ALKPHOS 53 04/08/2023   BILITOT 0.6 04/08/2023

## 2023-04-08 NOTE — Assessment & Plan Note (Signed)
T score -1.2 in hip in 2021 ,  normal elsewhere, repeat in 2026

## 2023-04-08 NOTE — Assessment & Plan Note (Signed)

## 2023-04-08 NOTE — Assessment & Plan Note (Signed)
REC LOW GLYCEMIC INDEX DIET

## 2023-04-30 ENCOUNTER — Ambulatory Visit (INDEPENDENT_AMBULATORY_CARE_PROVIDER_SITE_OTHER): Payer: Medicare Other

## 2023-04-30 DIAGNOSIS — I639 Cerebral infarction, unspecified: Secondary | ICD-10-CM

## 2023-05-02 LAB — CUP PACEART REMOTE DEVICE CHECK
Date Time Interrogation Session: 20241128230922
Implantable Pulse Generator Implant Date: 20240117

## 2023-05-03 NOTE — Progress Notes (Signed)
Carelink Summary Report / Loop Recorder 

## 2023-06-04 ENCOUNTER — Ambulatory Visit (INDEPENDENT_AMBULATORY_CARE_PROVIDER_SITE_OTHER): Payer: Medicare Other

## 2023-06-04 DIAGNOSIS — I639 Cerebral infarction, unspecified: Secondary | ICD-10-CM

## 2023-06-04 LAB — CUP PACEART REMOTE DEVICE CHECK
Date Time Interrogation Session: 20250102231012
Implantable Pulse Generator Implant Date: 20240117

## 2023-06-12 NOTE — Progress Notes (Signed)
 Cardiology Office Note  Date:  06/14/2023   ID:  Lisa Green, DOB [birth year], MRN [id number]  PCP:  Marylynn Verneita CROME, MD   Chief Complaint  Patient presents with   12 month follow up     Doing well.     HPI:  Lisa Green is a 73 year old woman with past medical history of mild obesity,  hyperlipidemia,  strong family history of coronary artery disease with father having his first heart attack at age 35 as well as a stroke, brother with an MI coronary artery disease as well as many other relatives,  Paroxysmal atrial fibrillation documented on loop monitor  HTN,  CVA in June 2020 with a Linq  implanted at that time.  who presents for follow-up of her stroke, PAF  Last seen by myself in clinic January 2024 Seen by EP January 2024, loop monitor inserted for atrial fibrillation  Has a loop monitor in place, rare episodes of atrial fibrillation, last noted on download November 2024, typically will last several hours at a time but is asymptomatic  No TIA, CVA symptoms Active, no regular exercise program Weight trending upwards  Lab work reviewed A1c 6.0 LDL 100 on crestor  10 alternating with 20 mg  EKG personally reviewed by myself on todays visit EKG Interpretation Date/Time:  Monday June 14 2023 08:59:48 EST Ventricular Rate:  72 PR Interval:  138 QRS Duration:  84 QT Interval:  370 QTC Calculation: 405 R Axis:   4  Text Interpretation: Normal sinus rhythm Nonspecific T wave abnormality When compared with ECG of 02-Feb-2019 10:24, Nonspecific T wave abnormality, worse in Inferior leads Confirmed by Perla Lye [phone]) on 06/14/2023 9:19:28 AM    PMH:   has a past medical history of Diverticulosis (2013), Hyperlipidemia, Obesity (BMI 30.0-34.9), and Stroke (HCC) (11/17/2018).  PSH:    Past Surgical History:  Procedure Laterality Date   ABDOMINAL HYSTERECTOMY     with wedge resection of right ovary , normal path   BUBBLE STUDY  11/18/2018   Procedure:  BUBBLE STUDY;  Surgeon: Mona Vinie BROCKS, MD;  Location: [facility];  Service: Cardiovascular;;   CESAREAN SECTION  1981   Lost Baby   CESAREAN SECTION  1983   COSMETIC SURGERY     ENDOVENOUS ABLATION SAPHENOUS VEIN W/ LASER  2008   for varicose veins,  [place], [place]   LOOP RECORDER INSERTION N/A 11/18/2018   Procedure: LOOP RECORDER INSERTION;  Surgeon: Kelsie Agent, MD;  Location: [facility];  Service: Cardiovascular;  Laterality: N/A;   LOOP RECORDER INSERTION N/A 06/17/2022   Procedure: LOOP RECORDER INSERTION;  Surgeon: Nancey Eulas BRAVO, MD;  Location: [facility];  Service: Cardiovascular;  Laterality: N/A;   LOOP RECORDER REMOVAL N/A 06/17/2022   Procedure: LOOP RECORDER REMOVAL;  Surgeon: Nancey Eulas BRAVO, MD;  Location: [facility];  Service: Cardiovascular;  Laterality: N/A;   TEE WITHOUT CARDIOVERSION N/A 11/18/2018   Procedure: TRANSESOPHAGEAL ECHOCARDIOGRAM (TEE);  Surgeon: Mona Vinie BROCKS, MD;  Location: [facility];  Service: Cardiovascular;  Laterality: N/A;    Current Outpatient Medications  Medication Sig Dispense Refill   apixaban  (ELIQUIS ) 5 MG TABS tablet TAKE 1 TABLET BY MOUTH 2 TIMES DAILY. 180 tablet 3   Cholecalciferol (VITAMIN D-3 PO) Take 1 tablet by mouth every evening.     hydrochlorothiazide  (HYDRODIURIL ) 25 MG tablet TAKE 1 TABLET(25 MG) BY MOUTH DAILY 90 tablet 3   rosuvastatin  (CRESTOR ) 20 MG tablet Take 1 tablet (20 mg  total) by mouth at bedtime. 90 tablet 2   telmisartan  (MICARDIS ) 80 MG tablet TAKE 1 TABLET(80 MG) BY MOUTH AT BEDTIME 90 tablet 1   No current facility-administered medications for this visit.    Allergies:   Wasp venom protein and Insect extract   Social History:  The patient  reports that she has never smoked. She has never used smokeless tobacco. She reports current alcohol use. She reports that she does not use drugs.   Family History:   family history includes Cancer (age of onset: 65) in her sister;  Coronary artery disease in her paternal grandfather, paternal grandmother, and paternal uncle; Coronary artery disease (age of onset: 85) in her father; Heart attack (age of onset: 13) in her brother; Heart disease in her brother, mother, paternal grandfather, paternal grandmother, and paternal uncle; Stroke in her brother and father.   Review of Systems: Review of Systems  Constitutional: Negative.   HENT: Negative.    Respiratory: Negative.    Cardiovascular: Negative.   Gastrointestinal: Negative.   Musculoskeletal: Negative.   Neurological: Negative.   Psychiatric/Behavioral: Negative.    All other systems reviewed and are negative.  PHYSICAL EXAM: VS:  BP 118/80 (BP Location: Left Arm, Patient Position: Sitting, Cuff Size: Large)   Pulse 72   Ht 5' 5 (1.651 m)   Wt 215 lb (97.5 kg)   SpO2 95%   BMI 35.78 kg/m  , BMI Body mass index is 35.78 kg/m. Constitutional:  oriented to person, place, and time. No distress.  HENT:  Head: Grossly normal Eyes:  no discharge. No scleral icterus.  Neck: No JVD, no carotid bruits  Cardiovascular: Regular rate and rhythm, no murmurs appreciated Pulmonary/Chest: Clear to auscultation bilaterally, no wheezes or rails Abdominal: Soft.  no distension.  no tenderness.  Musculoskeletal: Normal range of motion Neurological:  normal muscle tone. Coordination normal. No atrophy Skin: Skin warm and dry Psychiatric: normal affect, pleasant  Recent Labs: 04/08/2023: ALT 20; BUN 18; Creatinine, Ser 0.78; Hemoglobin 13.6; Platelets 230.0; Potassium 3.6; Sodium 140; TSH 1.29    Lipid Panel Lab Results  Component Value Date   CHOL 174 04/08/2023   HDL 51.10 04/08/2023   LDLCALC 98 04/08/2023   TRIG 123.0 04/08/2023    Wt Readings from Last 3 Encounters:  06/14/23 215 lb (97.5 kg)  04/08/23 211 lb (95.7 kg)  02/12/23 210 lb (95.3 kg)     ASSESSMENT AND PLAN:  Problem List Items Addressed This Visit       Cardiology Problems    Hyperlipidemia LDL goal <70   Hypertension   Relevant Orders   EKG 12-Lead (Completed)   PAF (paroxysmal atrial fibrillation) (HCC) - Primary   Relevant Orders   EKG 12-Lead (Completed)     Other   Prediabetes   Other Visit Diagnoses       Cerebrovascular accident (CVA), unspecified mechanism (HCC)       Relevant Orders   EKG 12-Lead (Completed)       Atrial fibrillation, paroxysmal Review of linq downloads indicates episodes of atrial fibrillation recorded, often lasting 3 hours or more Given prior history of stroke, stay on Eliquis  5 twice daily Not on beta-blocker or calcium  channel blocker given rare episodes of A-fib, asymptomatic.  For increased duration or frequency of episodes, could add beta-blocker  Essential hypertension Blood pressure is well controlled on today's visit. No changes made to the medications.  Hyperlipidemia Strong family history of coronary disease Currently taking Crestor  20 mg daily, previously  was alternating 10 and 20 mg pills Suggested she think about adding Zetia 10 mg daily She is going to have her numbers rechecked in 6 months   Signed, Velinda Lunger, M.D., Ph.D. [facility] [place], [place] [phone]

## 2023-06-14 ENCOUNTER — Encounter: Payer: Self-pay | Admitting: Cardiovascular Disease

## 2023-06-14 ENCOUNTER — Ambulatory Visit: Payer: Medicare Other | Attending: Cardiovascular Disease | Admitting: Cardiovascular Disease

## 2023-06-14 VITALS — BP 118/80 | HR 72 | Ht 65.0 in | Wt 215.0 lb

## 2023-06-14 DIAGNOSIS — I480 Paroxysmal atrial fibrillation: Secondary | ICD-10-CM | POA: Diagnosis not present

## 2023-06-14 DIAGNOSIS — I639 Cerebral infarction, unspecified: Secondary | ICD-10-CM | POA: Diagnosis not present

## 2023-06-14 DIAGNOSIS — I10 Essential (primary) hypertension: Secondary | ICD-10-CM

## 2023-06-14 DIAGNOSIS — R7303 Prediabetes: Secondary | ICD-10-CM | POA: Diagnosis not present

## 2023-06-14 DIAGNOSIS — E785 Hyperlipidemia, unspecified: Secondary | ICD-10-CM | POA: Diagnosis not present

## 2023-06-14 NOTE — Patient Instructions (Signed)

## 2023-07-09 ENCOUNTER — Ambulatory Visit (INDEPENDENT_AMBULATORY_CARE_PROVIDER_SITE_OTHER): Payer: Medicare Other

## 2023-07-09 DIAGNOSIS — I639 Cerebral infarction, unspecified: Secondary | ICD-10-CM | POA: Diagnosis not present

## 2023-07-12 LAB — CUP PACEART REMOTE DEVICE CHECK
Date Time Interrogation Session: 20250206230953
Implantable Pulse Generator Implant Date: 20240117

## 2023-07-13 NOTE — Progress Notes (Signed)
[facility] Summary Report / Loop Recorder

## 2023-07-22 ENCOUNTER — Encounter: Payer: Self-pay | Admitting: Cardiovascular Disease

## 2023-08-10 NOTE — Progress Notes (Signed)
 [facility] Summary Report / Loop Recorder

## 2023-08-10 NOTE — Addendum Note (Signed)
 Addended by: Elease Etienne A on: 08/10/2023 09:44 AM   Modules accepted: Orders

## 2023-08-13 ENCOUNTER — Ambulatory Visit: Payer: Medicare Other

## 2023-08-13 ENCOUNTER — Other Ambulatory Visit: Payer: Self-pay | Admitting: Cardiovascular Disease

## 2023-08-13 DIAGNOSIS — I639 Cerebral infarction, unspecified: Secondary | ICD-10-CM

## 2023-08-13 DIAGNOSIS — E785 Hyperlipidemia, unspecified: Secondary | ICD-10-CM

## 2023-08-13 NOTE — Telephone Encounter (Signed)
 Prescription refill request for Eliquis received. Indication:afib Last office visit:1/25 Scr:0.78  11/24 Age: 73 Weight:97.5  kg  Prescription refilled

## 2023-08-16 LAB — CUP PACEART REMOTE DEVICE CHECK
Date Time Interrogation Session: 20250314063718
Implantable Pulse Generator Implant Date: 20240117

## 2023-08-23 ENCOUNTER — Encounter: Payer: Self-pay | Admitting: Cardiovascular Disease

## 2023-08-31 DIAGNOSIS — K080 Exfoliation of teeth due to systemic causes: Secondary | ICD-10-CM | POA: Diagnosis not present

## 2023-09-17 ENCOUNTER — Ambulatory Visit (INDEPENDENT_AMBULATORY_CARE_PROVIDER_SITE_OTHER): Payer: Medicare Other

## 2023-09-17 DIAGNOSIS — I639 Cerebral infarction, unspecified: Secondary | ICD-10-CM

## 2023-09-18 LAB — CUP PACEART REMOTE DEVICE CHECK
Date Time Interrogation Session: 20250417231030
Implantable Pulse Generator Implant Date: 20240117

## 2023-09-23 NOTE — Progress Notes (Signed)
 [facility] Summary Report / Loop Recorder

## 2023-09-23 NOTE — Addendum Note (Signed)
 Addended by: Lott Rouleau A on: 09/23/2023 12:55 PM   Modules accepted: Orders

## 2023-09-30 ENCOUNTER — Encounter: Payer: Self-pay | Admitting: Cardiovascular Disease

## 2023-10-06 ENCOUNTER — Ambulatory Visit: Payer: Medicare Other | Admitting: Internal Medicine

## 2023-10-22 ENCOUNTER — Ambulatory Visit (INDEPENDENT_AMBULATORY_CARE_PROVIDER_SITE_OTHER): Payer: Medicare Other

## 2023-10-22 DIAGNOSIS — I639 Cerebral infarction, unspecified: Secondary | ICD-10-CM | POA: Diagnosis not present

## 2023-10-22 LAB — CUP PACEART REMOTE DEVICE CHECK
Date Time Interrogation Session: 20250522232429
Implantable Pulse Generator Implant Date: 20240117

## 2023-10-27 NOTE — Addendum Note (Signed)
 Addended by: Lott Rouleau A on: 10/27/2023 09:24 AM   Modules accepted: Orders

## 2023-10-27 NOTE — Progress Notes (Signed)
 [facility] Summary Report / Loop Recorder

## 2023-11-03 ENCOUNTER — Ambulatory Visit: Payer: Self-pay | Admitting: Cardiovascular Disease

## 2023-11-11 ENCOUNTER — Other Ambulatory Visit: Payer: Self-pay | Admitting: Internal Medicine

## 2023-11-15 ENCOUNTER — Other Ambulatory Visit: Payer: Self-pay

## 2023-11-15 MED ORDER — TELMISARTAN 80 MG PO TABS: ORAL_TABLET | ORAL | 0 refills | Status: DC

## 2023-11-15 NOTE — Telephone Encounter (Signed)
 Duplicate request. Is it okay to refuse refill request?

## 2023-11-17 ENCOUNTER — Other Ambulatory Visit: Payer: Self-pay

## 2023-11-17 MED ORDER — HYDROCHLOROTHIAZIDE 25 MG PO TABS: ORAL_TABLET | ORAL | 0 refills | Status: DC

## 2023-11-22 ENCOUNTER — Ambulatory Visit (INDEPENDENT_AMBULATORY_CARE_PROVIDER_SITE_OTHER)

## 2023-11-22 DIAGNOSIS — I639 Cerebral infarction, unspecified: Secondary | ICD-10-CM | POA: Diagnosis not present

## 2023-11-22 LAB — CUP PACEART REMOTE DEVICE CHECK
Date Time Interrogation Session: 20250622233837
Implantable Pulse Generator Implant Date: 20240117

## 2023-11-25 ENCOUNTER — Ambulatory Visit: Payer: Self-pay | Admitting: Cardiovascular Disease

## 2023-11-29 NOTE — Progress Notes (Signed)
 [facility] Summary Report / Loop Recorder

## 2023-12-01 ENCOUNTER — Telehealth (INDEPENDENT_AMBULATORY_CARE_PROVIDER_SITE_OTHER): Admitting: Internal Medicine

## 2023-12-01 ENCOUNTER — Other Ambulatory Visit: Payer: Self-pay | Admitting: Internal Medicine

## 2023-12-01 ENCOUNTER — Encounter: Payer: Self-pay | Admitting: Internal Medicine

## 2023-12-01 VITALS — BP 122/79 | HR 74 | Ht 65.0 in | Wt 215.0 lb

## 2023-12-01 DIAGNOSIS — I6930 Unspecified sequelae of cerebral infarction: Secondary | ICD-10-CM | POA: Diagnosis not present

## 2023-12-01 DIAGNOSIS — K760 Fatty (change of) liver, not elsewhere classified: Secondary | ICD-10-CM

## 2023-12-01 DIAGNOSIS — I480 Paroxysmal atrial fibrillation: Secondary | ICD-10-CM | POA: Diagnosis not present

## 2023-12-01 DIAGNOSIS — I10 Essential (primary) hypertension: Secondary | ICD-10-CM | POA: Diagnosis not present

## 2023-12-01 DIAGNOSIS — E785 Hyperlipidemia, unspecified: Secondary | ICD-10-CM | POA: Diagnosis not present

## 2023-12-01 DIAGNOSIS — D6869 Other thrombophilia: Secondary | ICD-10-CM

## 2023-12-01 MED ORDER — ROSUVASTATIN CALCIUM 20 MG PO TABS: 20.0000 mg | ORAL_TABLET | Freq: Every day | ORAL | 1 refills | Status: DC

## 2023-12-01 MED ORDER — TELMISARTAN 80 MG PO TABS: ORAL_TABLET | ORAL | 1 refills | Status: DC

## 2023-12-01 MED ORDER — APIXABAN 5 MG PO TABS: 5.0000 mg | ORAL_TABLET | Freq: Two times a day (BID) | ORAL | 1 refills | Status: AC

## 2023-12-01 MED ORDER — HYDROCHLOROTHIAZIDE 25 MG PO TABS: ORAL_TABLET | ORAL | 1 refills | Status: DC

## 2023-12-01 NOTE — Progress Notes (Signed)
 Virtual Visit via Caregility   Note   This format is felt to be most appropriate for this patient at this time.  All issues noted in this document were discussed and addressed.  No physical exam was performed (except for noted visual exam findings with Video Visits).   I connected with  Lisa Green on 12/01/23 at  5:00 PM EDT by a video enabled telemedicine application  and verified that I am speaking with the correct person using two identifiers. Location patient: home Location provider: work or home office Persons participating in the virtual visit: patient, provider  I discussed the limitations, risks, security and privacy concerns of performing an evaluation and management service by telephone and the availability of in person appointments. I also discussed with the patient that there may be a patient responsible charge related to this service. The patient expressed understanding and agreed to proceed.   Reason for visit: follow up on hypertension , hyperlipidemia   HPI:  Lisa Green is a 73 yr old female with a history of hypertension, hyperlipidemia , history of right brain stroke involving MCA territory  presents for 6 month follow up .   Hypertension: patient checks blood pressure  weekly at home.  Readings have been for <130/80 at rest  over 90% of the time.   Patient is following a reduced salt diet most days and is taking  telmisartan  and hydrochlorothiazide   as  .  She is overdue to surveillance labs.    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  She has residual numbness of the left hand      ROS: See pertinent positives and negatives per HPI.  Past Medical History:  Diagnosis Date   Allergy    Diverticulosis 2013   colonoscopy, [place]   Hyperlipidemia    Hypertension 11/16/2018   Obesity (BMI 30.0-34.9)    Stroke (HCC) 11/17/2018    R MCA cortical, cryptogenic    Past Surgical History:  Procedure Laterality Date   ABDOMINAL HYSTERECTOMY     with wedge resection of right ovary , normal path   APPENDECTOMY  1978   BUBBLE STUDY  11/18/2018   Procedure: BUBBLE STUDY;  Surgeon: Mona Vinie BROCKS, MD;  Location: [facility] ENDOSCOPY;  Service: Cardiovascular;;   CESAREAN SECTION  06/02/1979   Lost Baby   CESAREAN SECTION  06/01/1981   COSMETIC SURGERY     ENDOVENOUS ABLATION SAPHENOUS VEIN W/ LASER  06/01/2006   for varicose veins,  [place], [place]   LOOP RECORDER INSERTION N/A 11/18/2018   Procedure: LOOP RECORDER INSERTION;  Surgeon: Kelsie Agent, MD;  Location: [facility] INVASIVE CV LAB;  Service: Cardiovascular;  Laterality: N/A;   LOOP RECORDER INSERTION N/A 06/17/2022   Procedure: LOOP RECORDER INSERTION;  Surgeon: Nancey Eulas BRAVO, MD;  Location: [facility] INVASIVE CV LAB;  Service: Cardiovascular;  Laterality: N/A;   LOOP RECORDER REMOVAL N/A 06/17/2022   Procedure: LOOP RECORDER REMOVAL;  Surgeon: Nancey Eulas BRAVO, MD;  Location: [facility] INVASIVE CV LAB;  Service: Cardiovascular;  Laterality: N/A;   TEE WITHOUT CARDIOVERSION N/A 11/18/2018   Procedure: TRANSESOPHAGEAL ECHOCARDIOGRAM (TEE);  Surgeon: Mona Vinie BROCKS, MD;  Location: [facility] ENDOSCOPY;  Service: Cardiovascular;  Laterality: N/A;   TUBAL LIGATION  1983    Family History  Problem Relation Age of Onset   Heart disease Mother    Heart attack Mother  Coronary artery disease Father 24   Stroke Father    Heart attack Father    Heart disease Father    Cancer Sister 15       ovarian ca   Heart attack Brother 94   Heart disease Brother    Stroke Brother    Hypertension Brother    Coronary artery disease Paternal Uncle    Heart disease Paternal Uncle    Coronary artery disease Paternal Grandmother    Heart disease Paternal Grandmother    Coronary artery disease Paternal Grandfather    Heart disease Paternal Grandfather    Heart attack Paternal Grandfather    Heart  attack Maternal Grandmother    Breast cancer Neg Hx     SOCIAL HX:  reports that she has never smoked. She has never used smokeless tobacco. She reports current alcohol use. She reports that she does not use drugs.    Current Outpatient Medications:    Cholecalciferol (VITAMIN D-3 PO), Take 1 tablet by mouth every evening., Disp: , Rfl:    apixaban  (ELIQUIS ) 5 MG TABS tablet, Take 1 tablet (5 mg total) by mouth 2 (two) times daily., Disp: 180 tablet, Rfl: 1   hydrochlorothiazide  (HYDRODIURIL ) 25 MG tablet, TAKE 1 TABLET(25 MG) BY MOUTH DAILY, Disp: 90 tablet, Rfl: 1   rosuvastatin  (CRESTOR ) 20 MG tablet, Take 1 tablet (20 mg total) by mouth at bedtime., Disp: 90 tablet, Rfl: 1   telmisartan  (MICARDIS ) 80 MG tablet, TAKE 1 TABLET(80 MG) BY MOUTH AT BEDTIME, Disp: 90 tablet, Rfl: 1  EXAM:  VITALS per patient if applicable:  GENERAL: alert, oriented, appears well and in no acute distress  HEENT: atraumatic, conjunttiva clear, no obvious abnormalities on inspection of external nose and ears  NECK: normal movements of the head and neck  LUNGS: on inspection no signs of respiratory distress, breathing rate appears normal, no obvious gross SOB, gasping or wheezing  CV: no obvious cyanosis  MS: moves all visible extremities without noticeable abnormality  PSYCH/NEURO: pleasant and cooperative, no obvious depression or anxiety, speech and thought processing grossly intact  ASSESSMENT AND PLAN: Sequelae of cerebral infarction Assessment & Plan: SHE CONTINUES TO HAVE Numbness of left hand without pain or loss of function.  Can feel heat and cold    Hyperlipidemia LDL goal <70 Assessment & Plan: Managed with crestor  20 mg daily. LDL is not at goal., but she did not tolerate higher doses.  She has declined adding zetia   Lab Results  Component Value Date   CHOL 174 04/08/2023   HDL 51.10 04/08/2023   LDLCALC 98 04/08/2023   LDLDIRECT 100.0 04/08/2023   TRIG 123.0 04/08/2023    CHOLHDL 3 04/08/2023     Orders: -     Apixaban ; Take 1 tablet (5 mg total) by mouth 2 (two) times daily.  Dispense: 180 tablet; Refill: 1  Primary hypertension Assessment & Plan: Well controlled on current regimen of telmisartan   and hctz . Renal function is due, no changes today.  Lab Results  Component Value Date   CREATININE 0.78 04/08/2023      PAF (paroxysmal atrial fibrillation) (HCC) Assessment & Plan: With history of embolic CVA/  She is wearing an implantable loop recorder which has recorded several episodes .  Continue Eliquis     Acquired thrombophilia (HCC) Assessment & Plan: She has been advised by cardiology to continue  Eliquis  for secondary prevention of  embolic stroke risk  due to recurret episodes of paroxysmal atrial fibrillation 3 hours  by  recently replaced  implantable loop recorder following  an embolic event;  her CHADS score is 5.  She is taking Eliquis  . Annual CBC needed    Other orders -     hydroCHLOROthiazide ; TAKE 1 TABLET(25 MG) BY MOUTH DAILY  Dispense: 90 tablet; Refill: 1 -     Rosuvastatin  Calcium ; Take 1 tablet (20 mg total) by mouth at bedtime.  Dispense: 90 tablet; Refill: 1 -     Telmisartan ; TAKE 1 TABLET(80 MG) BY MOUTH AT BEDTIME  Dispense: 90 tablet; Refill: 1      I discussed the assessment and treatment plan with the patient. The patient was provided an opportunity to ask questions and all were answered. The patient agreed with the plan and demonstrated an understanding of the instructions.   The patient was advised to call back or seek an in-person evaluation if the symptoms worsen or if the condition fails to improve as anticipated.   I spent 30 minutes dedicated to the care of this patient on the date of this virtual encounter to include pre-visit review of patient's medical history,  including cardiology follow up,  prior imaging studies and labs, face-to-face time with the patient , and post visit ordering of testing and  therapeutics.    Verneita LITTIE Kettering, MD

## 2023-12-01 NOTE — Assessment & Plan Note (Signed)
 SHE CONTINUES TO HAVE Numbness of left hand without pain or loss of function.  Can feel heat and cold

## 2023-12-04 NOTE — Assessment & Plan Note (Signed)
 Well controlled on current regimen of telmisartan   and hctz . Renal function is due, no changes today.  Lab Results  Component Value Date   CREATININE 0.78 04/08/2023

## 2023-12-04 NOTE — Assessment & Plan Note (Signed)
 With history of embolic CVA/  She is wearing an implantable loop recorder which has recorded several episodes .  Continue Eliquis

## 2023-12-04 NOTE — Assessment & Plan Note (Signed)
 Managed with crestor  20 mg daily. LDL is not at goal., but she did not tolerate higher doses.  She has declined adding zetia   Lab Results  Component Value Date   CHOL 174 04/08/2023   HDL 51.10 04/08/2023   LDLCALC 98 04/08/2023   LDLDIRECT 100.0 04/08/2023   TRIG 123.0 04/08/2023   CHOLHDL 3 04/08/2023

## 2023-12-04 NOTE — Assessment & Plan Note (Signed)
 She has been advised by cardiology to continue  Eliquis  for secondary prevention of  embolic stroke risk  due to recurret episodes of paroxysmal atrial fibrillation 3 hours by  recently replaced  implantable loop recorder following  an embolic event;  her CHADS score is 5.  She is taking Eliquis  . Annual CBC needed

## 2023-12-16 ENCOUNTER — Other Ambulatory Visit (INDEPENDENT_AMBULATORY_CARE_PROVIDER_SITE_OTHER)

## 2023-12-16 DIAGNOSIS — I10 Essential (primary) hypertension: Secondary | ICD-10-CM

## 2023-12-16 DIAGNOSIS — K760 Fatty (change of) liver, not elsewhere classified: Secondary | ICD-10-CM

## 2023-12-16 LAB — COMPREHENSIVE METABOLIC PANEL WITH GFR
ALT: 25 U/L (ref 0–35)
AST: 23 U/L (ref 0–37)
Albumin: 4.6 g/dL (ref 3.5–5.2)
Alkaline Phosphatase: 61 U/L (ref 39–117)
BUN: 17 mg/dL (ref 6–23)
CO2: 32 meq/L (ref 19–32)
Calcium: 10.4 mg/dL (ref 8.4–10.5)
Chloride: 99 meq/L (ref 96–112)
Creatinine, Ser: 0.76 mg/dL (ref 0.40–1.20)
GFR: 78.68 mL/min (ref >60.00)
Glucose, Bld: 112 mg/dL — ABNORMAL HIGH (ref 70–99)
Potassium: 4.6 meq/L (ref 3.5–5.1)
Sodium: 138 meq/L (ref 135–145)
Total Bilirubin: 0.7 mg/dL (ref 0.2–1.2)
Total Protein: 7.2 g/dL (ref 6.0–8.3)

## 2023-12-19 ENCOUNTER — Ambulatory Visit: Payer: Self-pay | Admitting: Internal Medicine

## 2023-12-23 ENCOUNTER — Ambulatory Visit

## 2023-12-23 DIAGNOSIS — I639 Cerebral infarction, unspecified: Secondary | ICD-10-CM | POA: Diagnosis not present

## 2023-12-23 LAB — CUP PACEART REMOTE DEVICE CHECK
Date Time Interrogation Session: 20250723233605
Implantable Pulse Generator Implant Date: 20240117

## 2023-12-26 ENCOUNTER — Ambulatory Visit: Payer: Self-pay | Admitting: Cardiovascular Disease

## 2023-12-29 NOTE — Progress Notes (Signed)
 [facility] Summary Report / Loop Recorder

## 2024-01-14 DIAGNOSIS — H2513 Age-related nuclear cataract, bilateral: Secondary | ICD-10-CM | POA: Diagnosis not present

## 2024-01-14 DIAGNOSIS — H35372 Puckering of macula, left eye: Secondary | ICD-10-CM | POA: Diagnosis not present

## 2024-01-24 ENCOUNTER — Ambulatory Visit

## 2024-01-24 DIAGNOSIS — I639 Cerebral infarction, unspecified: Secondary | ICD-10-CM | POA: Diagnosis not present

## 2024-01-25 LAB — CUP PACEART REMOTE DEVICE CHECK
Date Time Interrogation Session: 20250823232013
Implantable Pulse Generator Implant Date: 20240117

## 2024-02-03 ENCOUNTER — Ambulatory Visit: Payer: Self-pay | Admitting: Cardiovascular Disease

## 2024-02-09 ENCOUNTER — Encounter: Payer: Self-pay | Admitting: *Deleted

## 2024-02-14 ENCOUNTER — Ambulatory Visit (INDEPENDENT_AMBULATORY_CARE_PROVIDER_SITE_OTHER): Admitting: *Deleted

## 2024-02-14 VITALS — Ht 65.0 in | Wt 216.0 lb

## 2024-02-14 DIAGNOSIS — Z1231 Encounter for screening mammogram for malignant neoplasm of breast: Secondary | ICD-10-CM | POA: Diagnosis not present

## 2024-02-14 DIAGNOSIS — Z0000 Encounter for general adult medical examination without abnormal findings: Secondary | ICD-10-CM

## 2024-02-14 NOTE — Progress Notes (Signed)
 Subjective:   Lisa Green is a 73 y.o. who presents for a Medicare Wellness preventive visit.  As a reminder, Annual Wellness Visits don't include a physical exam, and some assessments may be limited, especially if this visit is performed virtually. We may recommend an in-person follow-up visit with your provider if needed.  Visit Complete: Virtual I connected with  Lisa Green on 02/14/24 by a audio enabled telemedicine application and verified that I am speaking with the correct person using two identifiers.  Patient Location: Home  Provider Location: Other:  work  I discussed the limitations of evaluation and management by telemedicine. The patient expressed understanding and agreed to proceed.  Vital Signs: Because this visit was a virtual/telehealth visit, some criteria may be missing or patient reported. Any vitals not documented were not able to be obtained and vitals that have been documented are patient reported.  VideoDeclined- This patient declined Librarian, academic. Therefore the visit was completed with audio only.  Persons Participating in Visit: Patient.  AWV Questionnaire: Yes: Patient Medicare AWV questionnaire was completed by the patient on 02/14/24; I have confirmed that all information answered by patient is correct and no changes since this date.  Cardiac Risk Factors include: advanced age (>73men, >20 women);dyslipidemia;hypertension;obesity (BMI >30kg/m2)     Objective:    Today's Vitals   02/14/24 1425  Weight: 216 lb (98 kg)  Height: 5' 5 (1.651 m)   Body mass index is 35.94 kg/m.     02/14/2024    2:32 PM 02/12/2023    9:45 AM 06/17/2022   10:45 AM 11/04/2021   10:54 AM 11/16/2018   10:00 PM  Advanced Directives  Does Patient Have a Medical Advance Directive? Yes Yes Yes Yes Yes  Type of Estate agent of [place];Living will Healthcare Power of [place];Living will Healthcare Power of  [place];Living will Healthcare Power of [place];Living will Living will;Healthcare Power of Attorney  Does patient want to make changes to medical advance directive?   No - Patient declined No - Patient declined No - Patient declined   Copy of Healthcare Power of Attorney in Chart? No - copy requested No - copy requested No - copy requested No - copy requested      Data saved with a previous flowsheet row definition    Current Medications (verified) Outpatient Encounter Medications as of 02/14/2024  Medication Sig   apixaban  (ELIQUIS ) 5 MG TABS tablet Take 1 tablet (5 mg total) by mouth 2 (two) times daily.   Cholecalciferol (VITAMIN D-3 PO) Take 1 tablet by mouth every evening.   hydrochlorothiazide  (HYDRODIURIL ) 25 MG tablet TAKE 1 TABLET(25 MG) BY MOUTH DAILY   rosuvastatin  (CRESTOR ) 20 MG tablet Take 1 tablet (20 mg total) by mouth at bedtime.   telmisartan  (MICARDIS ) 80 MG tablet TAKE 1 TABLET(80 MG) BY MOUTH AT BEDTIME   No facility-administered encounter medications on file as of 02/14/2024.    Allergies (verified) Wasp venom protein and Insect extract   History: Past Medical History:  Diagnosis Date   Allergy    Diverticulosis 2013   colonoscopy, [place]   Hyperlipidemia    Hypertension 11/16/2018   Obesity (BMI 30.0-34.9)    Stroke (HCC) 11/17/2018   R MCA cortical, cryptogenic   Past Surgical History:  Procedure Laterality Date   ABDOMINAL HYSTERECTOMY     with wedge resection of right ovary , normal path   APPENDECTOMY  1978   BUBBLE STUDY  11/18/2018   Procedure:  BUBBLE STUDY;  Surgeon: Mona Vinie BROCKS, MD;  Location: [facility] ENDOSCOPY;  Service: Cardiovascular;;   CESAREAN SECTION  06/02/1979   Lost Baby   CESAREAN SECTION  06/01/1981   COSMETIC SURGERY     ENDOVENOUS ABLATION SAPHENOUS VEIN W/ LASER  06/01/2006   for varicose veins,  [place], [place]   LOOP RECORDER INSERTION N/A 11/18/2018   Procedure: LOOP RECORDER INSERTION;  Surgeon: Kelsie Agent, MD;   Location: [facility] INVASIVE CV LAB;  Service: Cardiovascular;  Laterality: N/A;   LOOP RECORDER INSERTION N/A 06/17/2022   Procedure: LOOP RECORDER INSERTION;  Surgeon: Nancey Eulas BRAVO, MD;  Location: [facility] INVASIVE CV LAB;  Service: Cardiovascular;  Laterality: N/A;   LOOP RECORDER REMOVAL N/A 06/17/2022   Procedure: LOOP RECORDER REMOVAL;  Surgeon: Nancey Eulas BRAVO, MD;  Location: [facility] INVASIVE CV LAB;  Service: Cardiovascular;  Laterality: N/A;   TEE WITHOUT CARDIOVERSION N/A 11/18/2018   Procedure: TRANSESOPHAGEAL ECHOCARDIOGRAM (TEE);  Surgeon: Mona Vinie BROCKS, MD;  Location: [facility] ENDOSCOPY;  Service: Cardiovascular;  Laterality: N/A;   TUBAL LIGATION  1983   Family History  Problem Relation Age of Onset   Heart disease Mother    Heart attack Mother    Coronary artery disease Father 37   Stroke Father    Heart attack Father    Heart disease Father    Cancer Sister 41       ovarian ca   Heart attack Brother 77   Heart disease Brother    Stroke Brother    Hypertension Brother    Coronary artery disease Paternal Uncle    Heart disease Paternal Uncle    Coronary artery disease Paternal Grandmother    Heart disease Paternal Grandmother    Coronary artery disease Paternal Grandfather    Heart disease Paternal Grandfather    Heart attack Paternal Grandfather    Heart attack Maternal Grandmother    Breast cancer Neg Hx    Social History   Socioeconomic History   Marital status: Married    Spouse name: Not on file   Number of children: Not on file   Years of education: Not on file   Highest education level: Master's degree (e.g., MA, MS, MEng, MEd, MSW, MBA)  Occupational History   Occupation: Teacher, adult education    Comment: OT at [employer]  Tobacco Use   Smoking status: Never   Smokeless tobacco: Never  Vaping Use   Vaping status: Never Used  Substance and Sexual Activity   Alcohol use: Yes    Comment: 2 oz red wine daily   Drug use: No   Sexual activity: Not on file  Other  Topics Concern   Not on file  Social History Narrative   Retired Engineer, 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   Married, works part time   Social Drivers of Corporate investment banker Strain: Low Risk  (02/14/2024)   Overall Financial Resource Strain (CARDIA)    Difficulty of Paying Living Expenses: Not hard at all  Food Insecurity: No Food Insecurity (02/14/2024)   Hunger Vital Sign    Worried About Running Out of Food in the Last Year: Never true    Ran Out of Food in the Last Year: Never true  Transportation Needs: No Transportation Needs (02/14/2024)   PRAPARE - Administrator, Civil Service (Medical): No    Lack of Transportation (Non-Medical): No  Physical Activity: Insufficiently Active (02/14/2024)   Exercise Vital Sign    Days of Exercise per Week: 3  days    Minutes of Exercise per Session: 30 min  Stress: No Stress Concern Present (02/14/2024)   [employer] of Occupational Health - Occupational Stress Questionnaire    Feeling of Stress: Not at all  Social Connections: Socially Integrated (02/14/2024)   Social Connection and Isolation Panel    Frequency of Communication with Friends and Family: More than three times a week    Frequency of Social Gatherings with Friends and Family: Twice a week    Attends Religious Services: More than 4 times per year    Active Member of [employer] or Organizations: Yes    Attends Engineer, structural: More than 4 times per year    Marital Status: Married    Tobacco Counseling Counseling given: Not Answered    Clinical Intake:  Pre-visit preparation completed: Yes  Pain : No/denies pain     BMI - recorded: 35.94 Nutritional Status: BMI > 30  Obese Nutritional Risks: None Diabetes: No  Lab Results  Component Value Date   HGBA1C 6.0 04/08/2023   HGBA1C 5.8 01/19/2022   HGBA1C 6.0 12/31/2020     How often do you need to have someone help you when you read instructions, pamphlets, or other written materials from  your doctor or pharmacy?: 1 - Never  Interpreter Needed?: No  Information entered by :: R. Mavis Fichera LPN   Activities of Daily Living     02/14/2024    9:31 AM  In your present state of health, do you have any difficulty performing the following activities:  Hearing? 0  Vision? 0  Difficulty concentrating or making decisions? 0  Walking or climbing stairs? 0  Dressing or bathing? 0  Doing errands, shopping? 0  Preparing Food and eating ? N  Using the Toilet? N  In the past six months, have you accidently leaked urine? N  Do you have problems with loss of bowel control? N  Managing your Medications? N  Managing your Finances? N  Housekeeping or managing your Housekeeping? N    Patient Care Team: Marylynn Verneita CROME, MD as PCP - General (Internal Medicine) Mealor, Eulas BRAVO, MD as PCP - Electrophysiology (Cardiology) Marylynn Verneita CROME, MD (Internal Medicine) [place], Teodoro K, MD as Consulting Physician (Gastroenterology)  I have updated your Care Teams any recent Medical Services you may have received from other providers in the past year.     Assessment:   This is a routine wellness examination for Lisa Green.  Hearing/Vision screen Hearing Screening - Comments:: No issues Vision Screening - Comments:: No corrections   Goals Addressed             This Visit's Progress    Patient Stated       Wants to continue to work  and play golf       Depression Screen     02/14/2024    2:29 PM 12/01/2023    5:13 PM 04/08/2023    8:37 AM 02/12/2023    9:41 AM 01/19/2022   10:18 AM 11/04/2021   10:52 AM 12/31/2020    3:28 PM  PHQ 2/9 Scores  PHQ - 2 Score 0 0 0 0 0 0 0  PHQ- 9 Score 0  0 0       Fall Risk     02/14/2024    9:31 AM 12/01/2023    5:13 PM 04/08/2023    8:37 AM 02/12/2023    9:36 AM 01/19/2022   10:18 AM  Fall Risk   Falls in  the past year? 0 0 1 1 0  Number falls in past yr: 0 0 0 0   Injury with Fall? 0 0 1 1   Comment    brusing   Risk for fall due to : No Fall  Risks No Fall Risks History of fall(s) History of fall(s);Impaired balance/gait No Fall Risks  Follow up Falls evaluation completed;Falls prevention discussed Falls evaluation completed Falls evaluation completed Falls prevention discussed;Falls evaluation completed Falls evaluation completed      Data saved with a previous flowsheet row definition    MEDICARE RISK AT HOME:  Medicare Risk at Home Any stairs in or around the home?: (Patient-Rptd) No If so, are there any without handrails?: (Patient-Rptd) No Home free of loose throw rugs in walkways, pet beds, electrical cords, etc?: (Patient-Rptd) Yes Adequate lighting in your home to reduce risk of falls?: (Patient-Rptd) Yes Life alert?: (Patient-Rptd) No Use of a cane, walker or w/c?: (Patient-Rptd) No Grab bars in the bathroom?: (Patient-Rptd) No Shower chair or bench in shower?: (Patient-Rptd) No Elevated toilet seat or a handicapped toilet?: (Patient-Rptd) No  TIMED UP AND GO:  Was the test performed?  No  Cognitive Function: 6CIT completed        02/14/2024    2:33 PM 02/12/2023    9:46 AM  6CIT Screen  What Year? 0 points 0 points  What month? 0 points 0 points  What time? 0 points 0 points  Count back from 20 0 points 0 points  Months in reverse 0 points 0 points  Repeat phrase 0 points 0 points  Total Score 0 points 0 points    Immunizations Immunization History  Administered Date(s) Administered   Influenza Split 02/23/2013, 02/21/2015   Influenza-Unspecified 03/01/2014, 02/15/2017, 02/21/2018, 02/27/2019, 04/01/2020, 02/23/2023   PFIZER(Purple Top)SARS-COV-2 Vaccination 06/23/2019, 07/21/2019, 03/22/2020   Pneumococcal Conjugate-13 04/07/2017   Pneumococcal Polysaccharide-23 05/30/2018   Tdap 06/19/2009, 12/13/2020   Zoster Recombinant(Shingrix) 12/10/2021, 01/26/2022   Zoster, Live 10/30/2012    Screening Tests Health Maintenance  Topic Date Due   Influenza Vaccine  12/31/2023   COVID-19 Vaccine (4 -  2025-26 season) 01/31/2024   Medicare Annual Wellness (AWV)  02/12/2024   Mammogram  04/05/2024   DTaP/Tdap/Td (3 - Td or Tdap) 12/14/2030   Pneumococcal Vaccine: 50+ Years  Completed   DEXA SCAN  Completed   Zoster Vaccines- Shingrix  Completed   HPV VACCINES  Aged Out   Meningococcal B Vaccine  Aged Out   Colonoscopy  Discontinued    Health Maintenance Items Addressed: Mammogram ordered, Discussed the need to update flu vaccine. Patient declines covid vaccine.   Additional Screening:  Vision Screening: Recommended annual ophthalmology exams for early detection of glaucoma and other disorders of the eye. Is the patient up to date with their annual eye exam?  Yes  Who is the provider or what is the name of the office in which the patient attends annual eye exams? [facility]  Dental Screening: Recommended annual dental exams for proper oral hygiene  Community Resource Referral / Chronic Care Management: CRR required this visit?  No   CCM required this visit?  No   Plan:    I have personally reviewed and noted the following in the patient's chart:   Medical and social history Use of alcohol, tobacco or illicit drugs  Current medications and supplements including opioid prescriptions. Patient is not currently taking opioid prescriptions. Functional ability and status Nutritional status Physical activity Advanced directives List of other physicians Hospitalizations,  surgeries, and ER visits in previous 12 months Vitals Screenings to include cognitive, depression, and falls Referrals and appointments  In addition, I have reviewed and discussed with patient certain preventive protocols, quality metrics, and best practice recommendations. A written personalized care plan for preventive services as well as general preventive health recommendations were provided to patient.   Angeline Fredericks, LPN   [id number]   After Visit Summary: (MyChart) Due to this being a telephonic  visit, the after visit summary with patients personalized plan was offered to patient via MyChart   Notes: Nothing significant to report at this time.

## 2024-02-14 NOTE — Patient Instructions (Addendum)
 Ms. Reader,  Thank you for taking the time for your Medicare Wellness Visit. I appreciate your continued commitment to your health goals. Please review the care plan we discussed, and feel free to reach out if I can assist you further.  Medicare recommends these wellness visits once per year to help you and your care team stay ahead of potential health issues. These visits are designed to focus on prevention, allowing your provider to concentrate on managing your acute and chronic conditions during your regular appointments.  Please note that Annual Wellness Visits do not include a physical exam. Some assessments may be limited, especially if the visit was conducted virtually. If needed, we may recommend a separate in-person follow-up with your provider.  Ongoing Care Seeing your primary care provider every 3 to 6 months helps us  monitor your health and provide consistent, personalized care. Remember to get your flu vaccines.  You have an order for:  []   2D Mammogram  [x]   3D Mammogram  []   Bone Density     Please call for appointment:  [facility]  [address] [place] [address] [phone]    Make sure to wear two-piece clothing.  No lotions, powders, or deodorants the day of the appointment. Make sure to bring picture ID and insurance card.  Bring list of medications you are currently taking including any supplements.    Referrals If a referral was made during today's visit and you haven't received any updates within two weeks, please contact the referred provider directly to check on the status.  Recommended Screenings:  Health Maintenance  Topic Date Due   Flu Shot  12/31/2023   COVID-19 Vaccine (4 - 2025-26 season) 01/31/2024   Breast Cancer Screening  04/05/2024   Medicare Annual Wellness Visit  02/13/2025   DTaP/Tdap/Td vaccine (3 - Td or Tdap) 12/14/2030   Pneumococcal Vaccine for age over 46  Completed    DEXA scan (bone density measurement)  Completed   Zoster (Shingles) Vaccine  Completed   HPV Vaccine  Aged Out   Meningitis B Vaccine  Aged Out   Colon Cancer Screening  Discontinued       02/14/2024    2:32 PM  Advanced Directives  Does Patient Have a Medical Advance Directive? Yes  Type of Estate agent of [place];Living will  Copy of Healthcare Power of Attorney in Chart? No - copy requested   Advance Care Planning is important because it: Ensures you receive medical care that aligns with your values, goals, and preferences. Provides guidance to your family and loved ones, reducing the emotional burden of decision-making during critical moments.  Vision: Annual vision screenings are recommended for early detection of glaucoma, cataracts, and diabetic retinopathy. These exams can also reveal signs of chronic conditions such as diabetes and high blood pressure.  Dental: Annual dental screenings help detect early signs of oral cancer, gum disease, and other conditions linked to overall health, including heart disease and diabetes.  Please see the attached documents for additional preventive care recommendations.

## 2024-02-16 NOTE — Progress Notes (Signed)
 Remote Loop Recorder Transmission

## 2024-02-24 ENCOUNTER — Ambulatory Visit

## 2024-02-24 DIAGNOSIS — I639 Cerebral infarction, unspecified: Secondary | ICD-10-CM

## 2024-02-24 LAB — CUP PACEART REMOTE DEVICE CHECK
Date Time Interrogation Session: 20250925072943
Implantable Pulse Generator Implant Date: 20240117

## 2024-02-28 NOTE — Progress Notes (Signed)
 Remote Loop Recorder Transmission

## 2024-03-02 NOTE — Progress Notes (Signed)
 Remote Loop Recorder Transmission

## 2024-03-07 ENCOUNTER — Ambulatory Visit: Payer: Self-pay | Admitting: Cardiovascular Disease

## 2024-03-27 ENCOUNTER — Ambulatory Visit

## 2024-03-27 DIAGNOSIS — I480 Paroxysmal atrial fibrillation: Secondary | ICD-10-CM

## 2024-03-27 LAB — CUP PACEART REMOTE DEVICE CHECK
Date Time Interrogation Session: 20251026232638
Implantable Pulse Generator Implant Date: 20240117

## 2024-03-30 NOTE — Progress Notes (Signed)
 Remote Loop Recorder Transmission

## 2024-04-06 ENCOUNTER — Ambulatory Visit: Payer: Self-pay | Admitting: Cardiovascular Disease

## 2024-04-18 ENCOUNTER — Telehealth: Payer: Self-pay

## 2024-04-18 NOTE — Telephone Encounter (Signed)
 Copied from CRM [id number]. Topic: Appointments - Scheduling Inquiry for Clinic >> Apr 18, 2024 10:17 AM Nessti S wrote: Reason for CRM: pt called to schedule medicare awv but appts wont available until 01/2025. She doesn't want to wait until next year. Call back number [phone]   ----------------------------------------------------------------------- From previous Reason for Contact - Scheduling Inquiry for Clinic: Reason for CRM:    ----------------------------------------------------------------------- From previous Reason for Contact - Scheduling: Patient/patient representative is calling to schedule an appointment. Refer to attachments for appointment information.  I left voicemail for patient asking her to please call us  back.  E2C2 - when patient calls back, please transfer call to our office to check for earlier appointment.

## 2024-04-27 ENCOUNTER — Encounter

## 2024-04-29 ENCOUNTER — Ambulatory Visit: Attending: Cardiovascular Disease

## 2024-04-29 DIAGNOSIS — I480 Paroxysmal atrial fibrillation: Secondary | ICD-10-CM | POA: Diagnosis not present

## 2024-05-01 LAB — CUP PACEART REMOTE DEVICE CHECK
Date Time Interrogation Session: 20251128231827
Implantable Pulse Generator Implant Date: 20240117

## 2024-05-03 NOTE — Progress Notes (Signed)
 Remote Loop Recorder Transmission

## 2024-05-04 ENCOUNTER — Ambulatory Visit: Payer: Self-pay | Admitting: Cardiovascular Disease

## 2024-05-28 ENCOUNTER — Encounter

## 2024-05-30 ENCOUNTER — Ambulatory Visit

## 2024-05-30 DIAGNOSIS — I480 Paroxysmal atrial fibrillation: Secondary | ICD-10-CM

## 2024-05-31 ENCOUNTER — Ambulatory Visit: Payer: Self-pay | Admitting: Cardiovascular Disease

## 2024-05-31 LAB — CUP PACEART REMOTE DEVICE CHECK
Date Time Interrogation Session: 20251229232156
Implantable Pulse Generator Implant Date: 20240117

## 2024-05-31 NOTE — Progress Notes (Signed)
 Remote Loop Recorder Transmission

## 2024-06-02 ENCOUNTER — Ambulatory Visit
Admission: RE | Admit: 2024-06-02 | Discharge: 2024-06-02 | Disposition: A | Discharge status: Home / Self Care | Source: Ambulatory Visit | Attending: Internal Medicine | Admitting: Internal Medicine

## 2024-06-02 DIAGNOSIS — Z1231 Encounter for screening mammogram for malignant neoplasm of breast: Secondary | ICD-10-CM | POA: Diagnosis present

## 2024-06-06 ENCOUNTER — Ambulatory Visit: Payer: Self-pay | Admitting: Internal Medicine

## 2024-06-06 ENCOUNTER — Other Ambulatory Visit: Payer: Self-pay | Admitting: Internal Medicine

## 2024-06-06 DIAGNOSIS — R928 Other abnormal and inconclusive findings on diagnostic imaging of breast: Secondary | ICD-10-CM

## 2024-06-08 ENCOUNTER — Ambulatory Visit
Admission: RE | Admit: 2024-06-08 | Discharge: 2024-06-08 | Disposition: A | Discharge status: Home / Self Care | Source: Ambulatory Visit | Attending: Internal Medicine | Admitting: Internal Medicine

## 2024-06-08 DIAGNOSIS — R928 Other abnormal and inconclusive findings on diagnostic imaging of breast: Secondary | ICD-10-CM | POA: Diagnosis present

## 2024-06-09 ENCOUNTER — Other Ambulatory Visit: Payer: Self-pay | Admitting: Internal Medicine

## 2024-06-09 DIAGNOSIS — R928 Other abnormal and inconclusive findings on diagnostic imaging of breast: Secondary | ICD-10-CM

## 2024-06-09 DIAGNOSIS — R921 Mammographic calcification found on diagnostic imaging of breast: Secondary | ICD-10-CM

## 2024-06-11 NOTE — Progress Notes (Unsigned)
 Cardiology Office Note  Date:  06/13/2024   ID:  Lisa Green, DOB 01/04/1951, MRN [id number]  PCP:  Marylynn Verneita CROME, MD   Chief Complaint  Patient presents with   12 month follow up     Doing well.     HPI:  [name] is a 74 year old woman with past medical history of mild obesity,  hyperlipidemia,  strong family history of coronary artery disease with father having his first heart attack at age 7 as well as a stroke, brother with an MI coronary artery disease as well as many other relatives,  HTN,   CVA in June 2020 with a Linq  implanted at that time.  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, sandblasting  Followed by EP Loop monitor downloads reviewed in clinic today 1 episode of atrial fibrillation on October 2025 download, burden less than 0.5%, Episode lasting 3 hours 15 minutes 1 episode atrial fibrillation on September 2025 lasting 1 hour 46 minutes 1 episode atrial fibrillation on August 2025 lasting 4 minutes 1 episode on July 2025  lasting 7 hours  Lab work reviewed A1c 6.0 LDL 100, Crestor  20,  Declined Zetia at the time  EKG personally reviewed by myself on todays visit EKG Interpretation Date/Time:  Tuesday June 13 2024 08:08:58 EST Ventricular Rate:  72 PR Interval:  144 QRS Duration:  78 QT Interval:  308 QTC Calculation: 337 R Axis:   4  Text Interpretation: Normal sinus rhythm Nonspecific T wave abnormality When compared with ECG of 14-Jun-2023 08:59, QT has shortened Confirmed by Sharae Zappulla ([id number]) on 06/13/2024 8:23:23 AM    PMH:   has a past  medical history of Allergy, Diverticulosis (2013), Hyperlipidemia, Hypertension (11/16/2018), Obesity (BMI 30.0-34.9), and Stroke (HCC) (11/17/2018).  PSH:    Past Surgical History:  Procedure Laterality Date   ABDOMINAL HYSTERECTOMY     with wedge resection of right ovary , normal path   APPENDECTOMY  1978   BUBBLE STUDY  11/18/2018   Procedure: BUBBLE STUDY;  Surgeon: Mona Vinie BROCKS, MD;  Location: [facility] ENDOSCOPY;  Service: Cardiovascular;;   CESAREAN SECTION  06/02/1979   Lost Baby   CESAREAN SECTION  06/01/1981   COSMETIC SURGERY     ENDOVENOUS ABLATION SAPHENOUS VEIN W/ LASER  06/01/2006   for varicose veins,  [place], [place]   LOOP RECORDER INSERTION N/A 11/18/2018   Procedure: LOOP RECORDER INSERTION;  Surgeon: Kelsie Agent, MD;  Location: [facility] INVASIVE CV LAB;  Service: Cardiovascular;  Laterality: N/A;   LOOP RECORDER INSERTION N/A 06/17/2022   Procedure: LOOP RECORDER INSERTION;  Surgeon: Nancey Eulas BRAVO, MD;  Location: [facility] INVASIVE CV LAB;  Service: Cardiovascular;  Laterality: N/A;   LOOP RECORDER REMOVAL N/A 06/17/2022   Procedure: LOOP RECORDER REMOVAL;  Surgeon: Nancey Eulas BRAVO, MD;  Location: [facility] INVASIVE CV LAB;  Service: Cardiovascular;  Laterality: N/A;   TEE WITHOUT CARDIOVERSION N/A 11/18/2018   Procedure: TRANSESOPHAGEAL ECHOCARDIOGRAM (TEE);  Surgeon: Mona Vinie BROCKS, MD;  Location: [facility] ENDOSCOPY;  Service: Cardiovascular;  Laterality: N/A;   TUBAL LIGATION  1983    Current Outpatient Medications  Medication Sig Dispense Refill  apixaban  (ELIQUIS ) 5 MG TABS tablet Take 1 tablet (5 mg total) by mouth 2 (two) times daily. 180 tablet 1   Cholecalciferol (VITAMIN D-3 PO) Take 1 tablet by mouth every evening.     hydrochlorothiazide  (HYDRODIURIL ) 25 MG tablet TAKE 1 TABLET(25 MG) BY MOUTH DAILY 90 tablet 1   rosuvastatin  (CRESTOR ) 20 MG tablet TAKE 1 TABLET(20 MG) BY MOUTH AT BEDTIME 90 tablet 1   telmisartan  (MICARDIS ) 80 MG tablet TAKE 1 TABLET(80 MG) BY MOUTH AT  BEDTIME 90 tablet 1   No current facility-administered medications for this visit.    Allergies:   Wasp venom protein and Insect extract   Social History:  The patient  reports that she has never smoked. She has never used smokeless tobacco. She reports current alcohol use. She reports that she does not use drugs.   Family History:   family history includes Cancer (age of onset: 59) in her sister; Coronary artery disease in her paternal grandfather, paternal grandmother, and paternal uncle; Coronary artery disease (age of onset: 28) in her father; Heart attack in her father, maternal grandmother, mother, and paternal grandfather; Heart attack (age of onset: 27) in her brother; Heart disease in her brother, father, mother, paternal grandfather, paternal grandmother, and paternal uncle; Hypertension in her brother; Stroke in her brother and father.   Review of Systems: Review of Systems  Constitutional: Negative.   HENT: Negative.    Respiratory: Negative.    Cardiovascular: Negative.   Gastrointestinal: Negative.   Musculoskeletal: Negative.   Neurological: Negative.   Psychiatric/Behavioral: Negative.    All other systems reviewed and are negative.  PHYSICAL EXAM: VS:  Ht 5' 5 (1.651 m)   Wt 220 lb (99.8 kg)   BMI 36.61 kg/m  , BMI Body mass index is 36.61 kg/m. Constitutional:  oriented to person, place, and time. No distress.  HENT:  Head: Grossly normal Eyes:  no discharge. No scleral icterus.  Neck: No JVD, no carotid bruits  Cardiovascular: Regular rate and rhythm, no murmurs appreciated Pulmonary/Chest: Clear to auscultation bilaterally, no wheezes or rales Abdominal: Soft.  no distension.  no tenderness.  Musculoskeletal: Normal range of motion Neurological:  normal muscle tone. Coordination normal. No atrophy Skin: Skin warm and dry Psychiatric: normal affect, pleasant   Recent Labs: 12/16/2023: ALT 25; BUN 17; Creatinine, Ser 0.76; Potassium 4.6; Sodium 138     Lipid Panel Lab Results  Component Value Date   CHOL 174 04/08/2023   HDL 51.10 04/08/2023   LDLCALC 98 04/08/2023   TRIG 123.0 04/08/2023    Wt Readings from Last 3 Encounters:  06/13/24 220 lb (99.8 kg)  02/14/24 216 lb (98 kg)  12/01/23 215 lb (97.5 kg)     ASSESSMENT AND PLAN:  Problem List Items Addressed This Visit       Cardiology Problems   Hyperlipidemia LDL goal <70   Hypertension   Relevant Orders   EKG 12-Lead   PAF (paroxysmal atrial fibrillation) (HCC) - Primary   Relevant Orders   EKG 12-Lead     Other   Morbid obesity (HCC)   Prediabetes    Atrial fibrillation, paroxysmal Review of linq downloads detailing episode of atrial fibrillation once a month or every other month, self terminating She reports she is asymptomatic, does not feel that she needs medication for rate/rhythm control Recommend she stay on Eliquis  5 twice daily -For symptomatic episodes or more frequent, longer lasting episodes on downloads, would suggest propranolol as needed She does not use  tracking system such as Apple Watch  Essential hypertension Blood pressure is well controlled on today's visit. No changes made to the medications.  Hyperlipidemia Strong family history of coronary disease Currently taking Crestor  20 mg daily, Previously declined Zetia Details discussed, given strong family history of ordered CT calcium  scoring For elevated calcium  score would encourage adding Zetia  Signed, Velinda Lunger, M.D., Ph.D. [facility] [place], [place] [phone]

## 2024-06-12 ENCOUNTER — Other Ambulatory Visit: Payer: Self-pay | Admitting: Internal Medicine

## 2024-06-13 ENCOUNTER — Encounter: Payer: Self-pay | Admitting: Cardiovascular Disease

## 2024-06-13 ENCOUNTER — Ambulatory Visit: Attending: Cardiovascular Disease | Admitting: Cardiovascular Disease

## 2024-06-13 VITALS — BP 130/70 | HR 72 | Ht 65.0 in | Wt 220.0 lb

## 2024-06-13 DIAGNOSIS — E785 Hyperlipidemia, unspecified: Secondary | ICD-10-CM | POA: Diagnosis not present

## 2024-06-13 DIAGNOSIS — I10 Essential (primary) hypertension: Secondary | ICD-10-CM | POA: Diagnosis not present

## 2024-06-13 DIAGNOSIS — R7303 Prediabetes: Secondary | ICD-10-CM | POA: Diagnosis not present

## 2024-06-13 DIAGNOSIS — I480 Paroxysmal atrial fibrillation: Secondary | ICD-10-CM

## 2024-06-13 NOTE — Patient Instructions (Addendum)
 Medication Instructions:  No changes  If you need a refill on your cardiac medications before your next appointment, please call your pharmacy.   Lab work: No new labs needed  Testing/Procedures: Your physician has recommended that you have CT Coronary Calcium Score.  - $99 out of pocket cost at the time of your test - Call [phone] to schedule at your convenience.  [facility] [address] [place], [place] [address]   Coronary CalciumScan A coronary calcium scan is an imaging test used to look for deposits of calcium and other fatty materials (plaques) in the inner lining of the blood vessels of the heart (coronary arteries). These deposits of calcium and plaques can partly clog and narrow the coronary arteries without producing any symptoms or warning signs. This puts a person at risk for a heart attack. This test can detect these deposits before symptoms develop. Tell a health care provider about: Any allergies you have. All medicines you are taking, including vitamins, herbs, eye drops, creams, and over-the-counter medicines. Any problems you or family members have had with anesthetic medicines. Any blood disorders you have. Any surgeries you have had. Any medical conditions you have. Whether you are pregnant or may be pregnant. What are the risks? Generally, this is a safe procedure. However, problems may occur, including: Harm to a pregnant woman and her unborn baby. This test involves the use of radiation. Radiation exposure can be dangerous to a pregnant woman and her unborn baby. If you are pregnant, you generally should not have this procedure done. Slight increase in the risk of cancer. This is because of the radiation involved in the test. What happens before the procedure? No preparation is needed for this procedure. What happens during the procedure? You will undress and remove any jewelry around your neck or  chest. You will put on a hospital gown. Sticky electrodes will be placed on your chest. The electrodes will be connected to an electrocardiogram (ECG) machine to record a tracing of the electrical activity of your heart. A CT scanner will take pictures of your heart. During this time, you will be asked to lie still and hold your breath for 2-3 seconds while a picture of your heart is being taken. The procedure may vary among health care providers and hospitals. What happens after the procedure? You can get dressed. You can return to your normal activities. It is up to you to get the results of your test. Ask your health care provider, or the department that is doing the test, when your results will be ready. Summary A coronary calcium scan is an imaging test used to look for deposits of calcium and other fatty materials (plaques) in the inner lining of the blood vessels of the heart (coronary arteries). Generally, this is a safe procedure. Tell your health care provider if you are pregnant or may be pregnant. No preparation is needed for this procedure. A CT scanner will take pictures of your heart. You can return to your normal activities after the scan is done. This information is not intended to replace advice given to you by your health care provider. Make sure you discuss any questions you have with your health care provider. Document Released: 11/14/2007 Document Revised: 04/06/2016 Document Reviewed: 04/06/2016 Elsevier Interactive Patient Education  2017 [employer].   Follow-Up: At [facility], you and your health needs are our priority.  As part of our continuing mission to provide you with exceptional heart  care, we have created designated Provider Care Teams.  These Care Teams include your primary Cardiologist (physician) and Advanced Practice Providers (APPs -  Physician Assistants and Nurse Practitioners) who all work together to provide you with the care you need, when you  need it.  You will need a follow up appointment in 12 months  Providers on your designated Care Team:   Lonni Meager, NP Bernardino Bring, PA-C Cadence [place], [place]  COVID-19 Vaccine Information can be found at: [email or web address] For questions related to vaccine distribution or appointments, please email [email or web address] or call [phone].

## 2024-06-14 ENCOUNTER — Ambulatory Visit
Admission: RE | Admit: 2024-06-14 | Discharge: 2024-06-14 | Disposition: A | Discharge status: Home / Self Care | Payer: Self-pay | Source: Ambulatory Visit | Attending: Cardiovascular Disease | Admitting: Cardiovascular Disease

## 2024-06-14 DIAGNOSIS — E785 Hyperlipidemia, unspecified: Secondary | ICD-10-CM | POA: Insufficient documentation

## 2024-06-16 ENCOUNTER — Encounter: Payer: Self-pay | Admitting: Emergency Medicine

## 2024-06-16 ENCOUNTER — Ambulatory Visit: Payer: Self-pay | Admitting: Cardiovascular Disease

## 2024-06-16 ENCOUNTER — Other Ambulatory Visit: Payer: Self-pay

## 2024-06-16 MED ORDER — HYDROCHLOROTHIAZIDE 25 MG PO TABS: ORAL_TABLET | ORAL | 0 refills | Status: AC

## 2024-06-16 MED ORDER — TELMISARTAN 80 MG PO TABS: ORAL_TABLET | ORAL | 0 refills | Status: AC

## 2024-06-16 NOTE — Telephone Encounter (Signed)
 Last OV 12/01/23 Next OV 07/31/24

## 2024-06-28 ENCOUNTER — Encounter

## 2024-06-30 ENCOUNTER — Ambulatory Visit: Attending: Cardiovascular Disease

## 2024-06-30 DIAGNOSIS — I480 Paroxysmal atrial fibrillation: Secondary | ICD-10-CM | POA: Diagnosis not present

## 2024-06-30 LAB — CUP PACEART REMOTE DEVICE CHECK
Date Time Interrogation Session: 20260129232009
Implantable Pulse Generator Implant Date: 20240117

## 2024-07-04 NOTE — Progress Notes (Signed)
 Remote Loop Recorder Transmission

## 2024-07-29 ENCOUNTER — Encounter

## 2024-07-31 ENCOUNTER — Ambulatory Visit

## 2024-07-31 ENCOUNTER — Encounter: Admitting: Internal Medicine

## 2024-08-31 ENCOUNTER — Ambulatory Visit

## 2024-10-01 ENCOUNTER — Ambulatory Visit

## 2024-11-01 ENCOUNTER — Ambulatory Visit

## 2024-12-02 ENCOUNTER — Ambulatory Visit

## 2025-01-02 ENCOUNTER — Ambulatory Visit

## 2025-02-02 ENCOUNTER — Ambulatory Visit

## 2025-02-14 ENCOUNTER — Ambulatory Visit

## 2025-03-05 ENCOUNTER — Ambulatory Visit

## 2025-04-05 ENCOUNTER — Ambulatory Visit

## 2025-05-06 ENCOUNTER — Ambulatory Visit

## 2025-06-06 ENCOUNTER — Ambulatory Visit
